# Patient Record
Sex: Female | Born: 1955 | Race: White | Hispanic: No | Marital: Single | Smoking: Never smoker
Health system: Southern US, Community
[De-identification: ages and names within clinical notes are randomized; demographics above are authoritative.]

## PROBLEM LIST (undated history)

## (undated) DIAGNOSIS — F79 Unspecified intellectual disabilities: Secondary | ICD-10-CM

## (undated) DIAGNOSIS — F209 Schizophrenia, unspecified: Secondary | ICD-10-CM

## (undated) DIAGNOSIS — I509 Heart failure, unspecified: Secondary | ICD-10-CM

## (undated) DIAGNOSIS — R1310 Dysphagia, unspecified: Secondary | ICD-10-CM

## (undated) DIAGNOSIS — E785 Hyperlipidemia, unspecified: Secondary | ICD-10-CM

## (undated) DIAGNOSIS — E669 Obesity, unspecified: Secondary | ICD-10-CM

## (undated) DIAGNOSIS — F419 Anxiety disorder, unspecified: Secondary | ICD-10-CM

## (undated) DIAGNOSIS — G219 Secondary parkinsonism, unspecified: Secondary | ICD-10-CM

## (undated) DIAGNOSIS — I10 Essential (primary) hypertension: Secondary | ICD-10-CM

## (undated) DIAGNOSIS — E559 Vitamin D deficiency, unspecified: Secondary | ICD-10-CM

## (undated) DIAGNOSIS — R569 Unspecified convulsions: Secondary | ICD-10-CM

## (undated) DIAGNOSIS — F0390 Unspecified dementia without behavioral disturbance: Secondary | ICD-10-CM

## (undated) HISTORY — DX: Unspecified convulsions: R56.9

## (undated) HISTORY — DX: Essential (primary) hypertension: I10

## (undated) HISTORY — DX: Obesity, unspecified: E66.9

## (undated) HISTORY — DX: Unspecified intellectual disabilities: F79

## (undated) HISTORY — DX: Unspecified dementia, unspecified severity, without behavioral disturbance, psychotic disturbance, mood disturbance, and anxiety: F03.90

## (undated) HISTORY — DX: Vitamin D deficiency, unspecified: E55.9

## (undated) HISTORY — DX: Anxiety disorder, unspecified: F41.9

## (undated) HISTORY — DX: Hyperlipidemia, unspecified: E78.5

## (undated) HISTORY — DX: Secondary parkinsonism, unspecified: G21.9

---

## 2003-04-29 ENCOUNTER — Ambulatory Visit (HOSPITAL_COMMUNITY): Admission: RE | Admit: 2003-04-29 | Discharge: 2003-04-29 | Payer: Self-pay | Admitting: Internal Medicine

## 2003-04-29 ENCOUNTER — Encounter: Payer: Self-pay | Admitting: Internal Medicine

## 2003-05-27 ENCOUNTER — Encounter: Payer: Self-pay | Admitting: Orthopedic Surgery

## 2003-05-27 ENCOUNTER — Ambulatory Visit: Admission: RE | Admit: 2003-05-27 | Discharge: 2003-05-27 | Payer: Self-pay | Admitting: Orthopedic Surgery

## 2007-12-08 ENCOUNTER — Emergency Department (HOSPITAL_COMMUNITY): Admission: EM | Admit: 2007-12-08 | Discharge: 2007-12-08 | Payer: Self-pay | Admitting: Emergency Medicine

## 2009-11-08 ENCOUNTER — Ambulatory Visit (HOSPITAL_COMMUNITY)
Admission: RE | Admit: 2009-11-08 | Discharge: 2009-11-08 | Payer: Self-pay | Source: Home / Self Care | Admitting: Internal Medicine

## 2011-06-17 LAB — BASIC METABOLIC PANEL
BUN: 13
CO2: 28
Chloride: 110
Creatinine, Ser: 0.52
Glucose, Bld: 104 — ABNORMAL HIGH

## 2011-06-17 LAB — URINE MICROSCOPIC-ADD ON

## 2011-06-17 LAB — URINALYSIS, ROUTINE W REFLEX MICROSCOPIC
Glucose, UA: NEGATIVE
Leukocytes, UA: NEGATIVE
Specific Gravity, Urine: >1.03 — ABNORMAL HIGH
Urobilinogen, UA: 0.2

## 2011-06-17 LAB — RAPID URINE DRUG SCREEN, HOSP PERFORMED
Barbiturates: POSITIVE — AB
Benzodiazepines: NOT DETECTED
Cocaine: NOT DETECTED
Opiates: NOT DETECTED

## 2011-06-17 LAB — DIFFERENTIAL
Basophils Relative: 1
Eosinophils Absolute: 0
Eosinophils Relative: 0
Neutrophils Relative %: 57

## 2011-06-17 LAB — ACETAMINOPHEN LEVEL: Acetaminophen (Tylenol), Serum: <10 — ABNORMAL LOW

## 2011-06-17 LAB — PHENOBARBITAL LEVEL: Phenobarbital: 34

## 2011-06-17 LAB — CBC
HCT: 37.6
MCHC: 35.4
MCV: 86.9
Platelets: 289

## 2014-01-06 ENCOUNTER — Encounter: Payer: Self-pay | Admitting: Adult Health

## 2014-01-06 ENCOUNTER — Ambulatory Visit (INDEPENDENT_AMBULATORY_CARE_PROVIDER_SITE_OTHER): Payer: PRIVATE HEALTH INSURANCE | Admitting: Adult Health

## 2014-01-06 VITALS — BP 120/82 | Ht 63.0 in | Wt 248.0 lb

## 2014-01-06 DIAGNOSIS — F79 Unspecified intellectual disabilities: Secondary | ICD-10-CM | POA: Insufficient documentation

## 2014-01-06 DIAGNOSIS — Z01419 Encounter for gynecological examination (general) (routine) without abnormal findings: Secondary | ICD-10-CM

## 2014-01-06 DIAGNOSIS — Z0000 Encounter for general adult medical examination without abnormal findings: Secondary | ICD-10-CM

## 2014-01-06 NOTE — Progress Notes (Signed)
Subjective:     Patient ID: Christine EagleDebra L Huisman, female   DOB: 11-16-1955, 58 y.o.   MRN: [id number]  HPI Stanton KidneyDebra is a 58 year old white female, single, who lives in [facility], she is here for a pap and physical.But she refused to undress and got loud and agitated with her caregiver.She had a mammogram 01/03/14, it took 2 visits to get it.She voices no complaints, except will not undress.  Review of Systems See HPI Reviewed past medical,surgical, social and family history. Reviewed medications and allergies, as best we could.     Objective:   Physical Exam BP 120/82  Ht 5\' 3"  (1.6 m)  Wt 248 lb (112.492 kg)  BMI 43.94 kg/m2   Skin warm and dry. Neck: mid line trachea, normal thyroid. Lungs: clear to ausculation bilaterally. Cardiovascular: regular rate and rhythm.Abdomen soft non tender and obese no HSM noted. I told caregiver I would not make her undress, will send 3 hemoccult cards home to do, and if she needs colonoscopy and sedated for that I could do pap then if PCP desires.  Assessment:     Limited physical exam Mental retardation     Plan:     Send 3 hemoccult cards home with instruction to return when complete Will be glad to see prn but am doubtful she will allow pelvic.

## 2014-01-06 NOTE — Patient Instructions (Signed)
Will be glad to see again but I doubt she will let a pelvic be preformed.

## 2014-02-16 ENCOUNTER — Ambulatory Visit: Payer: Medicare Other | Attending: Internal Medicine | Admitting: Occupational Therapy

## 2014-02-16 DIAGNOSIS — IMO0001 Reserved for inherently not codable concepts without codable children: Secondary | ICD-10-CM | POA: Diagnosis not present

## 2014-02-16 DIAGNOSIS — F79 Unspecified intellectual disabilities: Secondary | ICD-10-CM | POA: Insufficient documentation

## 2014-05-03 ENCOUNTER — Emergency Department (HOSPITAL_COMMUNITY): Payer: PRIVATE HEALTH INSURANCE

## 2014-05-03 ENCOUNTER — Inpatient Hospital Stay (HOSPITAL_COMMUNITY)
Admission: EM | Admit: 2014-05-03 | Discharge: 2014-05-05 | DRG: 101 | Disposition: A | Discharge status: Home / Self Care | Payer: PRIVATE HEALTH INSURANCE | Attending: Internal Medicine | Admitting: Internal Medicine

## 2014-05-03 ENCOUNTER — Encounter (HOSPITAL_COMMUNITY): Payer: Self-pay | Admitting: Emergency Medicine

## 2014-05-03 ENCOUNTER — Other Ambulatory Visit (HOSPITAL_COMMUNITY): Payer: Medicaid Other

## 2014-05-03 DIAGNOSIS — Z6841 Body Mass Index (BMI) 40.0 and over, adult: Secondary | ICD-10-CM | POA: Diagnosis not present

## 2014-05-03 DIAGNOSIS — IMO0002 Reserved for concepts with insufficient information to code with codable children: Secondary | ICD-10-CM | POA: Diagnosis present

## 2014-05-03 DIAGNOSIS — G40909 Epilepsy, unspecified, not intractable, without status epilepticus: Secondary | ICD-10-CM | POA: Diagnosis present

## 2014-05-03 DIAGNOSIS — F79 Unspecified intellectual disabilities: Secondary | ICD-10-CM | POA: Diagnosis present

## 2014-05-03 DIAGNOSIS — Z8249 Family history of ischemic heart disease and other diseases of the circulatory system: Secondary | ICD-10-CM | POA: Diagnosis not present

## 2014-05-03 DIAGNOSIS — I10 Essential (primary) hypertension: Secondary | ICD-10-CM | POA: Diagnosis present

## 2014-05-03 DIAGNOSIS — R569 Unspecified convulsions: Secondary | ICD-10-CM | POA: Diagnosis present

## 2014-05-03 LAB — URINALYSIS, ROUTINE W REFLEX MICROSCOPIC
BILIRUBIN URINE: NEGATIVE
GLUCOSE, UA: NEGATIVE mg/dL
KETONES UR: NEGATIVE mg/dL
Leukocytes, UA: NEGATIVE
Nitrite: NEGATIVE
PH: 6 (ref 5.0–8.0)
Protein, ur: 100 mg/dL — AB
Specific Gravity, Urine: 1.025 (ref 1.005–1.030)
Urobilinogen, UA: 0.2 mg/dL (ref 0.0–1.0)

## 2014-05-03 LAB — COMPREHENSIVE METABOLIC PANEL
ALBUMIN: 3.5 g/dL (ref 3.5–5.2)
ALK PHOS: 54 U/L (ref 39–117)
ALT: 9 U/L (ref 0–35)
AST: 13 U/L (ref 0–37)
Anion gap: 13 (ref 5–15)
BILIRUBIN TOTAL: 0.2 mg/dL — AB (ref 0.3–1.2)
BUN: 10 mg/dL (ref 6–23)
CHLORIDE: 105 meq/L (ref 96–112)
CO2: 26 mEq/L (ref 19–32)
Calcium: 9.3 mg/dL (ref 8.4–10.5)
Creatinine, Ser: 0.55 mg/dL (ref 0.50–1.10)
GFR calc Af Amer: >90 mL/min (ref >90)
GFR calc non Af Amer: >90 mL/min (ref >90)
Glucose, Bld: 89 mg/dL (ref 70–99)
POTASSIUM: 3.9 meq/L (ref 3.7–5.3)
Sodium: 144 mEq/L (ref 137–147)
Total Protein: 6.8 g/dL (ref 6.0–8.3)

## 2014-05-03 LAB — URINE MICROSCOPIC-ADD ON

## 2014-05-03 LAB — CBC WITH DIFFERENTIAL/PLATELET
BASOS ABS: 0 10*3/uL (ref 0.0–0.1)
BASOS PCT: 0 % (ref 0–1)
Eosinophils Absolute: 0 10*3/uL (ref 0.0–0.7)
Eosinophils Relative: 0 % (ref 0–5)
HCT: 36.2 % (ref 36.0–46.0)
HEMOGLOBIN: 12.4 g/dL (ref 12.0–15.0)
Lymphocytes Relative: 30 % (ref 12–46)
Lymphs Abs: 1.8 10*3/uL (ref 0.7–4.0)
MCH: 29.9 pg (ref 26.0–34.0)
MCHC: 34.3 g/dL (ref 30.0–36.0)
MCV: 87.2 fL (ref 78.0–100.0)
MONOS PCT: 6 % (ref 3–12)
Monocytes Absolute: 0.3 10*3/uL (ref 0.1–1.0)
NEUTROS ABS: 3.7 10*3/uL (ref 1.7–7.7)
NEUTROS PCT: 64 % (ref 43–77)
Platelets: 286 10*3/uL (ref 150–400)
RBC: 4.15 MIL/uL (ref 3.87–5.11)
RDW: 12.3 % (ref 11.5–15.5)
WBC: 5.8 10*3/uL (ref 4.0–10.5)

## 2014-05-03 LAB — AMMONIA: Ammonia: 27 umol/L (ref 11–60)

## 2014-05-03 LAB — MAGNESIUM: Magnesium: 1.9 mg/dL (ref 1.5–2.5)

## 2014-05-03 LAB — PHENOBARBITAL LEVEL: Phenobarbital: 27.2 ug/mL (ref 15.0–40.0)

## 2014-05-03 LAB — VALPROIC ACID LEVEL: Valproic Acid Lvl: <10 ug/mL — ABNORMAL LOW (ref 50.0–100.0)

## 2014-05-03 MED ORDER — BISACODYL 10 MG RE SUPP: 10.0000 mg | Freq: Every day | RECTAL | Status: DC | PRN

## 2014-05-03 MED ORDER — ENOXAPARIN SODIUM 40 MG/0.4ML ~~LOC~~ SOLN
40.0000 mg | SUBCUTANEOUS | Status: DC
  Administered 2014-05-03: 40 mg via SUBCUTANEOUS
  Filled 2014-05-03: qty 0.4

## 2014-05-03 MED ORDER — ONDANSETRON HCL 4 MG/2ML IJ SOLN: 4.0000 mg | Freq: Four times a day (QID) | INTRAMUSCULAR | Status: DC | PRN

## 2014-05-03 MED ORDER — ACETAMINOPHEN 325 MG PO TABS
650.0000 mg | ORAL_TABLET | Freq: Four times a day (QID) | ORAL | Status: DC | PRN
  Administered 2014-05-03 – 2014-05-04 (×2): 650 mg via ORAL
  Filled 2014-05-03 (×2): qty 2

## 2014-05-03 MED ORDER — SENNA 8.6 MG PO TABS
1.0000 | ORAL_TABLET | Freq: Two times a day (BID) | ORAL | Status: DC
  Administered 2014-05-03 – 2014-05-05 (×4): 8.6 mg via ORAL
  Filled 2014-05-03 (×4): qty 1

## 2014-05-03 MED ORDER — SIMVASTATIN 20 MG PO TABS
20.0000 mg | ORAL_TABLET | Freq: Every day | ORAL | Status: DC
  Administered 2014-05-03 – 2014-05-05 (×3): 20 mg via ORAL
  Filled 2014-05-03: qty 1
  Filled 2014-05-03: qty 2
  Filled 2014-05-03: qty 1

## 2014-05-03 MED ORDER — TRANDOLAPRIL 2 MG PO TABS
1.0000 mg | ORAL_TABLET | Freq: Every day | ORAL | Status: DC
  Administered 2014-05-03 – 2014-05-05 (×3): 1 mg via ORAL
  Filled 2014-05-03 (×3): qty 1

## 2014-05-03 MED ORDER — LEVETIRACETAM 500 MG PO TABS
500.0000 mg | ORAL_TABLET | Freq: Two times a day (BID) | ORAL | Status: DC
  Administered 2014-05-03 – 2014-05-05 (×4): 500 mg via ORAL
  Filled 2014-05-03 (×4): qty 1

## 2014-05-03 MED ORDER — ALUM & MAG HYDROXIDE-SIMETH 200-200-20 MG/5ML PO SUSP: 30.0000 mL | Freq: Four times a day (QID) | ORAL | Status: DC | PRN

## 2014-05-03 MED ORDER — PHENOBARBITAL 32.4 MG PO TABS
194.4000 mg | ORAL_TABLET | ORAL | Status: DC
  Administered 2014-05-03: 194.4 mg via ORAL
  Filled 2014-05-03 (×2): qty 6

## 2014-05-03 MED ORDER — OXYBUTYNIN CHLORIDE 5 MG PO TABS
5.0000 mg | ORAL_TABLET | Freq: Every day | ORAL | Status: DC
  Administered 2014-05-03 – 2014-05-05 (×3): 5 mg via ORAL
  Filled 2014-05-03 (×3): qty 1

## 2014-05-03 MED ORDER — SODIUM CHLORIDE 0.9 % IV SOLN
INTRAVENOUS | Status: AC
  Administered 2014-05-03: 17:00:00 via INTRAVENOUS

## 2014-05-03 MED ORDER — SODIUM CHLORIDE 0.9 % IJ SOLN
3.0000 mL | Freq: Two times a day (BID) | INTRAMUSCULAR | Status: DC
  Administered 2014-05-03 – 2014-05-05 (×2): 3 mL via INTRAVENOUS

## 2014-05-03 MED ORDER — LORAZEPAM 2 MG/ML IJ SOLN
1.0000 mg | INTRAMUSCULAR | Status: DC | PRN
  Administered 2014-05-04: 1 mg via INTRAVENOUS
  Filled 2014-05-03 (×2): qty 1

## 2014-05-03 MED ORDER — ACETAMINOPHEN 650 MG RE SUPP: 650.0000 mg | Freq: Four times a day (QID) | RECTAL | Status: DC | PRN

## 2014-05-03 MED ORDER — PHENOBARBITAL 32.4 MG PO TABS
129.6000 mg | ORAL_TABLET | ORAL | Status: DC
  Administered 2014-05-04: 129.6 mg via ORAL
  Filled 2014-05-03: qty 4

## 2014-05-03 MED ORDER — ONDANSETRON HCL 4 MG PO TABS: 4.0000 mg | ORAL_TABLET | Freq: Four times a day (QID) | ORAL | Status: DC | PRN

## 2014-05-03 MED ORDER — LEVETIRACETAM IN NACL 1000 MG/100ML IV SOLN
1000.0000 mg | Freq: Once | INTRAVENOUS | Status: AC
  Administered 2014-05-03: 1000 mg via INTRAVENOUS
  Filled 2014-05-03: qty 100

## 2014-05-03 MED ORDER — QUETIAPINE FUMARATE 25 MG PO TABS
50.0000 mg | ORAL_TABLET | Freq: Two times a day (BID) | ORAL | Status: DC
  Administered 2014-05-03 – 2014-05-05 (×4): 50 mg via ORAL
  Filled 2014-05-03 (×4): qty 2

## 2014-05-03 NOTE — ED Notes (Signed)
Report given to floor, pt. To go up in 15 minutes after finishing Keppra.

## 2014-05-03 NOTE — ED Notes (Signed)
Several attempts to call [facility] at [phone], also contact number listed in pt's chart at [phone] with no answer, was able to leave voice mail with nurse department at [facility].

## 2014-05-03 NOTE — ED Notes (Signed)
Lab at bedside for blood work.

## 2014-05-03 NOTE — ED Notes (Signed)
Pt is a resident of [facility] who was on the way to dr [facility] when pt had 3 seizures, per ems report pt had a seizure yesterday and was seen [facility], on arrival to er pt slow to answer questions, unsure of pt;s baseline, pt has hx of MR,

## 2014-05-03 NOTE — ED Notes (Signed)
Dr Zammit at bedside,  

## 2014-05-03 NOTE — ED Notes (Signed)
Pt alert, answering questions, update given,

## 2014-05-03 NOTE — H&P (Signed)
[facility] History and Physical  Christine Pena JXB:[id number] DOB: [birth year] DOA: 05/03/2014  Referring physician:  PCP: Colette Ribas, MD   Chief Complaint: seizure  HPI: Christine Pena is a 58 y.o. female with a past medical history that includes mental retardation, hypertension, seizure living in a group home, presents to the emergency department with the chief complaint of seizure. Information is obtained from the caregiver who is at the group home. Reportedly patient was on her way to [facility] for an appointment via a transportation service when she had 3 seizures that were witnessed by the driver. Driver describes patient as becoming lethargic with purple lips and drooling. Each episode lasted only 30 seconds to a minute. Caregiver reports patient had a seizure yesterday and was sent to [facility] emergency department and then discharged home. According to the caregiver there was a medication adjustment recently do to a elevated ammonia level. Family reports patient has not had a seizure since 2013. There is no report of recent illness no fever chills nausea vomiting diarrhea. No complaints of headache visual disturbances gait disturbances recent falls. Currently she's been eating and drinking her normal amount has been no unintentional weight loss.  Initial workup in the emergency department includes complete metabolic panel is unremarkable, complete blood count is unremarkable, CT of the head without acute abnormality chest x-ray with no active disease. Her phenobarbital level is 27.2. In the emergency department her vital signs are stable she is afebrile and not hypoxic appearing.  Review of Systems:  10 point review of systems completed and all systems are negative except as indicated in the history of present illness.   Past Medical History  Diagnosis Date  . Hypertension   . Seizures   . Mental retardation    History reviewed. No pertinent past surgical  history. Social History:  reports that she has never smoked. She has never used smokeless tobacco. She reports that she does not drink alcohol or use illicit drugs. History mental retardation. She attempts to make her wants and needs known. She can follow simple commands. She's been living in a group home the current group home since January 2015 Allergies  Allergen Reactions  . Haldol [Haloperidol Lactate]     Unknown reaction.    Family History  Problem Relation Age of Onset  . Heart disease Mother      Prior to Admission medications   Medication Sig Start Date End Date Taking? Authorizing Provider  moexipril (UNIVASC) 7.5 MG tablet Take 7.5 mg by mouth daily.  12/30/13  Yes Historical Provider, MD  oxybutynin (DITROPAN) 5 MG tablet Take 5 mg by mouth daily.  12/30/13  Yes Historical Provider, MD  PHENobarbital (LUMINAL) 64.8 MG tablet Take 129.6-194.4 mg by mouth See admin instructions. Takes 2 tablets at bedtime, alternating with 3 tablets the next night; repeat. 12/30/13  Yes Historical Provider, MD  QUEtiapine (SEROQUEL) 50 MG tablet Take 50 mg by mouth 2 (two) times daily.  12/30/13  Yes Historical Provider, MD  simvastatin (ZOCOR) 20 MG tablet Take 20 mg by mouth daily.  12/30/13  Yes Historical Provider, MD  Vitamin D, Ergocalciferol, (DRISDOL) 50000 UNITS CAPS capsule Take 50,000 Units by mouth every 7 (seven) days. Takes on Saturdays.   Yes Historical Provider, MD   Physical Exam: Filed Vitals:   05/03/14 0915 05/03/14 1015 05/03/14 1138 05/03/14 1238  BP: 139/84 101/73 129/78 140/79  Pulse: 100 79 81 79  Temp: 98.5 F (36.9 C)  TempSrc: Oral     Resp: 23 26 22 16   SpO2: 93%  95% 98%    Wt Readings from Last 3 Encounters:  01/06/14 112.492 kg (248 lb)    General:  Appears calm and comfortable Eyes: PERRL, normal lids, irises & conjunctiva ENT: Ears clear nose without drainage oropharynx without erythema or exudate. Mucous membranes of her mouth are pink but dry Neck: no  LAD, masses or thyromegaly Cardiovascular: RRR, no m/r/g. Trace to 1+ lower extremity edema nonpitting Telemetry: SR, no arrhythmias  Respiratory: CTA bilaterally, no w/r/r. Normal respiratory effort. Abdomen: soft, ntnd positive bowel sounds throughout no mass organomegaly noted Skin: no rash or induration seen on limited exam Musculoskeletal: grossly normal tone BUE/BLE  Neurologic: Follow simple commands speech somewhat unclear moves all extremities           Labs on Admission:  Basic Metabolic Panel:  Recent Labs Lab 05/03/14 0932  NA 144  K 3.9  CL 105  CO2 26  GLUCOSE 89  BUN 10  CREATININE 0.55  CALCIUM 9.3   Liver Function Tests:  Recent Labs Lab 05/03/14 0932  AST 13  ALT 9  ALKPHOS 54  BILITOT 0.2*  PROT 6.8  ALBUMIN 3.5   No results found for this basename: LIPASE, AMYLASE,  in the last 168 hours No results found for this basename: AMMONIA,  in the last 168 hours CBC:  Recent Labs Lab 05/03/14 0932  WBC 5.8  NEUTROABS 3.7  HGB 12.4  HCT 36.2  MCV 87.2  PLT 286   Cardiac Enzymes: No results found for this basename: CKTOTAL, CKMB, CKMBINDEX, TROPONINI,  in the last 168 hours  BNP (last 3 results) No results found for this basename: PROBNP,  in the last 8760 hours CBG: No results found for this basename: GLUCAP,  in the last 168 hours  Radiological Exams on Admission: Ct Head Wo Contrast  05/03/2014   CLINICAL DATA:  Seizure.  Altered mental status.  EXAM: CT HEAD WITHOUT CONTRAST  TECHNIQUE: Contiguous axial images were obtained from the base of the skull through the vertex without intravenous contrast.  COMPARISON:  Head CT scan 05/02/2014.  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 No evidence of acute intracranial abnormality including infarct, hemorrhage, mass lesion, mass effect, midline shift or abnormal extra-axial fluid collection is identified. There is no hydrocephalus or pneumocephalus. Imaged paranasal sinuses  and mastoid air cells are clear.  IMPRESSION: No acute abnormality.  Cerebellar atrophy.   Electronically Signed   By: Drusilla Kannerhomas  Dalessio M.D.   On: 05/03/2014 10:17   Dg Chest Portable 1 View  05/03/2014   CLINICAL DATA:  Seizures.  EXAM: PORTABLE CHEST - 1 VIEW  COMPARISON:  None.  FINDINGS: The lungs are clear. Heart size is upper normal. No pneumothorax or pleural effusion. No focal bony abnormality.  IMPRESSION: No acute disease.   Electronically Signed   By: Drusilla Kannerhomas  Dalessio M.D.   On: 05/03/2014 09:53    EKG:   Assessment/Plan Principal Problem:   Seizure: History of same. May be related to recent medication adjustment. Gout Prozac acid level less than 10.0. Phenobarbital level within the limits of normal. Will admit to telemetry. Will place on seizure precautions. She was given a loading dose of Keppra in the emergency department we will continue this twice a day. Will continue home phenobarbital dose Provide when necessary Ativan for any seizure activity Have requested an EEG and a neurology consult. Active Problems:  Mental retardation: Appears to be stable at baseline. Continue home medications    Hypertension: Fair control. Will continue her home ACE inhibitor. Monitor   Dr Jerre Simon neurology  Code Status: full DVT Prophylaxis: Family Communication: caregiver at bedside Disposition Plan: back to group home when ready  Time spent: 65 minutes  [facility] Pager [phone]  **Disclaimer: This note may have been dictated with voice recognition software. Similar sounding words can inadvertently be transcribed and this note may contain transcription errors which may not have been corrected upon publication of note.**

## 2014-05-03 NOTE — ED Provider Notes (Signed)
CSN: [id number]     Arrival date & time 05/03/14  0909 History  This chart was scribed for Christine LennertJoseph L Domingo Fuson, MD by Christine Pena, ED Scribe. This patient was seen in room APA02/APA02 and the patient's care was started 9:18 AM.    Chief Complaint  Patient presents with  . Seizures    Patient is a 58 y.o. female presenting with seizures. The history is provided by the EMS personnel. The history is limited by the condition of the patient. No language interpreter was used.  Seizures Seizure activity on arrival: no   Seizure type:  Unable to specify Initial focality:  Unable to specify Severity:  Moderate Timing:  Clustered Number of seizures this episode:  3 Progression:  Resolved History of seizures: yes     LEVEL 5 CAVEAT- SEIZURES, MENTAL RETARDATION HPI Comments: Christine Pena is a 58 y.o. female with past medical history of MR, seizures who presents from [facility] to the Emergency Department complaining of 3 seizures that occurred PTA. [place] [facility] [employer] when she had the seizures, which were witnessed by the driver. EMS was called to the scene and brought here. Per nurse, [facility] was seen [facility] for the same symptoms.    Past Medical History  Diagnosis Date  . Hypertension   . Seizures   . Mental retardation    History reviewed. No pertinent past surgical history. Family History  Problem Relation Age of Onset  . Heart disease Mother    History  Substance Use Topics  . Smoking status: Never Smoker   . Smokeless tobacco: Never Used  . Alcohol Use: No   OB History   Grav Para Term Preterm Abortions TAB SAB Ect Mult Living                 Review of Systems  Unable to perform ROS: Other      Allergies  Review of patient's allergies indicates no known allergies.  Home Medications   Prior to Admission medications   Medication Sig Start Date End Date Taking? Authorizing Provider   divalproex (DEPAKOTE) 250 MG DR tablet  12/30/13   Historical Provider, MD  furosemide (LASIX) 20 MG tablet  12/30/13   Historical Provider, MD  LORazepam (ATIVAN) 1 MG tablet  12/30/13   Historical Provider, MD  moexipril (UNIVASC) 7.5 MG tablet  12/30/13   Historical Provider, MD  oxybutynin (DITROPAN) 5 MG tablet  12/30/13   Historical Provider, MD  PHENobarbital (LUMINAL) 64.8 MG tablet  12/30/13   Historical Provider, MD  QUEtiapine (SEROQUEL) 50 MG tablet  12/30/13   Historical Provider, MD  simvastatin (ZOCOR) 20 MG tablet  12/30/13   Historical Provider, MD   BP 139/84  Pulse 100  Temp(Src) 98.5 F (36.9 C) (Oral)  Resp 23  SpO2 93% Physical Exam  Nursing note and vitals reviewed. Constitutional:  Lethargic.   HENT:  Head: Normocephalic.  Eyes: Conjunctivae and EOM are normal. No scleral icterus.  Neck: Neck supple. No thyromegaly present.  Cardiovascular: Normal rate and regular rhythm.  Exam reveals no gallop and no friction rub.   No murmur heard. Pulmonary/Chest: No stridor. She has no wheezes. She has no rales. She exhibits no tenderness.  Abdominal: She exhibits no distension. There is no tenderness. There is no rebound.  Musculoskeletal: Normal range of motion. She exhibits edema.  1+ edema to ankles bilaterally. Moves all extremities minimally.   Lymphadenopathy:  She has no cervical adenopathy.  Neurological: She exhibits normal muscle tone. Coordination normal.  Will follows commands and reacts to verbal stimuli   Skin: No rash noted. No erythema.  Psychiatric: She has a normal mood and affect. Her behavior is normal.    ED Course  Procedures (including critical care time)  DIAGNOSTIC STUDIES: Oxygen Saturation is 93% on RA, adequate by my interpretation.    COORDINATION OF CARE: 9:23 AM Will order head CT, CXR, and lab work.   12:14 PM Spoke with patient's caregiver who states patient was slowly taken off of Depakote due to elevated ammonia levels. Patient was  not started on a replacement medication and had a seizure yesterday in addition to the 3 today. Per caregiver, patient's last seizure was 2 years ago.   Labs Review Labs Reviewed - No data to display  Imaging Review No results found.   EKG Interpretation None      MDM   Final diagnoses:  None   Admit sz   The chart was scribed for me under my direct supervision.  I personally performed the history, physical, and medical decision making and all procedures in the evaluation of this patient.Christine Lennert, MD 05/03/14 1235

## 2014-05-03 NOTE — H&P (Signed)
Pt seen and examined with Ms. Vedia CofferBlack. Agree with assessment and plan as outlined above. Briefly, pt with a hx of mental retardation and known hx of seizures who [facility] with seizure activity after being weaned off Depakote secondary to elevated ammonia level. Pt remains therapeutic on phenytoin. Keppra started in ED. Agree with continuing scheduled keppra for now and ask Neurology to assist with med adjustment.

## 2014-05-03 NOTE — Consult Note (Signed)
[place] A. Merlene Laughter, MD     [email or web address]          Christine Pena is an 58 y.o. female.   ASSESSMENT/PLAN: 1. Resolved status epilepticus after the discontinuation of Depakote. The patient has been started on Keppra. Given her baseline cognitive impairment/mental retardation and Behavioral problem, she has a higher risk of developing behavioral problems with the Keppra. We will have to observe she also does with this. In meantime, he will continue with the [place] for now. If she cannot tolerate the Keppra she may do better with a more broad spectrum medication that also can stabilize her mood. Lamictal would be a good option. We will continue with the phenobarbital. An EEG will be obtained.  2. Marked mental retardation at baseline. Continue with her baseline psychotropic medications.  3. Depakote-induced elevation of ammonia level. The Depakote has been discontinued.  This is a 58 year old white female who has a baseline history of epilepsy. She apparently has been well controlled with no seizures in 2013. The chart. She stayed at the group home facility. It appears that the patient Depakote was discontinued after she presented with elevated ammonia level at [facility]. Unfortunately, she had a few seizures on admission today. She was loaded with Keppra and so far has tolerated the medication. She has marked cognitive impairment at baseline and therefore history cannot be obtained. It appears that she has she had a flurry of seizures while she was being transported for a regular appointment at [facility]. The patient has been somewhat irritable and pulled out her IV while hospitalized. She currently has a Actuary with her.  GENERAL: Obese female who seen up in the chair.   HEENT: Supple. Atraumatic normocephalic.   ABDOMEN: soft  EXTREMITIES: Significant nonpitting edema of the legs.   BACK: Normal.  SKIN: Normal by inspection.    MENTAL  STATUS: She is awake and alert. She has severe dysarthria. She speaks only 2 word sentences and stated that she is doing well. She does follow commands. Otherwise she cannot answer any questions.  CRANIAL NERVES: Pupils are equal, round and reactive to light and accommodation; extra ocular movements are full, there is no significant nystagmus; visual fields are full; upper and lower facial muscles are normal in strength and symmetric, there is no flattening of the nasolabial folds; tongue is midline; uvula is midline; shoulder elevation is normal. Visual fields somewhat limited on examination but appears to be full.  MOTOR: Exact strength is not possible because of cognitive issues but she has at least 4+ in the upper extremities and antigravity in the legs.  COORDINATION: No tremors are noted. There is no dysmetria.  REFLEXES: Deep tendon reflexes are symmetrical and normal. Babinski reflexes are flexor bilaterally.   SENSATION: Normal to Pain.      Past Medical History  Diagnosis Date  . Hypertension   . Seizures   . Mental retardation     History reviewed. No pertinent past surgical history.  Family History  Problem Relation Age of Onset  . Heart disease Mother     Social History:  reports that she has never smoked. She has never used smokeless tobacco. She reports that she does not drink alcohol or use illicit drugs.  Allergies:  Allergies  Allergen Reactions  . Haldol [Haloperidol Lactate]     Unknown reaction.    Medications: Prior to Admission medications   Medication Sig Start Date End Date Taking? Authorizing Provider  moexipril (UNIVASC) 7.5  MG tablet Take 7.5 mg by mouth daily.  12/30/13  Yes Historical Provider, MD  oxybutynin (DITROPAN) 5 MG tablet Take 5 mg by mouth daily.  12/30/13  Yes Historical Provider, MD  PHENobarbital (LUMINAL) 64.8 MG tablet Take 129.6-194.4 mg by mouth See admin instructions. Takes 2 tablets at bedtime, alternating with 3 tablets the  next night; repeat. 12/30/13  Yes Historical Provider, MD  QUEtiapine (SEROQUEL) 50 MG tablet Take 50 mg by mouth 2 (two) times daily.  12/30/13  Yes Historical Provider, MD  simvastatin (ZOCOR) 20 MG tablet Take 20 mg by mouth daily.  12/30/13  Yes Historical Provider, MD  Vitamin D, Ergocalciferol, (DRISDOL) 50000 UNITS CAPS capsule Take 50,000 Units by mouth every 7 (seven) days. Takes on Saturdays.   Yes Historical Provider, MD    Scheduled Meds: . enoxaparin (LOVENOX) injection  40 mg Subcutaneous Q24H  . levETIRAcetam  500 mg Oral BID  . oxybutynin  5 mg Oral Daily  . [START ON 05/04/2014] phenobarbital  129.6 mg Oral Q48H  . PHENobarbital  194.4 mg Oral Q48H  . QUEtiapine  50 mg Oral BID  . senna  1 tablet Oral BID  . simvastatin  20 mg Oral Daily  . sodium chloride  3 mL Intravenous Q12H  . trandolapril  1 mg Oral Daily   Continuous Infusions: . sodium chloride 50 mL/hr at 05/03/14 1705   PRN Meds:.acetaminophen, acetaminophen, alum & mag hydroxide-simeth, bisacodyl, LORazepam, ondansetron (ZOFRAN) IV, ondansetron   Blood pressure 153/64, pulse 74, temperature 97.9 F (36.6 C), temperature source Oral, resp. rate 18, SpO2 100.00%.   Results for orders placed during the hospital encounter of 05/03/14 (from the past 48 hour(s))  MAGNESIUM     Status: None   Collection Time    05/03/14  9:23 AM      Result Value Ref Range   Magnesium 1.9  1.5 - 2.5 mg/dL  CBC WITH DIFFERENTIAL     Status: None   Collection Time    05/03/14  9:32 AM      Result Value Ref Range   WBC 5.8  4.0 - 10.5 K/uL   RBC 4.15  3.87 - 5.11 MIL/uL   Hemoglobin 12.4  12.0 - 15.0 g/dL   HCT 36.2  36.0 - 46.0 %   MCV 87.2  78.0 - 100.0 fL   MCH 29.9  26.0 - 34.0 pg   MCHC 34.3  30.0 - 36.0 g/dL   RDW 12.3  11.5 - 15.5 %   Platelets 286  150 - 400 K/uL   Neutrophils Relative % 64  43 - 77 %   Neutro Abs 3.7  1.7 - 7.7 K/uL   Lymphocytes Relative 30  12 - 46 %   Lymphs Abs 1.8  0.7 - 4.0 K/uL   Monocytes  Relative 6  3 - 12 %   Monocytes Absolute 0.3  0.1 - 1.0 K/uL   Eosinophils Relative 0  0 - 5 %   Eosinophils Absolute 0.0  0.0 - 0.7 K/uL   Basophils Relative 0  0 - 1 %   Basophils Absolute 0.0  0.0 - 0.1 K/uL  COMPREHENSIVE METABOLIC PANEL     Status: Abnormal   Collection Time    05/03/14  9:32 AM      Result Value Ref Range   Sodium 144  137 - 147 mEq/L   Potassium 3.9  3.7 - 5.3 mEq/L   Chloride 105  96 - 112 mEq/L   CO2  26  19 - 32 mEq/L   Glucose, Bld 89  70 - 99 mg/dL   BUN 10  6 - 23 mg/dL   Creatinine, Ser 0.55  0.50 - 1.10 mg/dL   Calcium 9.3  8.4 - 10.5 mg/dL   Total Protein 6.8  6.0 - 8.3 g/dL   Albumin 3.5  3.5 - 5.2 g/dL   AST 13  0 - 37 U/L   ALT 9  0 - 35 U/L   Alkaline Phosphatase 54  39 - 117 U/L   Total Bilirubin 0.2 (*) 0.3 - 1.2 mg/dL   GFR calc non Af Amer >90  >90 mL/min   GFR calc Af Amer >90  >90 mL/min   Comment: (NOTE)     The eGFR has been calculated using the CKD EPI equation.     This calculation has not been validated in all clinical situations.     eGFR's persistently <90 mL/min signify possible Chronic Kidney     Disease.   Anion gap 13  5 - 15  PHENOBARBITAL LEVEL     Status: None   Collection Time    05/03/14  9:32 AM      Result Value Ref Range   Phenobarbital 27.2  15.0 - 40.0 ug/mL  VALPROIC ACID LEVEL     Status: Abnormal   Collection Time    05/03/14  9:32 AM      Result Value Ref Range   Valproic Acid Lvl <10.0 (*) 50.0 - 100.0 ug/mL  URINALYSIS, ROUTINE W REFLEX MICROSCOPIC     Status: Abnormal   Collection Time    05/03/14  9:46 AM      Result Value Ref Range   Color, Urine YELLOW  YELLOW   APPearance CLEAR  CLEAR   Specific Gravity, Urine 1.025  1.005 - 1.030   pH 6.0  5.0 - 8.0   Glucose, UA NEGATIVE  NEGATIVE mg/dL   Hgb urine dipstick MODERATE (*) NEGATIVE   Bilirubin Urine NEGATIVE  NEGATIVE   Ketones, ur NEGATIVE  NEGATIVE mg/dL   Protein, ur 100 (*) NEGATIVE mg/dL   Urobilinogen, UA 0.2  0.0 - 1.0 mg/dL    Nitrite NEGATIVE  NEGATIVE   Leukocytes, UA NEGATIVE  NEGATIVE  URINE MICROSCOPIC-ADD ON     Status: Abnormal   Collection Time    05/03/14  9:46 AM      Result Value Ref Range   Squamous Epithelial / LPF FEW (*) RARE   RBC / HPF 7-10  <3 RBC/hpf   Bacteria, UA FEW (*) RARE  AMMONIA     Status: None   Collection Time    05/03/14  4:13 PM      Result Value Ref Range   Ammonia 27  11 - 60 umol/L    Ct Head Wo Contrast  05/03/2014   CLINICAL DATA:  Seizure.  Altered mental status.  EXAM: CT HEAD WITHOUT CONTRAST  TECHNIQUE: Contiguous axial images were obtained from the base of the skull through the vertex without intravenous contrast.  COMPARISON:  Head CT scan 05/02/2014.  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 No evidence of acute intracranial abnormality including infarct, hemorrhage, mass lesion, mass effect, midline shift or abnormal extra-axial fluid collection is identified. There is no hydrocephalus or pneumocephalus. Imaged paranasal sinuses and mastoid air cells are clear.  IMPRESSION: No acute abnormality.  Cerebellar atrophy.   Electronically Signed   By: Inge Rise M.D.   On: 05/03/2014  10:17   Dg Chest Portable 1 View  05/03/2014   CLINICAL DATA:  Seizures.  EXAM: PORTABLE CHEST - 1 VIEW  COMPARISON:  None.  FINDINGS: The lungs are clear. Heart size is upper normal. No pneumothorax or pleural effusion. No focal bony abnormality.  IMPRESSION: No acute disease.   Electronically Signed   By: Inge Rise M.D.   On: 05/03/2014 09:53        Johnney Scarlata A. Merlene Laughter, M.D.  Diplomate, Tax adviser of Psychiatry and Neurology ( Neurology). 05/03/2014, 7:36 PM

## 2014-05-03 NOTE — ED Notes (Signed)
Hx of seziures and MR - on the way to MD appt when had 3 witnessed seizures by driver.  [facility].  Pt alert at this time.

## 2014-05-04 ENCOUNTER — Other Ambulatory Visit (HOSPITAL_COMMUNITY): Payer: Medicaid Other

## 2014-05-04 LAB — TSH: TSH: 2.8 u[IU]/mL (ref 0.350–4.500)

## 2014-05-04 MED ORDER — LAMOTRIGINE 25 MG PO TABS
25.0000 mg | ORAL_TABLET | Freq: Two times a day (BID) | ORAL | Status: DC
  Administered 2014-05-04 – 2014-05-05 (×2): 25 mg via ORAL
  Filled 2014-05-04 (×4): qty 1

## 2014-05-04 MED ORDER — LAMOTRIGINE 25 MG PO TABS
ORAL_TABLET | ORAL | Status: AC
  Filled 2014-05-04: qty 1

## 2014-05-04 NOTE — Care Management Note (Signed)
    Page 1 of 1   05/05/2014     11:40:36 AM CARE MANAGEMENT NOTE 05/05/2014  Patient:  Christine Pena,Christine Pena   Account Number:  [id number]  Date Initiated:  05/04/2014  Documentation initiated by:  Anibal HendersonBOLDEN,Kadir Azucena  Subjective/Objective Assessment:   Admitted with seizures. Pt is from [facility], and will return there at d/c. CSW aware, and will facilitate the D/C     Action/Plan:   No CM needs identified at this time, but will follow   Anticipated DC Date:  05/05/2014   Anticipated DC Plan:  GROUP HOME      DC Planning Services  CM consult      Choice offered to / List presented to:             Status of service:  Completed, signed off Medicare Important Message given?   (If response is "NO", the following Medicare IM given date fields will be blank) Date Medicare IM given:   Medicare IM given by:   Date Additional Medicare IM given:   Additional Medicare IM given by:    Discharge Disposition:  REST HOME  Per UR Regulation:  Reviewed for med. necessity/level of care/duration of stay  If discussed at Long Length of Stay Meetings, dates discussed:    Comments:  05/05/14 1130 Anibal HendersonGeneva Davidlee Jeanbaptiste RN/CM  Pt D/C back to group home 05/04/14 1400 Anibal HendersonGeneva Rondall Radigan RN/CM

## 2014-05-04 NOTE — Clinical Social Work Note (Signed)
Patient is resident of [facility], [place].  [phone] Christine Birkenheadx32, Christine Pena.  [name], LCSW Clinical Social Worker [phone])

## 2014-05-04 NOTE — Progress Notes (Signed)
[facility] PROGRESS NOTE  Christine Pena ZOX:[id number] DOB: Feb 07, 1956 DOA: 05/03/2014 PCP: Colette Ribas, MD  Assessment/Plan: Principal Problem:  Seizure: History of same. May be related to recent medication adjustment. valproic acid level less than 10.0 on admission. Phenobarbital level within the limits of normal. Appreciate Dr Gerilyn Pilgrim assistance. Continue keppra and home phenobarbital dose. No further seizure activity. Per Dr Gerilyn Pilgrim may need to consider lamictal as Keppra may increase risk of behavioral problems.  Await EEG results. Ativan as needed for seizure  Active Problems:  Mental retardation: Appears to be stable at baseline. She does holler out and pulls at IV etc. Easily redirected. Sitter in room. Instructed sitter to engage patient in active way to help keep her calm.  Continue home medications   Hypertension: Fair control. Will continue her home ACE inhibitor. Monitor   Code Status: full Family Communication: none present Disposition Plan: back to group home when ready   Consultants:  Dr Gerilyn Pilgrim neurolgy  Procedures:  EEG  Antibiotics:  none  HPI/Subjective: Sitting up in bed. Hollering but easily calmed. Reports feeling scared but denies pain.  Objective: Filed Vitals:   05/04/14 0643  BP: 119/92  Pulse: 63  Temp: 98 F (36.7 C)  Resp: 20    Intake/Output Summary (Last 24 hours) at 05/04/14 1048 Last data filed at 05/04/14 0941  Gross per 24 hour  Intake   1080 ml  Output   2175 ml  Net  -1095 ml   There were no vitals filed for this visit.  Exam:   General:  Obese somewhat anxious NAD  Cardiovascular: RRR No MGR trace LE edema  Respiratory: normal effort BS clear bilaterally no wheeze  Abdomen: obese soft +BS non-tender to palpation  Musculoskeletal: no clubbing or cyanosis  Neuro: oriented to self. Follows commands. Able to make needs known   Data Reviewed: Basic Metabolic Panel:  Recent Labs Lab  05/03/14 0923 05/03/14 0932  NA  --  144  K  --  3.9  CL  --  105  CO2  --  26  GLUCOSE  --  89  BUN  --  10  CREATININE  --  0.55  CALCIUM  --  9.3  MG 1.9  --    Liver Function Tests:  Recent Labs Lab 05/03/14 0932  AST 13  ALT 9  ALKPHOS 54  BILITOT 0.2*  PROT 6.8  ALBUMIN 3.5   No results found for this basename: LIPASE, AMYLASE,  in the last 168 hours  Recent Labs Lab 05/03/14 1613  AMMONIA 27   CBC:  Recent Labs Lab 05/03/14 0932  WBC 5.8  NEUTROABS 3.7  HGB 12.4  HCT 36.2  MCV 87.2  PLT 286   Cardiac Enzymes: No results found for this basename: CKTOTAL, CKMB, CKMBINDEX, TROPONINI,  in the last 168 hours BNP (last 3 results) No results found for this basename: PROBNP,  in the last 8760 hours CBG: No results found for this basename: GLUCAP,  in the last 168 hours  No results found for this or any previous visit (from the past 240 hour(s)).   Studies: Ct Head Wo Contrast  05/03/2014   CLINICAL DATA:  Seizure.  Altered mental status.  EXAM: CT HEAD WITHOUT CONTRAST  TECHNIQUE: Contiguous axial images were obtained from the base of the skull through the vertex without intravenous contrast.  COMPARISON:  Head CT scan 05/02/2014.  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 No evidence of acute  intracranial abnormality including infarct, hemorrhage, mass lesion, mass effect, midline shift or abnormal extra-axial fluid collection is identified. There is no hydrocephalus or pneumocephalus. Imaged paranasal sinuses and mastoid air cells are clear.  IMPRESSION: No acute abnormality.  Cerebellar atrophy.   Electronically Signed   By: Drusilla Kannerhomas  Dalessio M.D.   On: 05/03/2014 10:17   Dg Chest Portable 1 View  05/03/2014   CLINICAL DATA:  Seizures.  EXAM: PORTABLE CHEST - 1 VIEW  COMPARISON:  None.  FINDINGS: The lungs are clear. Heart size is upper normal. No pneumothorax or pleural effusion. No focal bony abnormality.  IMPRESSION: No acute  disease.   Electronically Signed   By: Drusilla Kannerhomas  Dalessio M.D.   On: 05/03/2014 09:53    Scheduled Meds: . enoxaparin (LOVENOX) injection  40 mg Subcutaneous Q24H  . levETIRAcetam  500 mg Oral BID  . oxybutynin  5 mg Oral Daily  . phenobarbital  129.6 mg Oral Q48H  . PHENobarbital  194.4 mg Oral Q48H  . QUEtiapine  50 mg Oral BID  . senna  1 tablet Oral BID  . simvastatin  20 mg Oral Daily  . sodium chloride  3 mL Intravenous Q12H  . trandolapril  1 mg Oral Daily   Continuous Infusions:   Principal Problem:   Seizure Active Problems:   Mental retardation   Hypertension    Time spent: 35 minutes    [facility] TennesseeBLACK,Chianne Byrns M  [facility] Pager [phone]. If 7PM-7AM, please contact night-coverage at [email or web address], password [email or web address] 05/04/2014, 10:48 AM  LOS: 1 day

## 2014-05-04 NOTE — Clinical Social Work Note (Signed)
Clinical Social Work Department BRIEF PSYCHOSOCIAL ASSESSMENT 05/04/2014  Patient:  Christine Pena,Christine Pena     Account Number:  [id number]     Admit date:  05/03/2014  Clinical Social Worker:  Santa GeneraUNNINGHAM,Ellice Boultinghouse, CLINICAL SOCIAL WORKER  Date/Time:  05/04/2014 02:00 PM  Referred by:  Physician  Date Referred:  05/04/2014 Referred for  ALF Placement   Other Referral:   Interview type:  Other - See comment Other interview type:   Family unavailable, information obtained from Christine Pena, Group Home manager    PSYCHOSOCIAL DATA Living Status:  FACILITY Admitted from facility:  Other Level of care:  Group Home Primary support name:  Christine Pena Primary support relationship to patient:  PARENT Degree of support available:   Christine Pena, Christine Pena, is patient's legal guardian.  Christine Pena is currently at [facility], patient was placed at [facility] 10/18/13.  Has brothers Christine Pena and Christine Pena who visit regularly.    CURRENT CONCERNS Current Concerns  Post-Acute Placement   Other Concerns:    SOCIAL WORK ASSESSMENT / PLAN CSW unable to assess patient directly, patient oriented to self only.  CSW contacted Christine Pena, Production designer, theatre/television/filmadministrator of [facility] where patient currently lives. Patient lived w Christine Pena until 10-18-13 when she was admitted to [facility].  Christine Pena is elderly and had a fall, was [facility] and [facility]. Brothers Christine Pena and Christine Pena are both involved and supportive, bring Christine Pena from SNF to [facility] for weekly visit.  Christine Pena is patient's legal guardian at present, but brothers have been decision makers for patient's care at [facility] since Christine Pena/guardian has been unable to be physically present for patient.    At group home, patient has been able to ambulate and transfer without assistance.  She does have an "unusual gait" and staff walk alongside her when she walks from group home to sheltered work place on property daily. Patient eats family style, is able  to feed herself.  Per [facility] staff, patient has made great progress while in placement and she has been taught to dress herself, toilet herself and feed herself.  Staff do assist w bathing. Patient participates in "senior exercise program" of mild chair exercises and goes on outings w group home.  Staff have noted that her right leg "shakes" but "that doesn't mean that she is going to have a seizure."    [place] [phone]).  His PA is Berton BonMorrow Dowdle, either provider is able to provide information about patient if needed.    [facility] is willing to take patient back at discharge and expressed no concerns.  [facility] was encouraged to come assess patient to determine if her level of assistance had changed from baseline.  However, facility believes that they are able to manage patient at present.  Will provide transportation for return to facility.  Contact Christine Pena [phone]) with regular updates.   Assessment/plan status:  Psychosocial Support/Ongoing Assessment of Needs Other assessment/ plan:   Information/referral to community resources:   [facility]    PATIENT'S/FAMILY'S RESPONSE TO PLAN OF CARE: Unable to assess patient or family, will continue totry to contact brother.       Santa GeneraAnne Newel Oien, LCSW Clinical Social Worker [phone])

## 2014-05-04 NOTE — Clinical Social Work Note (Signed)
CSW spoke w brother, Christine Pena. Confirmed that family would like patient to return to [facility] at discharge, expressed no concerns about living situation.  Says that mother is aware that patient is in the hospital.  Says to his knowledge, patient's last seizure was approx 5 years ago around the time her father died.  Would like to be updated when patient is ready for discharge.  Santa GeneraAnne Kimetha Trulson, LCSW Clinical Social Worker [phone])

## 2014-05-04 NOTE — Care Management Utilization Note (Signed)
UR completed 

## 2014-05-04 NOTE — Progress Notes (Signed)
EEG could not be performed due to patient. Patient would take electrodes off after tech placed them, nurse tech in room during entire procedure and was told that EEG will be tried again tomorrow afternoon 05/05/14

## 2014-05-04 NOTE — Progress Notes (Signed)
Patient seen and examined. Note reviewed.   patient was admitted with breakthrough seizures after Depakote was discontinued. She is continued on phenobarbital at her home dose. Riser added to her antibiotic regimen. Appreciate nephrology assistance. EEG was attempted today, but patient did not allow this. She was repeatedly removing leads from her forehead. This may be challenging due to her underlying mental retardation and anxiety. She's not any further seizures while in the hospital. Anticipate that she'll be ready for discharge in the next 24-48 hours.  Christine Pena

## 2014-05-04 NOTE — Progress Notes (Signed)
Patient ID: TASIA LIZ, female   DOB: [birth year], 58 y.o.   MRN: [id number]  Lorain A. Merlene Laughter, MD     [email or web address]          CALYSSA ZOBRIST is an 58 y.o. female.   Assessment/Plan: 1. Resolved status epilepticus after the discontinuation of Depakote. The patient has been started on Keppra. Given her baseline cognitive impairment/mental retardation and Behavioral problem, she has a higher risk of developing behavioral problems with the Keppra. Given the behavioral problems, we will start the patient on Lamictal. We will continue with the phenobarbital and Keppra for now although long-term I think we will gradually wean her off the [place] when she is on a therapeutic dose of the Lamictal.  2. Marked mental retardation at baseline. Continue with her baseline psychotropic medications.  3. Depakote-induced elevation of ammonia level. The Depakote has been discontinued.   The patient has had some behavioral issues today. She pulled out her IV and also was cursing and the nurses. EEG was attempted but she was not cooperative with this.  GENERAL: Obese female who seen up in the chair.  HEENT: Supple. Atraumatic normocephalic.  ABDOMEN: soft  EXTREMITIES: Significant nonpitting edema of the legs.  BACK: Normal.  SKIN: Normal by inspection.  MENTAL STATUS: She is awake and alert. She has severe dysarthria. She speaks only 2 word sentences and stated that she is doing well. She does follow commands. Otherwise she cannot answer any questions.  CRANIAL NERVES: Pupils are equal, round and reactive to light and accommodation; extra ocular movements are full, there is no significant nystagmus; visual fields are full; upper and lower facial muscles are normal in strength and symmetric, there is no flattening of the nasolabial folds; tongue is midline; uvula is midline; shoulder elevation is normal. Visual fields somewhat limited on examination but appears to be full.    MOTOR: Exact strength is not possible because of cognitive issues but she has at least 4+ in the upper extremities and antigravity in the legs.  COORDINATION: No tremors are noted. There is no dysmetria.         Objective: Vital signs in last 24 hours: Temp:  [98 F (36.7 C)-98.2 F (36.8 C)] 98.2 F (36.8 C) (08/12 1528) Pulse Rate:  [63-73] 73 (08/12 1528) Resp:  [20] 20 (08/12 1528) BP: (119-144)/(84-92) 144/84 mmHg (08/12 1528) SpO2:  [97 %-100 %] 100 % (08/12 1528)  Intake/Output from previous day: 08/11 0701 - 08/12 0700 In: 480 [P.O.:480] Out: 1000 [Urine:1000] Intake/Output this shift:   Nutritional status: Cardiac   Lab Results: Results for orders placed during the hospital encounter of 05/03/14 (from the past 48 hour(s))  MAGNESIUM     Status: None   Collection Time    05/03/14  9:23 AM      Result Value Ref Range   Magnesium 1.9  1.5 - 2.5 mg/dL  TSH     Status: None   Collection Time    05/03/14  9:23 AM      Result Value Ref Range   TSH 2.800  0.350 - 4.500 uIU/mL   Comment: Performed at [facility]     Status: None   Collection Time    05/03/14  9:32 AM      Result Value Ref Range   WBC 5.8  4.0 - 10.5 K/uL   RBC 4.15  3.87 - 5.11 MIL/uL   Hemoglobin 12.4  12.0 - 15.0 g/dL  HCT 36.2  36.0 - 46.0 %   MCV 87.2  78.0 - 100.0 fL   MCH 29.9  26.0 - 34.0 pg   MCHC 34.3  30.0 - 36.0 g/dL   RDW 12.3  11.5 - 15.5 %   Platelets 286  150 - 400 K/uL   Neutrophils Relative % 64  43 - 77 %   Neutro Abs 3.7  1.7 - 7.7 K/uL   Lymphocytes Relative 30  12 - 46 %   Lymphs Abs 1.8  0.7 - 4.0 K/uL   Monocytes Relative 6  3 - 12 %   Monocytes Absolute 0.3  0.1 - 1.0 K/uL   Eosinophils Relative 0  0 - 5 %   Eosinophils Absolute 0.0  0.0 - 0.7 K/uL   Basophils Relative 0  0 - 1 %   Basophils Absolute 0.0  0.0 - 0.1 K/uL  COMPREHENSIVE METABOLIC PANEL     Status: Abnormal   Collection Time    05/03/14  9:32 AM      Result  Value Ref Range   Sodium 144  137 - 147 mEq/L   Potassium 3.9  3.7 - 5.3 mEq/L   Chloride 105  96 - 112 mEq/L   CO2 26  19 - 32 mEq/L   Glucose, Bld 89  70 - 99 mg/dL   BUN 10  6 - 23 mg/dL   Creatinine, Ser 0.55  0.50 - 1.10 mg/dL   Calcium 9.3  8.4 - 10.5 mg/dL   Total Protein 6.8  6.0 - 8.3 g/dL   Albumin 3.5  3.5 - 5.2 g/dL   AST 13  0 - 37 U/L   ALT 9  0 - 35 U/L   Alkaline Phosphatase 54  39 - 117 U/L   Total Bilirubin 0.2 (*) 0.3 - 1.2 mg/dL   GFR calc non Af Amer >90  >90 mL/min   GFR calc Af Amer >90  >90 mL/min   Comment: (NOTE)     The eGFR has been calculated using the CKD EPI equation.     This calculation has not been validated in all clinical situations.     eGFR's persistently <90 mL/min signify possible Chronic Kidney     Disease.   Anion gap 13  5 - 15  PHENOBARBITAL LEVEL     Status: None   Collection Time    05/03/14  9:32 AM      Result Value Ref Range   Phenobarbital 27.2  15.0 - 40.0 ug/mL  VALPROIC ACID LEVEL     Status: Abnormal   Collection Time    05/03/14  9:32 AM      Result Value Ref Range   Valproic Acid Lvl <10.0 (*) 50.0 - 100.0 ug/mL  URINALYSIS, ROUTINE W REFLEX MICROSCOPIC     Status: Abnormal   Collection Time    05/03/14  9:46 AM      Result Value Ref Range   Color, Urine YELLOW  YELLOW   APPearance CLEAR  CLEAR   Specific Gravity, Urine 1.025  1.005 - 1.030   pH 6.0  5.0 - 8.0   Glucose, UA NEGATIVE  NEGATIVE mg/dL   Hgb urine dipstick MODERATE (*) NEGATIVE   Bilirubin Urine NEGATIVE  NEGATIVE   Ketones, ur NEGATIVE  NEGATIVE mg/dL   Protein, ur 100 (*) NEGATIVE mg/dL   Urobilinogen, UA 0.2  0.0 - 1.0 mg/dL   Nitrite NEGATIVE  NEGATIVE   Leukocytes, UA NEGATIVE  NEGATIVE  URINE  MICROSCOPIC-ADD ON     Status: Abnormal   Collection Time    05/03/14  9:46 AM      Result Value Ref Range   Squamous Epithelial / LPF FEW (*) RARE   RBC / HPF 7-10  <3 RBC/hpf   Bacteria, UA FEW (*) RARE  AMMONIA     Status: None   Collection Time     05/03/14  4:13 PM      Result Value Ref Range   Ammonia 27  11 - 60 umol/L    Lipid Panel No results found for this basename: CHOL, TRIG, HDL, CHOLHDL, VLDL, LDLCALC,  in the last 72 hours  Studies/Results: Ct Head Wo Contrast  05/03/2014   CLINICAL DATA:  Seizure.  Altered mental status.  EXAM: CT HEAD WITHOUT CONTRAST  TECHNIQUE: Contiguous axial images were obtained from the base of the skull through the vertex without intravenous contrast.  COMPARISON:  Head CT scan 05/02/2014.  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 No evidence of acute intracranial abnormality including infarct, hemorrhage, mass lesion, mass effect, midline shift or abnormal extra-axial fluid collection is identified. There is no hydrocephalus or pneumocephalus. Imaged paranasal sinuses and mastoid air cells are clear.  IMPRESSION: No acute abnormality.  Cerebellar atrophy.   Electronically Signed   By: Inge Rise M.D.   On: 05/03/2014 10:17   Dg Chest Portable 1 View  05/03/2014   CLINICAL DATA:  Seizures.  EXAM: PORTABLE CHEST - 1 VIEW  COMPARISON:  None.  FINDINGS: The lungs are clear. Heart size is upper normal. No pneumothorax or pleural effusion. No focal bony abnormality.  IMPRESSION: No acute disease.   Electronically Signed   By: Inge Rise M.D.   On: 05/03/2014 09:53    Medications:  Scheduled Meds: . enoxaparin (LOVENOX) injection  40 mg Subcutaneous Q24H  . levETIRAcetam  500 mg Oral BID  . oxybutynin  5 mg Oral Daily  . phenobarbital  129.6 mg Oral Q48H  . PHENobarbital  194.4 mg Oral Q48H  . QUEtiapine  50 mg Oral BID  . senna  1 tablet Oral BID  . simvastatin  20 mg Oral Daily  . sodium chloride  3 mL Intravenous Q12H  . trandolapril  1 mg Oral Daily   Continuous Infusions:  PRN Meds:.acetaminophen, acetaminophen, alum & mag hydroxide-simeth, bisacodyl, LORazepam, ondansetron (ZOFRAN) IV, ondansetron     LOS: 1 day   Kiko Ripp A. Merlene Laughter, M.D.  Diplomate,  Tax adviser of Psychiatry and Neurology ( Neurology).

## 2014-05-04 NOTE — Progress Notes (Signed)
Dr. Gerilyn Pilgrim on floor to see patient.  York Spaniel it was okay to leave IV out.

## 2014-05-05 ENCOUNTER — Other Ambulatory Visit (HOSPITAL_COMMUNITY): Payer: Medicaid Other

## 2014-05-05 DIAGNOSIS — F79 Unspecified intellectual disabilities: Secondary | ICD-10-CM

## 2014-05-05 DIAGNOSIS — I10 Essential (primary) hypertension: Secondary | ICD-10-CM

## 2014-05-05 DIAGNOSIS — R569 Unspecified convulsions: Secondary | ICD-10-CM

## 2014-05-05 MED ORDER — LAMOTRIGINE 25 MG PO TABS: 25.0000 mg | ORAL_TABLET | Freq: Two times a day (BID) | ORAL | Status: DC

## 2014-05-05 MED ORDER — LEVETIRACETAM 500 MG PO TABS: 500.0000 mg | ORAL_TABLET | Freq: Two times a day (BID) | ORAL | Status: DC

## 2014-05-05 NOTE — Clinical Documentation Improvement (Signed)
Possible Clinical conditions  Morbid Obesity W/ BMI=44   Other condition___________________  Cannot clinically determine _____________     Remi Haggardhank You, Daleysa Kristiansen C Tonimarie Gritz ,RN Clinical Documentation Specialist:  [phone]  [facility]- Health Information Management

## 2014-05-05 NOTE — Clinical Social Work Note (Signed)
Pt d/c today back to [facility] group home. Spoke with [name], pt's brother and he requests [facility] to come pick pt up. Notified Emma at facility and she will send someone to get pt.   Derenda FennelKara Marsia Cino, [place] [phone]

## 2014-05-05 NOTE — Discharge Summary (Signed)
Physician Discharge Summary  Christine EagleDebra L Raulerson NWG:[id number] DOB: [birth year] DOA: 05/03/2014  PCP: [name], [name], MD  Admit date: 05/03/2014 Discharge date: 05/05/2014  Time spent: 40 minutes  Recommendations for Outpatient Follow-up:  1. Follow up with Dr. Gerilyn Pilgrimoonquah in 1 month for evaluation of seizure medication 2. Back to group home.   Discharge Diagnoses:  Principal Problem:   Seizure disorder Active Problems:   Mental retardation   Hypertension   Morbid obesity   Discharge Condition: stable  Diet recommendation: heart healthy  There were no vitals filed for this visit.  History of present illness:  Christine Pena is a 58 y.o. female with a past medical history that includes mental retardation, hypertension, seizure living in a group home, presented to the emergency department on 05/03/14 with the chief complaint of seizure. Information  obtained from the caregiver who is at the group home. Reportedly patient [place] [facility] [place] via a transportation service when she had 3 seizures that were witnessed by the driver. Driver describef patient as becoming lethargic with purple lips and drooling. Each episode lasted only 30 seconds to a minute. Caregiver reported patient had a seizure yesterday and [facility] and then discharged home. According to the caregiver there was a medication adjustment recently due to a elevated ammonia level. Family reported patient had not had a seizure since 2013. There was no report of recent illness no fever chills nausea vomiting diarrhea. No complaints of headache visual disturbances gait disturbances recent falls. Currently she'd been eating and drinking her normal amount has been no unintentional weight loss.  Initial workup in the emergency department included complete metabolic panel was unremarkable, complete blood count was unremarkable, CT of the head without acute abnormality chest x-ray  with no active disease. Her phenobarbital level is 27.2. In the emergency department her vital signs were stable she was afebrile and not hypoxic appearing.  Hospital Course:  Principal Problem:  Seizure: related to discontinuation of depakote. Started on keppra and home Phenobarbital  continued. Evaluated by Dr Gerilyn Pilgrimoonquah who opined Lamictal may be better medication as less risk for behavioral problems than keppra. No further seizure activity. Unable to obtain EEG. Per Dr Gerilyn Pilgrimoonquah discharge on Keppra, lamictal and phenobarbital. Follow up with him 1 month at which time Keppra taper will be considered.   Active Problems:  Mental retardation: Some intermittent agitation. Easily redirected.   Hypertension: Fair control.    Procedures:  none  Consultations:  Dr Gerilyn Pilgrimoonquah neurology  Discharge Exam: Filed Vitals:   05/05/14 0518  BP: 132/65  Pulse: 65  Temp: 98.1 F (36.7 C)  Resp: 20    General: well nourished NAD Cardiovascular: RRR No MGR No LE edema Respiratory: normal effort BS clear Neuro: alert oriented to self follows commands  Discharge Instructions You were cared for by a hospitalist during your hospital stay. If you have any questions about your discharge medications or the care you received while you were in the hospital after you are discharged, you can call the unit and asked to speak with the hospitalist on call if the hospitalist that took care of you is not available. Once you are discharged, your primary care physician will handle any further medical issues. Please note that NO REFILLS for any discharge medications will be authorized once you are discharged, as it is imperative that you return to your primary care physician (or establish a relationship with a primary care physician if  you do not have one) for your aftercare needs so that they can reassess your need for medications and monitor your lab values.     Medication List         lamoTRIgine 25 MG tablet   Commonly known as:  LAMICTAL  Take 1 tablet (25 mg total) by mouth 2 (two) times daily.     levETIRAcetam 500 MG tablet  Commonly known as:  KEPPRA  Take 1 tablet (500 mg total) by mouth 2 (two) times daily.     moexipril 7.5 MG tablet  Commonly known as:  UNIVASC  Take 7.5 mg by mouth daily.     oxybutynin 5 MG tablet  Commonly known as:  DITROPAN  Take 5 mg by mouth daily.     PHENobarbital 64.8 MG tablet  Commonly known as:  LUMINAL  Take 129.6-194.4 mg by mouth See admin instructions. Takes 2 tablets at bedtime, alternating with 3 tablets the next night; repeat.     QUEtiapine 50 MG tablet  Commonly known as:  SEROQUEL  Take 50 mg by mouth 2 (two) times daily.     simvastatin 20 MG tablet  Commonly known as:  ZOCOR  Take 20 mg by mouth daily.     Vitamin D (Ergocalciferol) 50000 UNITS Caps capsule  Commonly known as:  DRISDOL  Take 50,000 Units by mouth every 7 (seven) days. Takes on Saturdays.       Allergies  Allergen Reactions  . Haldol [Haloperidol Lactate]     Unknown reaction.       Follow-up Information   Follow up with [facility], KOFI, MD In 1 month. (call to make appointment. follow up on seizure medications)    Specialty:  Neurology   Contact information:   [address] [place] [address] [phone]        The results of significant diagnostics from this hospitalization (including imaging, microbiology, ancillary and laboratory) are listed below for reference.    Significant Diagnostic Studies: Ct Head Wo Contrast  05/03/2014   CLINICAL DATA:  Seizure.  Altered mental status.  EXAM: CT HEAD WITHOUT CONTRAST  TECHNIQUE: Contiguous axial images were obtained from the base of the skull through the vertex without intravenous contrast.  COMPARISON:  Head CT scan 05/02/2014.  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 No evidence of acute intracranial abnormality including infarct, hemorrhage, mass lesion, mass effect,  midline shift or abnormal extra-axial fluid collection is identified. There is no hydrocephalus or pneumocephalus. Imaged paranasal sinuses and mastoid air cells are clear.  IMPRESSION: No acute abnormality.  Cerebellar atrophy.   Electronically Signed   By: Drusilla Kanner M.D.   On: 05/03/2014 10:17   Dg Chest Portable 1 View  05/03/2014   CLINICAL DATA:  Seizures.  EXAM: PORTABLE CHEST - 1 VIEW  COMPARISON:  None.  FINDINGS: The lungs are clear. Heart size is upper normal. No pneumothorax or pleural effusion. No focal bony abnormality.  IMPRESSION: No acute disease.   Electronically Signed   By: Drusilla Kanner M.D.   On: 05/03/2014 09:53    Microbiology: No results found for this or any previous visit (from the past 240 hour(s)).   Labs: Basic Metabolic Panel:  Recent Labs Lab 05/03/14 0923 05/03/14 0932  NA  --  144  K  --  3.9  CL  --  105  CO2  --  26  GLUCOSE  --  89  BUN  --  10  CREATININE  --  0.55  CALCIUM  --  9.3  MG 1.9  --    Liver Function Tests:  Recent Labs Lab 05/03/14 0932  AST 13  ALT 9  ALKPHOS 54  BILITOT 0.2*  PROT 6.8  ALBUMIN 3.5   No results found for this basename: LIPASE, AMYLASE,  in the last 168 hours  Recent Labs Lab 05/03/14 1613  AMMONIA 27   CBC:  Recent Labs Lab 05/03/14 0932  WBC 5.8  NEUTROABS 3.7  HGB 12.4  HCT 36.2  MCV 87.2  PLT 286   Cardiac Enzymes: No results found for this basename: CKTOTAL, CKMB, CKMBINDEX, TROPONINI,  in the last 168 hours BNP: BNP (last 3 results) No results found for this basename: PROBNP,  in the last 8760 hours CBG: No results found for this basename: GLUCAP,  in the last 168 hours     Signed:  Gwenyth Bender  [facility] 05/05/2014, 9:19 AM  Attending note:  Patient seen and examined. Above note reviewed and agree.  No further seizures since being in the hospital.  Anti epileptics have been adjusted by neurology and she will plan on following up with them for  further adjustments. She is otherwise stable for discharge.  Yamileth Hayse

## 2014-05-05 NOTE — Progress Notes (Signed)
Patient's brother was concerned about patient's xanax.  Brother reports that patient is xanax at group home, has not been receiving while in the hospital.

## 2014-05-05 NOTE — Progress Notes (Signed)
Ur review complete.  

## 2014-07-20 ENCOUNTER — Telehealth (HOSPITAL_COMMUNITY): Payer: Self-pay

## 2014-07-20 NOTE — Telephone Encounter (Signed)
Got referral for evaluation and tried 10/8, 10/19 and 10/23 to contact patient.  No answer and was unable to leave a message

## 2014-09-30 DIAGNOSIS — H93293 Other abnormal auditory perceptions, bilateral: Secondary | ICD-10-CM | POA: Diagnosis not present

## 2014-09-30 DIAGNOSIS — F79 Unspecified intellectual disabilities: Secondary | ICD-10-CM | POA: Diagnosis not present

## 2014-09-30 DIAGNOSIS — H6122 Impacted cerumen, left ear: Secondary | ICD-10-CM | POA: Diagnosis not present

## 2014-10-05 ENCOUNTER — Emergency Department (HOSPITAL_COMMUNITY)
Admission: EM | Admit: 2014-10-05 | Discharge: 2014-10-05 | Disposition: A | Discharge status: Home / Self Care | Payer: Medicare Other | Attending: Emergency Medicine | Admitting: Emergency Medicine

## 2014-10-05 ENCOUNTER — Encounter (HOSPITAL_COMMUNITY): Payer: Self-pay | Admitting: Emergency Medicine

## 2014-10-05 ENCOUNTER — Emergency Department (HOSPITAL_COMMUNITY): Payer: Medicare Other

## 2014-10-05 DIAGNOSIS — R531 Weakness: Secondary | ICD-10-CM | POA: Diagnosis not present

## 2014-10-05 DIAGNOSIS — R40241 Glasgow coma scale score 13-15: Secondary | ICD-10-CM | POA: Diagnosis not present

## 2014-10-05 DIAGNOSIS — I10 Essential (primary) hypertension: Secondary | ICD-10-CM | POA: Diagnosis not present

## 2014-10-05 DIAGNOSIS — Z7952 Long term (current) use of systemic steroids: Secondary | ICD-10-CM | POA: Insufficient documentation

## 2014-10-05 DIAGNOSIS — Z79899 Other long term (current) drug therapy: Secondary | ICD-10-CM | POA: Insufficient documentation

## 2014-10-05 DIAGNOSIS — B9689 Other specified bacterial agents as the cause of diseases classified elsewhere: Secondary | ICD-10-CM | POA: Diagnosis not present

## 2014-10-05 DIAGNOSIS — G40909 Epilepsy, unspecified, not intractable, without status epilepticus: Secondary | ICD-10-CM | POA: Insufficient documentation

## 2014-10-05 DIAGNOSIS — N390 Urinary tract infection, site not specified: Secondary | ICD-10-CM | POA: Insufficient documentation

## 2014-10-05 DIAGNOSIS — F79 Unspecified intellectual disabilities: Secondary | ICD-10-CM | POA: Diagnosis not present

## 2014-10-05 DIAGNOSIS — M79601 Pain in right arm: Secondary | ICD-10-CM | POA: Diagnosis not present

## 2014-10-05 DIAGNOSIS — M6281 Muscle weakness (generalized): Secondary | ICD-10-CM | POA: Diagnosis not present

## 2014-10-05 LAB — CBC WITH DIFFERENTIAL/PLATELET
BASOS PCT: 0 % (ref 0–1)
Basophils Absolute: 0 10*3/uL (ref 0.0–0.1)
EOS PCT: 0 % (ref 0–5)
Eosinophils Absolute: 0 10*3/uL (ref 0.0–0.7)
HEMATOCRIT: 37.1 % (ref 36.0–46.0)
HEMOGLOBIN: 12.5 g/dL (ref 12.0–15.0)
Lymphocytes Relative: 31 % (ref 12–46)
Lymphs Abs: 2.2 10*3/uL (ref 0.7–4.0)
MCH: 29.3 pg (ref 26.0–34.0)
MCHC: 33.7 g/dL (ref 30.0–36.0)
MCV: 86.9 fL (ref 78.0–100.0)
MONOS PCT: 6 % (ref 3–12)
Monocytes Absolute: 0.4 10*3/uL (ref 0.1–1.0)
Neutro Abs: 4.4 10*3/uL (ref 1.7–7.7)
Neutrophils Relative %: 63 % (ref 43–77)
Platelets: 326 10*3/uL (ref 150–400)
RBC: 4.27 MIL/uL (ref 3.87–5.11)
RDW: 12.5 % (ref 11.5–15.5)
WBC: 7.1 10*3/uL (ref 4.0–10.5)

## 2014-10-05 LAB — URINALYSIS, ROUTINE W REFLEX MICROSCOPIC
Bilirubin Urine: NEGATIVE
GLUCOSE, UA: NEGATIVE mg/dL
Ketones, ur: NEGATIVE mg/dL
Nitrite: NEGATIVE
Specific Gravity, Urine: 1.01 (ref 1.005–1.030)
UROBILINOGEN UA: 0.2 mg/dL (ref 0.0–1.0)
pH: 6 (ref 5.0–8.0)

## 2014-10-05 LAB — BASIC METABOLIC PANEL
Anion gap: 6 (ref 5–15)
BUN: 10 mg/dL (ref 6–23)
CALCIUM: 9.2 mg/dL (ref 8.4–10.5)
CHLORIDE: 103 meq/L (ref 96–112)
CO2: 26 mmol/L (ref 19–32)
Creatinine, Ser: 0.96 mg/dL (ref 0.50–1.10)
GFR calc Af Amer: 74 mL/min — ABNORMAL LOW (ref >90)
GFR calc non Af Amer: 63 mL/min — ABNORMAL LOW (ref >90)
GLUCOSE: 101 mg/dL — AB (ref 70–99)
Potassium: 3.6 mmol/L (ref 3.5–5.1)
SODIUM: 135 mmol/L (ref 135–145)

## 2014-10-05 LAB — PHENOBARBITAL LEVEL: Phenobarbital: 22.6 ug/mL (ref 15.0–40.0)

## 2014-10-05 LAB — URINE MICROSCOPIC-ADD ON: Urine-Other: NONE SEEN

## 2014-10-05 MED ORDER — CEPHALEXIN 500 MG PO CAPS
500.0000 mg | ORAL_CAPSULE | Freq: Once | ORAL | Status: AC
Start: 2014-10-05 — End: 2014-10-05
  Administered 2014-10-05: 500 mg via ORAL
  Filled 2014-10-05: qty 1

## 2014-10-05 MED ORDER — CEPHALEXIN 500 MG PO CAPS: 500.0000 mg | ORAL_CAPSULE | Freq: Two times a day (BID) | ORAL | Status: DC

## 2014-10-05 NOTE — ED Notes (Signed)
EMS brings ing with complaints of right lower arm and right leg.  Stroke screen Neg, Pt has  Speech  Changes and facial droop  from pulled of tooth recently, pt is from group home.

## 2014-10-05 NOTE — ED Provider Notes (Signed)
This chart was scribed for Christine Maw Ward, DO by Tonye Royalty, ED Scribe. This patient was seen in room APA11/APA11   TIME SEEN: 1:15 PM  Level 5 Caveat-mental retardation  CHIEF COMPLAINT: right-sided abnormalities  HPI: Christine Pena is a 59 y.o. female with history of mental retardation and seizures on Keppra, Lamictal, Phenobarbital and Invega who lives at a group home who presents to the Emergency Department complaining of right-sided abnormalities. Per family member, she had 4 small seizures on New Year's Eve (her last prior to this was August), and since has "not been herself," worsening greatly 2 days ago. She states the patient has had speech differences, abnormal gait staff feels that she is not moving her right leg normally, holding her right elbow bent against her body, wiping as if she is drooling but without actually drooling, and right bottom eyelid that the staff feels is slightly drooping compared to normal. She states they can normally understand her speech but are having difficulty now and notes some slurring. Relative states she normally has an odd gait and somebody stands near her when she walks, but states she normally can ambulate on her own; she notes that the patient recently recently needs more help. They state that her primary care physician instructed them to come to the ER for further evaluation. Group home staff member states patient has not had recent falls, head injuries, or medication changes. She states she does not use any blood thinners. They have not witnessed any seizure activity in the past several days. She denies history of HLD, HTN, atrial fibrillation, DM, or CVA. She denies fever, cough, vomiting, or diarrhea. States she's been eating and drinking well.   ROS: Level 5 Caveat-mental retardation  PAST MEDICAL HISTORY/PAST SURGICAL HISTORY:  Past Medical History  Diagnosis Date  . Hypertension   . Seizures   . Mental retardation     MEDICATIONS:  Prior  to Admission medications   Medication Sig Start Date End Date Taking? Authorizing Provider  chlorhexidine (PERIDEX) 0.12 % solution Use as directed 10 mLs in the mouth or throat 4 (four) times daily.   Yes Historical Provider, MD  hydrocortisone (ANUSOL-HC) 2.5 % rectal cream Place 1 application rectally 2 (two) times daily.   Yes Historical Provider, MD  lamoTRIgine (LAMICTAL) 25 MG tablet Take 1 tablet (25 mg total) by mouth 2 (two) times daily. 05/05/14  Yes Lesle Chris Black, NP  levETIRAcetam (KEPPRA) 500 MG tablet Take 1 tablet (500 mg total) by mouth 2 (two) times daily. 05/05/14  Yes Lesle Chris Black, NP  moexipril (UNIVASC) 7.5 MG tablet Take 7.5 mg by mouth daily.  12/30/13  Yes Historical Provider, MD  oxybutynin (DITROPAN) 5 MG tablet Take 5 mg by mouth daily.  12/30/13  Yes Historical Provider, MD  paliperidone (INVEGA) 3 MG 24 hr tablet Take 3-6 mg by mouth daily. Take  daily x5 days; then take  thereafter.   Yes Historical Provider, MD  PHENobarbital (LUMINAL) 64.8 MG tablet Take 129.6-194.4 mg by mouth See admin instructions. Takes 2 tablets at bedtime, alternating with 3 tablets the next night; repeat. 12/30/13  Yes Historical Provider, MD  simvastatin (ZOCOR) 20 MG tablet Take 20 mg by mouth daily.  12/30/13  Yes Historical Provider, MD  Vitamin D, Ergocalciferol, (DRISDOL) 50000 UNITS CAPS capsule Take 50,000 Units by mouth every 7 (seven) days. Takes on Saturdays.   Yes Historical Provider, MD  vitamin E 200 UNIT capsule Take 200 Units by mouth daily.  Yes Historical Provider, MD    ALLERGIES:  Allergies  Allergen Reactions  . Haldol [Haloperidol Lactate]     Unknown reaction.    SOCIAL HISTORY:  History  Substance Use Topics  . Smoking status: Never Smoker   . Smokeless tobacco: Never Used  . Alcohol Use: No    FAMILY HISTORY: Family History  Problem Relation Age of Onset  . Heart disease Mother     EXAM: BP 137/78 mmHg  Pulse 72  Temp(Src) 98 F (36.7 C) (Oral)   Resp 18  SpO2 97% CONSTITUTIONAL: alert but unable to answer questions or follow commands approparitely secondary to mental retardation; Well appearing, in no distress HEAD: Normocephalic EYES: Conjunctivae clear, PERRL ENT: normal nose; no rhinorrhea; moist mucous membranes; pharynx without lesions noted NECK: Supple, no meningismus, no LAD  CARD: RRR; S1 and S2 appreciated; no murmurs, no clicks, no rubs, no gallops RESP: Normal chest excursion without splinting or tachypnea; breath sounds clear and equal bilaterally; no wheezes, no rhonchi, no rales,  ABD/GI: Normal bowel sounds; non-distended; soft, non-tender, no rebound, no guarding BACK:  The back appears normal and is non-tender to palpation, there is no CVA tenderness EXT: Normal ROM in all joints; non-tender to palpation; no edema; normal capillary refill; no cyanosis    SKIN: Normal color for age and race; warm NEURO: Moves all extremities equally but cannot follow commands or answer questions, unable to test sensation, no obvious facial droop, very mild dysarthria, ambulates with a staggering gait, requires assistance; complaining that her leg is hurting; no pronator drift or obvious extremity weakness PSYCH: The patient's mood and manner are appropriate. Grooming and personal hygiene are appropriate.    MEDICAL DECISION MAKING: Patient here with group home staff member stating that she is acting different for the past several days. They feel that she is moving her right side abnormally but do not state that they feel that her side is weaker than normal. They feel that they may notice some slight drooping of the lower right eyelid and a little bit of slurring of her speech. She was seen by her primary care physician this week who evaluated her. When they called the office again today they instructed them to come to the emergency department for further evaluation. She has no risk factors for stroke. I feel her NIH stroke scale is 0-1. If  she is having a stroke her symptoms are very mild and she is outside of any TPA window. I also feel there could be other organic causes for her symptoms such as UTI, electrolyte abnormality, anemia. I have called patient's mother [name] who is her guardian and power of attorney. Discussed with her my findings and that I feel this is less likely a stroke but the only way we can truly rule this out would be to perform an MRI of her brain. Given patient is severely mentally retarded think this would be very difficult to do the patient would need sedation and probably intubation. The mother agrees that at this time she would not want that test performed that she agrees that sedation and intubation would be a risk and that the risk would outweigh any benefits. She agrees with medical workup, head CT and if workup normal discharge back to the group home to follow-up with her primary care physician.  ED PROGRESS: Patient's labs are unremarkable. Phenobarbital level is normal at 22.6. Head CT shows no acute intracranial abnormality. Urine does show leukocytes and hemoglobin. Culture pending. We'll start  her on Keflex for possible UTI. Discussed this with group home staff member who agrees with plan. Discussed strict return precautions. They verbalize understanding and is comfortable with plan.    I personally performed the services described in this documentation, which was scribed in my presence. The recorded information has been reviewed and is accurate.    Christine Maw Ward, DO 10/05/14 1702

## 2014-10-05 NOTE — Discharge Instructions (Signed)

## 2014-10-06 LAB — URINE CULTURE

## 2014-10-08 ENCOUNTER — Emergency Department (HOSPITAL_COMMUNITY): Payer: Medicare Other

## 2014-10-08 ENCOUNTER — Inpatient Hospital Stay (HOSPITAL_COMMUNITY)
Admission: EM | Admit: 2014-10-08 | Discharge: 2014-10-11 | DRG: 948 | Disposition: A | Discharge status: Skilled Nursing Facility | Payer: Medicare Other | Attending: Internal Medicine | Admitting: Internal Medicine

## 2014-10-08 ENCOUNTER — Encounter (HOSPITAL_COMMUNITY): Payer: Self-pay | Admitting: Emergency Medicine

## 2014-10-08 DIAGNOSIS — Z7401 Bed confinement status: Secondary | ICD-10-CM | POA: Diagnosis not present

## 2014-10-08 DIAGNOSIS — N390 Urinary tract infection, site not specified: Secondary | ICD-10-CM | POA: Diagnosis not present

## 2014-10-08 DIAGNOSIS — E669 Obesity, unspecified: Secondary | ICD-10-CM | POA: Diagnosis not present

## 2014-10-08 DIAGNOSIS — F79 Unspecified intellectual disabilities: Secondary | ICD-10-CM

## 2014-10-08 DIAGNOSIS — F29 Unspecified psychosis not due to a substance or known physiological condition: Secondary | ICD-10-CM | POA: Diagnosis not present

## 2014-10-08 DIAGNOSIS — E876 Hypokalemia: Secondary | ICD-10-CM | POA: Diagnosis not present

## 2014-10-08 DIAGNOSIS — E784 Other hyperlipidemia: Secondary | ICD-10-CM | POA: Diagnosis not present

## 2014-10-08 DIAGNOSIS — G40901 Epilepsy, unspecified, not intractable, with status epilepticus: Secondary | ICD-10-CM | POA: Diagnosis present

## 2014-10-08 DIAGNOSIS — T50995A Adverse effect of other drugs, medicaments and biological substances, initial encounter: Secondary | ICD-10-CM | POA: Diagnosis present

## 2014-10-08 DIAGNOSIS — R4182 Altered mental status, unspecified: Principal | ICD-10-CM | POA: Diagnosis present

## 2014-10-08 DIAGNOSIS — Z792 Long term (current) use of antibiotics: Secondary | ICD-10-CM

## 2014-10-08 DIAGNOSIS — R40241 Glasgow coma scale score 13-15: Secondary | ICD-10-CM | POA: Diagnosis not present

## 2014-10-08 DIAGNOSIS — Z79899 Other long term (current) drug therapy: Secondary | ICD-10-CM | POA: Diagnosis not present

## 2014-10-08 DIAGNOSIS — R000 Tachycardia, unspecified: Secondary | ICD-10-CM | POA: Diagnosis not present

## 2014-10-08 DIAGNOSIS — E785 Hyperlipidemia, unspecified: Secondary | ICD-10-CM | POA: Diagnosis not present

## 2014-10-08 DIAGNOSIS — I10 Essential (primary) hypertension: Secondary | ICD-10-CM | POA: Diagnosis present

## 2014-10-08 DIAGNOSIS — N3289 Other specified disorders of bladder: Secondary | ICD-10-CM | POA: Diagnosis not present

## 2014-10-08 DIAGNOSIS — R569 Unspecified convulsions: Secondary | ICD-10-CM

## 2014-10-08 DIAGNOSIS — Z8249 Family history of ischemic heart disease and other diseases of the circulatory system: Secondary | ICD-10-CM | POA: Diagnosis not present

## 2014-10-08 DIAGNOSIS — Z6837 Body mass index (BMI) 37.0-37.9, adult: Secondary | ICD-10-CM | POA: Diagnosis not present

## 2014-10-08 DIAGNOSIS — F10120 Alcohol abuse with intoxication, uncomplicated: Secondary | ICD-10-CM | POA: Diagnosis not present

## 2014-10-08 DIAGNOSIS — R251 Tremor, unspecified: Secondary | ICD-10-CM | POA: Diagnosis not present

## 2014-10-08 DIAGNOSIS — R269 Unspecified abnormalities of gait and mobility: Secondary | ICD-10-CM | POA: Diagnosis not present

## 2014-10-08 LAB — AMMONIA: AMMONIA: 20 umol/L (ref 11–32)

## 2014-10-08 LAB — BASIC METABOLIC PANEL
ANION GAP: 6 (ref 5–15)
BUN: 12 mg/dL (ref 6–23)
CO2: 28 mmol/L (ref 19–32)
Calcium: 9.5 mg/dL (ref 8.4–10.5)
Chloride: 107 mEq/L (ref 96–112)
Creatinine, Ser: 0.57 mg/dL (ref 0.50–1.10)
GFR calc Af Amer: >90 mL/min (ref >90)
GLUCOSE: 121 mg/dL — AB (ref 70–99)
POTASSIUM: 3.5 mmol/L (ref 3.5–5.1)
Sodium: 141 mmol/L (ref 135–145)

## 2014-10-08 LAB — URINALYSIS, ROUTINE W REFLEX MICROSCOPIC
Bilirubin Urine: NEGATIVE
Glucose, UA: NEGATIVE mg/dL
Ketones, ur: NEGATIVE mg/dL
LEUKOCYTES UA: NEGATIVE
Nitrite: NEGATIVE
PH: 7 (ref 5.0–8.0)
Protein, ur: 100 mg/dL — AB
SPECIFIC GRAVITY, URINE: 1.025 (ref 1.005–1.030)
Urobilinogen, UA: 1 mg/dL (ref 0.0–1.0)

## 2014-10-08 LAB — CBC WITH DIFFERENTIAL/PLATELET
Basophils Absolute: 0 10*3/uL (ref 0.0–0.1)
Basophils Relative: 0 % (ref 0–1)
Eosinophils Absolute: 0 10*3/uL (ref 0.0–0.7)
Eosinophils Relative: 0 % (ref 0–5)
HCT: 35.7 % — ABNORMAL LOW (ref 36.0–46.0)
HEMOGLOBIN: 12 g/dL (ref 12.0–15.0)
LYMPHS ABS: 1.4 10*3/uL (ref 0.7–4.0)
Lymphocytes Relative: 18 % (ref 12–46)
MCH: 29.1 pg (ref 26.0–34.0)
MCHC: 33.6 g/dL (ref 30.0–36.0)
MCV: 86.7 fL (ref 78.0–100.0)
Monocytes Absolute: 0.4 10*3/uL (ref 0.1–1.0)
Monocytes Relative: 5 % (ref 3–12)
Neutro Abs: 5.8 10*3/uL (ref 1.7–7.7)
Neutrophils Relative %: 77 % (ref 43–77)
Platelets: 330 10*3/uL (ref 150–400)
RBC: 4.12 MIL/uL (ref 3.87–5.11)
RDW: 12.4 % (ref 11.5–15.5)
WBC: 7.5 10*3/uL (ref 4.0–10.5)

## 2014-10-08 LAB — PHENOBARBITAL LEVEL: Phenobarbital: 27.3 ug/mL (ref 15.0–40.0)

## 2014-10-08 LAB — URINE MICROSCOPIC-ADD ON

## 2014-10-08 LAB — LACTIC ACID, PLASMA: Lactic Acid, Venous: 1.5 mmol/L (ref 0.5–2.2)

## 2014-10-08 MED ORDER — OXYBUTYNIN CHLORIDE 5 MG PO TABS
5.0000 mg | ORAL_TABLET | Freq: Every day | ORAL | Status: DC
  Administered 2014-10-09 – 2014-10-11 (×3): 5 mg via ORAL
  Filled 2014-10-08 (×3): qty 1

## 2014-10-08 MED ORDER — HEPARIN SODIUM (PORCINE) 5000 UNIT/ML IJ SOLN
5000.0000 [IU] | Freq: Three times a day (TID) | INTRAMUSCULAR | Status: DC
  Administered 2014-10-08 – 2014-10-10 (×7): 5000 [IU] via SUBCUTANEOUS
  Filled 2014-10-08 (×7): qty 1

## 2014-10-08 MED ORDER — ONDANSETRON HCL 4 MG/2ML IJ SOLN: 4.0000 mg | Freq: Four times a day (QID) | INTRAMUSCULAR | Status: DC | PRN

## 2014-10-08 MED ORDER — QUETIAPINE FUMARATE 25 MG PO TABS
50.0000 mg | ORAL_TABLET | Freq: Every day | ORAL | Status: DC
  Administered 2014-10-08 – 2014-10-10 (×3): 50 mg via ORAL
  Filled 2014-10-08 (×3): qty 2

## 2014-10-08 MED ORDER — PHENOBARBITAL 97.2 MG PO TABS
194.4000 mg | ORAL_TABLET | ORAL | Status: DC
  Administered 2014-10-09: 194.4 mg via ORAL
  Filled 2014-10-08: qty 2

## 2014-10-08 MED ORDER — ONDANSETRON HCL 4 MG PO TABS: 4.0000 mg | ORAL_TABLET | Freq: Four times a day (QID) | ORAL | Status: DC | PRN

## 2014-10-08 MED ORDER — LEVETIRACETAM 500 MG PO TABS
500.0000 mg | ORAL_TABLET | Freq: Two times a day (BID) | ORAL | Status: DC
  Administered 2014-10-08 – 2014-10-11 (×6): 500 mg via ORAL
  Filled 2014-10-08 (×2): qty 1
  Filled 2014-10-08: qty 2
  Filled 2014-10-08 (×3): qty 1

## 2014-10-08 MED ORDER — SIMVASTATIN 20 MG PO TABS
20.0000 mg | ORAL_TABLET | Freq: Every day | ORAL | Status: DC
  Administered 2014-10-09 – 2014-10-11 (×3): 20 mg via ORAL
  Filled 2014-10-08 (×3): qty 1

## 2014-10-08 MED ORDER — DEXTROSE 5 % IV SOLN
INTRAVENOUS | Status: AC
  Filled 2014-10-08: qty 10

## 2014-10-08 MED ORDER — DEXTROSE 5 % IV SOLN
1.0000 g | Freq: Once | INTRAVENOUS | Status: AC
  Administered 2014-10-08: 1 g via INTRAVENOUS
  Filled 2014-10-08: qty 10

## 2014-10-08 MED ORDER — LAMOTRIGINE 25 MG PO TABS
ORAL_TABLET | ORAL | Status: AC
  Filled 2014-10-08: qty 1

## 2014-10-08 MED ORDER — PHENOBARBITAL 32.4 MG PO TABS: 129.6000 mg | ORAL_TABLET | ORAL | Status: DC

## 2014-10-08 MED ORDER — LAMOTRIGINE 25 MG PO TABS
25.0000 mg | ORAL_TABLET | Freq: Two times a day (BID) | ORAL | Status: DC
  Administered 2014-10-08 – 2014-10-10 (×4): 25 mg via ORAL
  Filled 2014-10-08 (×8): qty 1

## 2014-10-08 MED ORDER — CHLORHEXIDINE GLUCONATE 0.12 % MT SOLN
10.0000 mL | Freq: Four times a day (QID) | OROMUCOSAL | Status: DC
  Administered 2014-10-08 – 2014-10-11 (×10): 10 mL via OROMUCOSAL
  Filled 2014-10-08 (×9): qty 15

## 2014-10-08 NOTE — ED Notes (Signed)
Per EMS- staff at [facility] report pt ins not acting right and is shaking. Pt seen in ED on Wednesday and dx with UTI.

## 2014-10-08 NOTE — ED Provider Notes (Signed)
CSN: [id number]     Arrival date & time 10/08/14  1335 History  This chart was scribed for Glynn Octave, MD by Freida Busman, ED Scribe. This patient was seen in room APA06/APA06 and the patient's care was started 2:52 PM.    Chief Complaint  Patient presents with  . Altered Mental Status   Level 5 caveat due to pt's mental status (MR)    The history is provided by a relative and a caregiver. No language interpreter was used.    HPI Comments:  Christine Pena is a 59 y.o. female with a h/o mental retardation who presents to the Emergency Department with group home caregiver who reports garbled speech intermittently for 3 days. Caregiver also reports shaking to all extremities for about 2 weeks and right sided weakness for about 2-3 days. Caregiver notes pt was so weak she needed to be placed in a wheel chair today; she is usually able to ambulate with assistance. Pt was recently seen in the ED for similar symptoms; she was diagnosed with UTI and placed on keflex. Caregiver states pt seems worse than previous visit. Pt has a h/o seizure with her last seizure on 09/22/2014. Caregiver notes symptoms today are dissimilar to seizure symptoms.  She also denies fever.     Past Medical History  Diagnosis Date  . Hypertension   . Seizures   . Mental retardation    History reviewed. No pertinent past surgical history. Family History  Problem Relation Age of Onset  . Heart disease Mother    History  Substance Use Topics  . Smoking status: Never Smoker   . Smokeless tobacco: Never Used  . Alcohol Use: No   OB History    No data available     Review of Systems  Unable to perform ROS: Other  Mental Retardation     Allergies  Haldol  Home Medications   Prior to Admission medications   Medication Sig Start Date End Date Taking? Authorizing Provider  cephALEXin (KEFLEX) 500 MG capsule Take 1 capsule (500 mg total) by mouth 2 (two) times daily. 10/05/14  Yes Kristen N Ward, DO   chlorhexidine (PERIDEX) 0.12 % solution Use as directed 10 mLs in the mouth or throat 4 (four) times daily.   Yes Historical Provider, MD  hydrocortisone (ANUSOL-HC) 2.5 % rectal cream Place 1 application rectally 2 (two) times daily.   Yes Historical Provider, MD  lamoTRIgine (LAMICTAL) 25 MG tablet Take 1 tablet (25 mg total) by mouth 2 (two) times daily. 05/05/14  Yes Lesle Chris Black, NP  levETIRAcetam (KEPPRA) 500 MG tablet Take 1 tablet (500 mg total) by mouth 2 (two) times daily. 05/05/14  Yes Lesle Chris Black, NP  moexipril (UNIVASC) 7.5 MG tablet Take 7.5 mg by mouth daily.  12/30/13  Yes Historical Provider, MD  oxybutynin (DITROPAN) 5 MG tablet Take 5 mg by mouth daily.  12/30/13  Yes Historical Provider, MD  paliperidone (INVEGA) 6 MG 24 hr tablet Take 6 mg by mouth daily.   Yes Historical Provider, MD  PHENobarbital (LUMINAL) 64.8 MG tablet Take 129.6-194.4 mg by mouth See admin instructions. Takes 2 tablets at bedtime, alternating with 3 tablets the next night; repeat. 12/30/13  Yes Historical Provider, MD  QUEtiapine (SEROQUEL) 50 MG tablet Take 50 mg by mouth at bedtime.   Yes Historical Provider, MD  simvastatin (ZOCOR) 20 MG tablet Take 20 mg by mouth daily.  12/30/13  Yes Historical Provider, MD  vitamin E 200 UNIT capsule  Take 200 Units by mouth daily.   Yes Historical Provider, MD  Vitamin D, Ergocalciferol, (DRISDOL) 50000 UNITS CAPS capsule Take 50,000 Units by mouth every 7 (seven) days. Takes on Saturdays.    Historical Provider, MD   BP 142/75 mmHg  Pulse 94  Temp(Src) 98.3 F (36.8 C) (Oral)  Resp 20  Ht 5\' 2"  (1.575 m)  Wt 202 lb 13.2 oz (92 kg)  BMI 37.09 kg/m2  SpO2 97% Physical Exam  Constitutional: No distress.  Well-appearing  HENT:  Head: Normocephalic and atraumatic.  Mouth/Throat: Oropharynx is clear and moist. No oropharyngeal exudate.  Eyes: Conjunctivae and EOM are normal. Pupils are equal, round, and reactive to light.  Neck: Normal range of motion. Neck supple.   No meningismus.  Cardiovascular: Normal rate, regular rhythm, normal heart sounds and intact distal pulses.   No murmur heard. Pulmonary/Chest: Effort normal and breath sounds normal. No respiratory distress.  Abdominal: Soft. There is no tenderness. There is no rebound and no guarding.  Musculoskeletal: Normal range of motion. She exhibits no edema or tenderness.  Neurological: She is alert. No cranial nerve deficit. She exhibits normal muscle tone.  Oriented x 2  Appears arousbale but somnolent  Does not follow commands  Moves all extremities spontaneously. Mild dysarthria No appreciable facial droop   Skin: Skin is warm.  Bilateral erythema to knees   Psychiatric: She has a normal mood and affect. Her behavior is normal.  Nursing note and vitals reviewed.   ED Course  Procedures   DIAGNOSTIC STUDIES:  Oxygen Saturation is 94% on RA, adequate by my interpretation.    COORDINATION OF CARE:  3:06 PM Discussed treatment plan with pt's family and caregiver at bedside   Labs Review Labs Reviewed  CBC WITH DIFFERENTIAL - Abnormal; Notable for the following:    HCT 35.7 (*)    All other components within normal limits  BASIC METABOLIC PANEL - Abnormal; Notable for the following:    Glucose, Bld 121 (*)    All other components within normal limits  URINALYSIS, ROUTINE W REFLEX MICROSCOPIC - Abnormal; Notable for the following:    Hgb urine dipstick TRACE (*)    Protein, ur 100 (*)    All other components within normal limits  CULTURE, BLOOD (ROUTINE X 2)  CULTURE, BLOOD (ROUTINE X 2)  PHENOBARBITAL LEVEL  URINE MICROSCOPIC-ADD ON  AMMONIA  LACTIC ACID, PLASMA  COMPREHENSIVE METABOLIC PANEL  CBC  URINE RAPID DRUG SCREEN (HOSP PERFORMED)    Imaging Review Dg Chest 1 View  10/08/2014   CLINICAL DATA:  Cough, weakness, altered mental status, hypertension, history seizures  EXAM: CHEST - 1 VIEW  COMPARISON:  05/03/2014  FINDINGS: Borderline enlargement of cardiac  silhouette.  Mediastinal contours and pulmonary vascularity normal.  Lungs clear.  No pleural effusion or pneumothorax.  Bones demineralized.  IMPRESSION: No acute abnormalities.   Electronically Signed   By: Ulyses SouthwardMark  Boles M.D.   On: 10/08/2014 15:31   Ct Head Wo Contrast  10/08/2014   CLINICAL DATA:  Altered mental status. Recent dx of UTI. Pt lives in group home care. Caregiver states pt has had garbled speech intermittently x 3 days. Hx of HTN, seizures,mental retardation. Last seizure 09/22/14  EXAM: CT HEAD WITHOUT CONTRAST  TECHNIQUE: Contiguous axial images were obtained from the base of the skull through the vertex without intravenous contrast.  COMPARISON:  10/05/2014.  FINDINGS: Marked diffuse cerebellar atrophy is unchanged. The ventricles remain normal in size and position. No intracranial hemorrhage,  mass lesion or CT evidence of acute infarction. Unremarkable bones and included paranasal sinuses.  IMPRESSION: Stable marked cerebellar atrophy.  No acute abnormality.   Electronically Signed   By: Gordan Payment M.D.   On: 10/08/2014 16:06     EKG Interpretation None      MDM   Final diagnoses:  Altered mental status   Patient from group home with behavior change over the past 3 days. Diagnosed with UTI 3 days ago and has been receiving keflex doses intermittently. Family reports she is less responsive, more confused, not as alert and having shaking of her arms and legs. Patient does not follow commands in the ED.  CT head stable. Chest x-ray negative. Phenobarbital level therapeutic. No fever or leukocytosis.  Patient not at baseline per family members. She is not following commands. She is confused and not as alert. Possibly related to UTI for which she is partially treated. Urine culture from 1/13 with multiple morphotypes.  Suspect sedating meds contributing to patient AMS.  No meningismus.  Doubt CNS infection. No fever.  Lactate and ammonia normal. May need MRI to assess for  infarct.  Admission dw Dr. Konrad Dolores.    I personally performed the services described in this documentation, which was scribed in my presence. The recorded information has been reviewed and is accurate.     Glynn Octave, MD 10/08/14 [phone]

## 2014-10-08 NOTE — H&P (Signed)
[facility] History and Physical  Christine EagleDebra L Lefebre ZOX:[id number] DOB: May 18, 1956 DOA: 10/08/2014  Referring physician: Dr. Manus Gunningancour - [facility] PCP: [name], [name], MD   Chief Complaint: AMS  HPI: Christine EagleDebra L Pena is a 59 y.o. female  Presenting to ED w/ 3 weeks of worsening functional status. Previously able to walk. Pt lives in Group home ([facility]). Family notes her mental status and behavior has continued to deteriorate. Pt w/ poor attention to detail since that time. Poor strength during this time. Developed stuttering. PT dx on 10/05/13 at [facility] for UTI and treated with Keflex. Per Assisted living records show keflex  only given Qday.   Per staff at group home, pt has been garbling her speech and becoming physically more weak for the past week. Today pt unable to bear weight and very weak. No VS instability and tolerating PO as nml. Pt has worsened since ED visit on 10/05/14.   Added invega in December   Small Seizures x4 on 09/22/14  Review of Systems:  Per HPI otherwise unable to obtain due to pts mental status  Past Medical History  Diagnosis Date  . Hypertension   . Seizures   . Mental retardation    History reviewed. No pertinent past surgical history. Social History:  reports that she has never smoked. She has never used smokeless tobacco. She reports that she does not drink alcohol or use illicit drugs.  Allergies  Allergen Reactions  . Haldol [Haloperidol Lactate]     Unknown reaction.    Family History  Problem Relation Age of Onset  . Heart disease Mother      Prior to Admission medications   Medication Sig Start Date End Date Taking? Authorizing Provider  cephALEXin (KEFLEX) 500 MG capsule Take 1 capsule (500 mg total) by mouth 2 (two) times daily. 10/05/14  Yes Kristen N Ward, DO  chlorhexidine (PERIDEX) 0.12 % solution Use as directed 10 mLs in the mouth or throat 4 (four) times daily.   Yes Historical Provider, MD  hydrocortisone (ANUSOL-HC) 2.5 %  rectal cream Place 1 application rectally 2 (two) times daily.   Yes Historical Provider, MD  lamoTRIgine (LAMICTAL) 25 MG tablet Take 1 tablet (25 mg total) by mouth 2 (two) times daily. 05/05/14  Yes Lesle ChrisKaren M Black, NP  levETIRAcetam (KEPPRA) 500 MG tablet Take 1 tablet (500 mg total) by mouth 2 (two) times daily. 05/05/14  Yes Lesle ChrisKaren M Black, NP  moexipril (UNIVASC) 7.5 MG tablet Take 7.5 mg by mouth daily.  12/30/13  Yes Historical Provider, MD  oxybutynin (DITROPAN) 5 MG tablet Take 5 mg by mouth daily.  12/30/13  Yes Historical Provider, MD  paliperidone (INVEGA) 6 MG 24 hr tablet Take 6 mg by mouth daily.   Yes Historical Provider, MD  PHENobarbital (LUMINAL) 64.8 MG tablet Take 129.6-194.4 mg by mouth See admin instructions. Takes 2 tablets at bedtime, alternating with 3 tablets the next night; repeat. 12/30/13  Yes Historical Provider, MD  QUEtiapine (SEROQUEL) 50 MG tablet Take 50 mg by mouth at bedtime.   Yes Historical Provider, MD  simvastatin (ZOCOR) 20 MG tablet Take 20 mg by mouth daily.  12/30/13  Yes Historical Provider, MD  vitamin E 200 UNIT capsule Take 200 Units by mouth daily.   Yes Historical Provider, MD  Vitamin D, Ergocalciferol, (DRISDOL) 50000 UNITS CAPS capsule Take 50,000 Units by mouth every 7 (seven) days. Takes on Saturdays.    Historical Provider, MD   Physical Exam: Filed Vitals:  10/08/14 1500 10/08/14 1703 10/08/14 1751 10/08/14 1850  BP: 149/74 150/86 156/77   Pulse:  90 91   Temp:   98.1 F (36.7 C) 99.9 F (37.7 C)  TempSrc:   Oral Rectal  Resp:  16 16   Height:      Weight:      SpO2:  95% 95%     Wt Readings from Last 3 Encounters:  10/08/14 112.492 kg (248 lb)  05/05/14 112.56 kg (248 lb 2.4 oz)  01/06/14 112.492 kg (248 lb)    General:  Appears mildly agitated Eyes:  PERRL, normal lids, irises & conjunctiva ENT:  grossly normal hearing, lips & tongue Neck:  no LAD, masses or thyromegaly Cardiovascular:  RRR, no m/r/g. Fatty LE w/ trace  edema. Telemetry:  SR, no arrhythmias  Respiratory:  CTA bilaterally, no w/r/r. Normal respiratory effort. Abdomen:  soft, ntnd Skin:  no rash or induration seen on limited exam Musculoskeletal:  grossly normal tone BUE/BLE Psychiatric: Pt follows commands, nonverbal.  Neurologic:  grossly non-focal.          Labs on Admission:  Basic Metabolic Panel:  Recent Labs Lab 10/05/14 1344 10/08/14 1343  NA 135 141  K 3.6 3.5  CL 103 107  CO2 26 28  GLUCOSE 101* 121*  BUN 10 12  CREATININE 0.96 0.57  CALCIUM 9.2 9.5   Liver Function Tests: No results for input(s): AST, ALT, ALKPHOS, BILITOT, PROT, ALBUMIN in the last 168 hours. No results for input(s): LIPASE, AMYLASE in the last 168 hours. No results for input(s): AMMONIA in the last 168 hours. CBC:  Recent Labs Lab 10/05/14 1344 10/08/14 1343  WBC 7.1 7.5  NEUTROABS 4.4 5.8  HGB 12.5 12.0  HCT 37.1 35.7*  MCV 86.9 86.7  PLT 326 330   Cardiac Enzymes: No results for input(s): CKTOTAL, CKMB, CKMBINDEX, TROPONINI in the last 168 hours.  BNP (last 3 results) No results for input(s): PROBNP in the last 8760 hours. CBG: No results for input(s): GLUCAP in the last 168 hours.  Radiological Exams on Admission: Dg Chest 1 View  10/08/2014   CLINICAL DATA:  Cough, weakness, altered mental status, hypertension, history seizures  EXAM: CHEST - 1 VIEW  COMPARISON:  05/03/2014  FINDINGS: Borderline enlargement of cardiac silhouette.  Mediastinal contours and pulmonary vascularity normal.  Lungs clear.  No pleural effusion or pneumothorax.  Bones demineralized.  IMPRESSION: No acute abnormalities.   Electronically Signed   By: Ulyses Southward M.D.   On: 10/08/2014 15:31   Ct Head Wo Contrast  10/08/2014   CLINICAL DATA:  Altered mental status. Recent dx of UTI. Pt lives in group home care. Caregiver states pt has had garbled speech intermittently x 3 days. Hx of HTN, seizures,mental retardation. Last seizure 09/22/14  EXAM: CT HEAD  WITHOUT CONTRAST  TECHNIQUE: Contiguous axial images were obtained from the base of the skull through the vertex without intravenous contrast.  COMPARISON:  10/05/2014.  FINDINGS: Marked diffuse cerebellar atrophy is unchanged. The ventricles remain normal in size and position. No intracranial hemorrhage, mass lesion or CT evidence of acute infarction. Unremarkable bones and included paranasal sinuses.  IMPRESSION: Stable marked cerebellar atrophy.  No acute abnormality.   Electronically Signed   By: Gordan Payment M.D.   On: 10/08/2014 16:06    EKG: Ordered  Assessment/Plan Principal Problem:   Altered mental status Active Problems:   Mental retardation   Seizure   Essential hypertension   HLD (hyperlipidemia)  58yo w/  PMH of MR (fetal injury), seizures, HTN, HLD living in a group home presenting w/ worsening cognitive acuity and progressive weakness  AMS: Associated w/ generalized weakness. 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 Etiology unclear but suspect polypharmacy vs infectious. WBC 7.5, electrolytes nml. UA unimpressive today and UCX unremarkable from previous ED visit (30K colonies of multimorphotypes). Invega added ~1-2 weeks before onset of symptoms. Last seizure 16 days ago. No focal deficits though stroke still a possibility. Unlikely acute infectious intracranial process.  - Admit - tele - Requested medical records from Group home (faxing to 3rd floor) - Lactic acid - Ammonia - +/- MRI if not improving by morning - UDS - PT/OT - Hold invega - BCX (CTX already given) - continue home Seroquel,  - hold ABX for UTI.   Seizures: last seizure activity 17 days prior to admission. Phenobarb level 27.3 - continue lamictal - continue Keppra - continue phenobarb - consider neuro consult when available to taper medications (some being used for mood disturbance)  HTN: normotensive - continue Univasc,   HLD: - continue  zocor  Bladder spasm: - continue oxybutynin    Code Status: FULL DVT Prophylaxis: Hep Family Communication: Mother and Brother, and coordinator at group home Disposition Plan: pending improvement   MERRELL, DAVID Shela Commons, MD Family Medicine [facility] [email or web address] Password [email or web address]

## 2014-10-08 NOTE — ED Notes (Signed)
Patient was seen here on 10/05/13 for UTI and put on Keflex BID.  [facility] show she did not receive a dose on 1/13 in the evening.  Recv'd only PM dose on 1/14 and only AM dose on 1/15.  She did rec'v a dose today.

## 2014-10-09 LAB — COMPREHENSIVE METABOLIC PANEL
ALK PHOS: 52 U/L (ref 39–117)
ALT: 12 U/L (ref 0–35)
AST: 14 U/L (ref 0–37)
Albumin: 3.4 g/dL — ABNORMAL LOW (ref 3.5–5.2)
Anion gap: 7 (ref 5–15)
BUN: 8 mg/dL (ref 6–23)
CO2: 28 mmol/L (ref 19–32)
Calcium: 9.1 mg/dL (ref 8.4–10.5)
Chloride: 108 mEq/L (ref 96–112)
Creatinine, Ser: 0.5 mg/dL (ref 0.50–1.10)
Glucose, Bld: 98 mg/dL (ref 70–99)
POTASSIUM: 3.1 mmol/L — AB (ref 3.5–5.1)
Sodium: 143 mmol/L (ref 135–145)
Total Bilirubin: 0.3 mg/dL (ref 0.3–1.2)
Total Protein: 6 g/dL (ref 6.0–8.3)

## 2014-10-09 LAB — CBC
HCT: 31.7 % — ABNORMAL LOW (ref 36.0–46.0)
Hemoglobin: 10.9 g/dL — ABNORMAL LOW (ref 12.0–15.0)
MCH: 29.9 pg (ref 26.0–34.0)
MCHC: 34.4 g/dL (ref 30.0–36.0)
MCV: 86.8 fL (ref 78.0–100.0)
Platelets: 270 10*3/uL (ref 150–400)
RBC: 3.65 MIL/uL — AB (ref 3.87–5.11)
RDW: 12.4 % (ref 11.5–15.5)
WBC: 4.8 10*3/uL (ref 4.0–10.5)

## 2014-10-09 LAB — TSH: TSH: 1.481 u[IU]/mL (ref 0.350–4.500)

## 2014-10-09 MED ORDER — LORAZEPAM 2 MG/ML IJ SOLN
2.0000 mg | Freq: Once | INTRAMUSCULAR | Status: AC
  Administered 2014-10-09: 2 mg via INTRAVENOUS

## 2014-10-09 MED ORDER — LEVETIRACETAM IN NACL 1000 MG/100ML IV SOLN
1000.0000 mg | Freq: Once | INTRAVENOUS | Status: DC
  Filled 2014-10-09: qty 100

## 2014-10-09 MED ORDER — LORAZEPAM 2 MG/ML IJ SOLN
INTRAMUSCULAR | Status: AC
  Filled 2014-10-09: qty 1

## 2014-10-09 MED ORDER — LEVETIRACETAM IN NACL 500 MG/100ML IV SOLN
INTRAVENOUS | Status: AC
  Filled 2014-10-09: qty 200

## 2014-10-09 MED ORDER — LEVETIRACETAM IN NACL 500 MG/100ML IV SOLN
1000.0000 mg | Freq: Once | INTRAVENOUS | Status: AC
  Administered 2014-10-10: 1000 mg via INTRAVENOUS
  Filled 2014-10-09: qty 200

## 2014-10-09 MED ORDER — LEVETIRACETAM IN NACL 1000 MG/100ML IV SOLN: 1000.0000 mg | Freq: Once | INTRAVENOUS | Status: DC

## 2014-10-09 NOTE — Progress Notes (Signed)
MD informed that lab was unable to get blood draw and okay was given to draw blood for patient's foot.

## 2014-10-09 NOTE — Progress Notes (Signed)
[facility] PROGRESS NOTE  Gareth EagleDebra L Smiddy WGN:[id number] DOB: 1956-05-28 DOA: 10/08/2014 PCP: [name], [name], MD  Assessment/Plan: Weakness/Lethargy -I suspect main culprit to be polypharmacy altho recurrent seizures is also a possibility. -Check EEG. -Will request neurology consult for assistance with medications. -Will request PT eval. -Check TSH/B12. -See no need for MRI at present as she has no focal abnormalities to suspect CVA. Hinda Glatter-Invega (most recently added med) has been on hold since admission.  Seizure Disorder -As above, will request neuro consult for assistance managing seizure meds (some are being used for mood disorder as well).  Hyperlipidemia -Continue zocor  HTN -Well controlled   Code Status: Full Code Family Communication: Will call facility in am.  Disposition Plan: To be determined   Consultants:  Neurology   Antibiotics:  None   Subjective: No complaints. Says repetitively "I want to see my mom".  Objective: Filed Vitals:   10/08/14 1850 10/08/14 1952 10/09/14 0647 10/09/14 1300  BP:  142/75 110/56 167/84  Pulse:  94 90 111  Temp: 99.9 F (37.7 C) 98.3 F (36.8 C) 98.8 F (37.1 C) 98.2 F (36.8 C)  TempSrc: Rectal Oral Oral Oral  Resp:  20 20 20   Height:  5\' 2"  (1.575 m)    Weight:  92 kg (202 lb 13.2 oz)    SpO2:  97% 98% 96%    Intake/Output Summary (Last 24 hours) at 10/09/14 1734 Last data filed at 10/09/14 1540  Gross per 24 hour  Intake    600 ml  Output      4 ml  Net    596 ml   Filed Weights   10/08/14 1335 10/08/14 1952  Weight: 112.492 kg (248 lb) 92 kg (202 lb 13.2 oz)    Exam:   General:  Awake, unable to fully assess orientation  Cardiovascular: RRR  Respiratory: CTA B  Abdomen: obese/S/ND/+BS  Extremities: trace bilateral edema    Data Reviewed: Basic Metabolic Panel:  Recent Labs Lab 10/05/14 1344 10/08/14 1343 10/09/14 0547  NA 135 141 143  K 3.6 3.5 3.1*  CL 103 107  108  CO2 26 28 28   GLUCOSE 101* 121* 98  BUN 10 12 8   CREATININE 0.96 0.57 0.50  CALCIUM 9.2 9.5 9.1   Liver Function Tests:  Recent Labs Lab 10/09/14 0547  AST 14  ALT 12  ALKPHOS 52  BILITOT 0.3  PROT 6.0  ALBUMIN 3.4*   No results for input(s): LIPASE, AMYLASE in the last 168 hours.  Recent Labs Lab 10/08/14 1856  AMMONIA 20   CBC:  Recent Labs Lab 10/05/14 1344 10/08/14 1343 10/09/14 0547  WBC 7.1 7.5 4.8  NEUTROABS 4.4 5.8  --   HGB 12.5 12.0 10.9*  HCT 37.1 35.7* 31.7*  MCV 86.9 86.7 86.8  PLT 326 330 270   Cardiac Enzymes: No results for input(s): CKTOTAL, CKMB, CKMBINDEX, TROPONINI in the last 168 hours. BNP (last 3 results) No results for input(s): PROBNP in the last 8760 hours. CBG: No results for input(s): GLUCAP in the last 168 hours.  Recent Results (from the past 240 hour(s))  Urine culture     Status: None   Collection Time: 10/05/14  1:45 PM  Result Value Ref Range Status   Specimen Description URINE, CLEAN CATCH  Final   Special Requests NONE  Final   Colony Count   Final    30,000 COLONIES/ML Performed at [facility]    Culture   Final  Multiple bacterial morphotypes present, none predominant. Suggest appropriate recollection if clinically indicated. Performed at [facility]    Report Status 10/06/2014 FINAL  Final  Blood culture (routine x 2)     Status: None (Preliminary result)   Collection Time: 10/08/14  7:02 PM  Result Value Ref Range Status   Specimen Description RIGHT ANTECUBITAL  Final   Special Requests BOTTLES DRAWN AEROBIC AND ANAEROBIC 5CC  Final   Culture NO GROWTH 1 DAY  Final   Report Status PENDING  Incomplete  Blood culture (routine x 2)     Status: None (Preliminary result)   Collection Time: 10/08/14  7:07 PM  Result Value Ref Range Status   Specimen Description BLOOD RIGHT HAND  Final   Special Requests BOTTLES DRAWN AEROBIC AND ANAEROBIC 6CC  Final   Culture NO GROWTH 1 DAY  Final    Report Status PENDING  Incomplete     Studies: Dg Chest 1 View  10/08/2014   CLINICAL DATA:  Cough, weakness, altered mental status, hypertension, history seizures  EXAM: CHEST - 1 VIEW  COMPARISON:  05/03/2014  FINDINGS: Borderline enlargement of cardiac silhouette.  Mediastinal contours and pulmonary vascularity normal.  Lungs clear.  No pleural effusion or pneumothorax.  Bones demineralized.  IMPRESSION: No acute abnormalities.   Electronically Signed   By: Ulyses Southward M.D.   On: 10/08/2014 15:31   Ct Head Wo Contrast  10/08/2014   CLINICAL DATA:  Altered mental status. Recent dx of UTI. Pt lives in group home care. Caregiver states pt has had garbled speech intermittently x 3 days. Hx of HTN, seizures,mental retardation. Last seizure 09/22/14  EXAM: CT HEAD WITHOUT CONTRAST  TECHNIQUE: Contiguous axial images were obtained from the base of the skull through the vertex without intravenous contrast.  COMPARISON:  10/05/2014.  FINDINGS: Marked diffuse cerebellar atrophy is unchanged. The ventricles remain normal in size and position. No intracranial hemorrhage, mass lesion or CT evidence of acute infarction. Unremarkable bones and included paranasal sinuses.  IMPRESSION: Stable marked cerebellar atrophy.  No acute abnormality.   Electronically Signed   By: Gordan Payment M.D.   On: 10/08/2014 16:06    Scheduled Meds: . chlorhexidine  10 mL Mouth/Throat QID  . heparin  5,000 Units Subcutaneous 3 times per day  . lamoTRIgine  25 mg Oral BID  . levETIRAcetam  500 mg Oral BID  . oxybutynin  5 mg Oral Daily  . PHENobarbital  129.6 mg Oral Q48H  . PHENobarbital  194.4 mg Oral Q48H  . QUEtiapine  50 mg Oral QHS  . simvastatin  20 mg Oral Daily   Continuous Infusions:   Principal Problem:   Altered mental status Active Problems:   Mental retardation   Seizure   Essential hypertension   HLD (hyperlipidemia)    Time spent: 35 minutes. Greater than 50% of this time was spent in direct contact  with the patient coordinating care.    Chaya Jan  [facility] Pager [phone]  If 7PM-7AM, please contact night-coverage at [email or web address], password [facility] 10/09/2014, 5:34 PM  LOS: 1 day

## 2014-10-10 ENCOUNTER — Inpatient Hospital Stay (HOSPITAL_COMMUNITY)
Admission: EM | Admit: 2014-10-10 | Discharge: 2014-10-10 | Disposition: A | Discharge status: Home / Self Care | Payer: Medicare Other | Source: Home / Self Care | Attending: Internal Medicine | Admitting: Internal Medicine

## 2014-10-10 DIAGNOSIS — E876 Hypokalemia: Secondary | ICD-10-CM | POA: Diagnosis not present

## 2014-10-10 LAB — MAGNESIUM: Magnesium: 1.9 mg/dL (ref 1.5–2.5)

## 2014-10-10 LAB — VITAMIN B12: Vitamin B-12: 273 pg/mL (ref 211–911)

## 2014-10-10 LAB — BASIC METABOLIC PANEL
Anion gap: 8 (ref 5–15)
BUN: 10 mg/dL (ref 6–23)
CALCIUM: 8.8 mg/dL (ref 8.4–10.5)
CO2: 27 mmol/L (ref 19–32)
CREATININE: 0.63 mg/dL (ref 0.50–1.10)
Chloride: 106 mEq/L (ref 96–112)
GFR calc Af Amer: >90 mL/min (ref >90)
Glucose, Bld: 106 mg/dL — ABNORMAL HIGH (ref 70–99)
Potassium: 3.4 mmol/L — ABNORMAL LOW (ref 3.5–5.1)
Sodium: 141 mmol/L (ref 135–145)

## 2014-10-10 MED ORDER — LAMOTRIGINE 25 MG PO TABS
50.0000 mg | ORAL_TABLET | Freq: Two times a day (BID) | ORAL | Status: DC
  Administered 2014-10-10 – 2014-10-11 (×2): 50 mg via ORAL
  Filled 2014-10-10 (×4): qty 2

## 2014-10-10 MED ORDER — POTASSIUM CHLORIDE 10 MEQ/100ML IV SOLN: 10.0000 meq | INTRAVENOUS | Status: DC

## 2014-10-10 MED ORDER — SODIUM CHLORIDE 0.9 % IV SOLN
INTRAVENOUS | Status: DC
  Administered 2014-10-10 – 2014-10-11 (×2): via INTRAVENOUS

## 2014-10-10 MED ORDER — PHENOBARBITAL 32.4 MG PO TABS
129.6000 mg | ORAL_TABLET | Freq: Every day | ORAL | Status: DC
  Administered 2014-10-10: 129.6 mg via ORAL
  Filled 2014-10-10: qty 4

## 2014-10-10 MED ORDER — ACETAMINOPHEN 325 MG PO TABS
650.0000 mg | ORAL_TABLET | ORAL | Status: DC | PRN
  Administered 2014-10-10 (×2): 650 mg via ORAL
  Filled 2014-10-10 (×2): qty 2

## 2014-10-10 MED ORDER — POTASSIUM CHLORIDE 10 MEQ/100ML IV SOLN
10.0000 meq | INTRAVENOUS | Status: AC
  Administered 2014-10-10 (×3): 10 meq via INTRAVENOUS
  Filled 2014-10-10 (×3): qty 100

## 2014-10-10 NOTE — Progress Notes (Signed)
Attempted to contact Pt's brother [name] to inform him that pt had a seizure during the night.Unable to reach [place] at this time at the number given.

## 2014-10-10 NOTE — Progress Notes (Addendum)
Called by nurse to evaluate patient for possible seizure, patient has history of mental retardation and seizure disorder. She takes Keppra, Lamictal, phenobarb. All these medications were given, but patient still having, right leg jerky movements, though she is alert, but does not respond and is nonverbal. As per nursing staff patient was more awake and alert during the daytime. Called and discussed with neurologist on call Dr. Amada JupiterKirkpatrick, who recommends to give Ativan 2 mg IV 1 and IV Keppra 1 g 1 If patient continues to have jerking movements/seizure, [facility] EEG monitoring as patient may be in partial status epilepticus. Also noted that patient potassium was 3.1, will repeat BMP and replace potassium.

## 2014-10-10 NOTE — Progress Notes (Signed)
[facility] PROGRESS NOTE  Christine Pena RUE:[id number] DOB: 03-20-1956 DOA: 10/08/2014 PCP: Colette Ribas, MD  Assessment/Plan: Weakness/Lethargy -concern for continuous seizure. Last night developed LE tremors/jerking. Given ativan and keppra IV with good results. This am re-occurrence of LE tremors. Await EEG. Await neurology input. Consider Ativan bolus. PT eval recommending snf.  TSH/B12 within the limits of normal. No need for MRI at present as she has no focal abnormalities to suspect CVA. Hinda Glatter (most recently added med) has been on hold since admission. Temp 101.5 axillary. Mild tachycardia. BP stable. Will provide gentle fluids   Seizure Disorder -As above, await neuro input for assistance managing seizure meds (some are being used for mood disorder as well). May need to consider transfer [facility] for continuous EEG. Await EEG.   Hyperlipidemia -Continue zocor  HTN -Well controlled    Code Status: full Family Communication: none present Disposition Plan: snf likely   Consultants:  neurology  Procedures:  none  Antibiotics:  none  HPI/Subjective: Eyes open, alert but not responding verbally.   Objective: Filed Vitals:   10/10/14 0932  BP: 92/71  Pulse: 97  Temp: 98.3 F (36.8 C)  Resp: 20    Intake/Output Summary (Last 24 hours) at 10/10/14 1004 Last data filed at 10/10/14 1478  Gross per 24 hour  Intake    500 ml  Output      2 ml  Net    498 ml   Filed Weights   10/08/14 1335 10/08/14 1952  Weight: 112.492 kg (248 lb) 92 kg (202 lb 13.2 oz)    Exam:   General:  Obese appears comfortable  Cardiovascular: RRR no mgr no LE edema  Respiratory: normal effort BS clear bilaterally no wheeze  Abdomen: obese, soft +BS non-tender to palpation  Musculoskeletal: joints without swelling/erythema  Neuro: alert, does no follow commands, non-verbal , LE with continuous tremor/jerking motion, not stiff.   Data Reviewed: Basic  Metabolic Panel:  Recent Labs Lab 10/05/14 1344 10/08/14 1343 10/09/14 0547 10/10/14 0048 10/10/14 0050  NA 135 141 143 141  --   K 3.6 3.5 3.1* 3.4*  --   CL 103 107 108 106  --   CO2 --   GLUCOSE 101* 121* 98 106*  --   BUN --   CREATININE 0.96 0.57 0.50 0.63  --   CALCIUM 9.2 9.5 9.1 8.8  --   MG  --   --   --   --  1.9   Liver Function Tests:  Recent Labs Lab 10/09/14 0547  AST 14  ALT 12  ALKPHOS 52  BILITOT 0.3  PROT 6.0  ALBUMIN 3.4*   No results for input(s): LIPASE, AMYLASE in the last 168 hours.  Recent Labs Lab 10/08/14 1856  AMMONIA 20   CBC:  Recent Labs Lab 10/05/14 1344 10/08/14 1343 10/09/14 0547  WBC 7.1 7.5 4.8  NEUTROABS 4.4 5.8  --   HGB 12.5 12.0 10.9*  HCT 37.1 35.7* 31.7*  MCV 86.9 86.7 86.8  PLT 326 330 270   Cardiac Enzymes: No results for input(s): CKTOTAL, CKMB, CKMBINDEX, TROPONINI in the last 168 hours. BNP (last 3 results) No results for input(s): PROBNP in the last 8760 hours. CBG: No results for input(s): GLUCAP in the last 168 hours.  Recent Results (from the past 240 hour(s))  Urine culture     Status: None   Collection Time: 10/05/14  1:45 PM  Result Value Ref Range Status   Specimen Description URINE, CLEAN CATCH  Final   Special Requests NONE  Final   Colony Count   Final    30,000 COLONIES/ML Performed at [facility]    Culture   Final    Multiple bacterial morphotypes present, none predominant. Suggest appropriate recollection if clinically indicated. Performed at [facility]    Report Status 10/06/2014 FINAL  Final  Blood culture (routine x 2)     Status: None (Preliminary result)   Collection Time: 10/08/14  7:02 PM  Result Value Ref Range Status   Specimen Description RIGHT ANTECUBITAL  Final   Special Requests BOTTLES DRAWN AEROBIC AND ANAEROBIC 5CC  Final   Culture NO GROWTH 1 DAY  Final   Report Status PENDING  Incomplete  Blood culture (routine x 2)      Status: None (Preliminary result)   Collection Time: 10/08/14  7:07 PM  Result Value Ref Range Status   Specimen Description BLOOD RIGHT HAND  Final   Special Requests BOTTLES DRAWN AEROBIC AND ANAEROBIC 6CC  Final   Culture NO GROWTH 1 DAY  Final   Report Status PENDING  Incomplete     Studies: Dg Chest 1 View  10/08/2014   CLINICAL DATA:  Cough, weakness, altered mental status, hypertension, history seizures  EXAM: CHEST - 1 VIEW  COMPARISON:  05/03/2014  FINDINGS: Borderline enlargement of cardiac silhouette.  Mediastinal contours and pulmonary vascularity normal.  Lungs clear.  No pleural effusion or pneumothorax.  Bones demineralized.  IMPRESSION: No acute abnormalities.   Electronically Signed   By: Ulyses SouthwardMark  Boles M.D.   On: 10/08/2014 15:31   Ct Head Wo Contrast  10/08/2014   CLINICAL DATA:  Altered mental status. Recent dx of UTI. Pt lives in group home care. Caregiver states pt has had garbled speech intermittently x 3 days. Hx of HTN, seizures,mental retardation. Last seizure 09/22/14  EXAM: CT HEAD WITHOUT CONTRAST  TECHNIQUE: Contiguous axial images were obtained from the base of the skull through the vertex without intravenous contrast.  COMPARISON:  10/05/2014.  FINDINGS: Marked diffuse cerebellar atrophy is unchanged. The ventricles remain normal in size and position. No intracranial hemorrhage, mass lesion or CT evidence of acute infarction. Unremarkable bones and included paranasal sinuses.  IMPRESSION: Stable marked cerebellar atrophy.  No acute abnormality.   Electronically Signed   By: Gordan PaymentSteve  Reid M.D.   On: 10/08/2014 16:06    Scheduled Meds: . chlorhexidine  10 mL Mouth/Throat QID  . heparin  5,000 Units Subcutaneous 3 times per day  . lamoTRIgine  25 mg Oral BID  . levETIRAcetam  500 mg Oral BID  . oxybutynin  5 mg Oral Daily  . PHENobarbital  129.6 mg Oral Q48H  . PHENobarbital  194.4 mg Oral Q48H  . QUEtiapine  50 mg Oral QHS  . simvastatin  20 mg Oral Daily    Continuous Infusions:   Principal Problem:   Altered mental status Active Problems:   Mental retardation   Seizure   Essential hypertension   HLD (hyperlipidemia)    Time spent: 35 minutes    [facility] Medical CenterBLACK,KAREN M  [facility] Pager [phone]. If 7PM-7AM, please contact night-coverage at [email or web address], password [email or web address] 10/10/2014, 10:04 AM  LOS: 2 days

## 2014-10-10 NOTE — Evaluation (Signed)
Physical Therapy Evaluation Patient Details Name: Christine Pena MRN: [id number] DOB: 08-Oct-1955 Today's Date: 10/10/2014   History of Present Illness  Christine Pena is a 59 y.o. female  Presenting to ED w/ 3 weeks of worsening functional status. Previously able to walk. Pt lives in Group home ([facility]). Family notes her mental status and behavior has continued to deteriorate. Pt w/ poor attention to detail since that time. Poor strength during this time. Developed stuttering. PT dx on 10/05/13 at [facility] for UTI and treated with Keflex. Per Assisted living records show keflex  only given Qday.  Per staff at group home, pt has been garbling her speech and becoming physically more weak for the past week. Today pt unable to bear weight and very weak. No VS instability and tolerating PO as nml. Pt has worsened since ED visit on 10/05/14.   Clinical Impression  Patient presented sitting at EOB with OT present, finishing eating breakfast. Patient able to perform dynamic reaching at EOB with Min guard to [place] assist. Patient presents with flat T-spine and cervical flexion, rounded shoulders, all of which appear to be chronic and long standing in nature. Unable to verbalize or effectively communicate with therapist at this time, family not present to establish baseline status. Sit to stand with Max(A)x2, stand pivot transfer to chair with Max(A)x2, Max cues. Max(A)x2 for safe repositioning in chair. Patient left reclined in chair with chair alarm activated and all needs otherwise met. Recommend SNF at discharge due to high level of assist for mobility.     Follow Up Recommendations SNF    Equipment Recommendations  None recommended by PT    Recommendations for Other Services       Precautions / Restrictions Precautions Precautions: Fall Precaution Comments: MR, non-verbal Restrictions Weight Bearing Restrictions: No      Mobility  Bed Mobility                  Transfers Overall  transfer level: Needs assistance   Transfers: Sit to/from Stand;Stand Pivot Transfers Sit to Stand: Max assist;+2 safety/equipment Stand pivot transfers: Max assist;+2 safety/equipment          Ambulation/Gait                Stairs            Wheelchair Mobility    Modified Rankin (Stroke Patients Only)       Balance Overall balance assessment: Needs assistance Sitting-balance support: No upper extremity supported Sitting balance-Leahy Scale: Good     Standing balance support: No upper extremity supported Standing balance-Leahy Scale: Fair                               Pertinent Vitals/Pain Pain Assessment: No/denies pain (no physical signs of pain)    Home Living Family/patient expects to be discharged to:: Group home                      Prior Function Level of Independence: Independent         Comments: family not present to confirm information     Hand Dominance        Extremity/Trunk Assessment               Lower Extremity Assessment: Overall WFL for tasks assessed      Cervical / Trunk Assessment: Kyphotic (flexion of c-spine, rounded shoulders)  Communication   Communication: Expressive  difficulties (fluctuating levels of cue following)  Cognition Arousal/Alertness: Awake/alert (slow to respond to cues and stimulation) Behavior During Therapy: Flat affect Overall Cognitive Status: No family/caregiver present to determine baseline cognitive functioning       Memory:  (MR)              General Comments      Exercises        Assessment/Plan    PT Assessment Patient needs continued PT services  PT Diagnosis Difficulty walking;Abnormality of gait;Altered mental status;Generalized weakness   PT Problem List Decreased strength;Decreased coordination;Decreased cognition;Decreased activity tolerance;Decreased knowledge of use of DME;Decreased balance;Decreased safety awareness;Decreased  mobility  PT Treatment Interventions Therapeutic exercise;DME instruction;Gait training;Balance training;Stair training;Neuromuscular re-education;Functional mobility training;Therapeutic activities;Patient/family education   PT Goals (Current goals can be found in the Care Plan section) Acute Rehab PT Goals Patient Stated Goal: none PT Goal Formulation: Patient unable to participate in goal setting Time For Goal Achievement: 10/24/14 Potential to Achieve Goals: Fair    Frequency Min 3X/week   Barriers to discharge Decreased caregiver support (previously lived at group home) needs high level of assist for mobility at this time    Co-evaluation PT/OT/SLP Co-Evaluation/Treatment: Yes Reason for Co-Treatment: For patient/therapist safety;Necessary to address cognition/behavior during functional activity PT goals addressed during session: Mobility/safety with mobility;Balance         End of Session Equipment Utilized During Treatment: Gait belt Activity Tolerance: Patient tolerated treatment well Patient left: in chair;with call bell/phone within reach;with chair alarm set (lift under her)           Time: 6191-5502 PT Time Calculation (min) (ACUTE ONLY): 22 min   Charges:   PT Evaluation $Initial PT Evaluation Tier I: 1 Procedure     PT G CodesHunt Oris PT, DPT 10/10/2014, 9:22 AM

## 2014-10-10 NOTE — Evaluation (Addendum)
Occupational Therapy Evaluation Patient Details Name: Christine Pena MRN: [id number] DOB: Mar 02, 1956 Today's Date: 10/10/2014    History of Present Illness Christine Pena is a 59 y.o. female  Presenting to ED w/ 3 weeks of worsening functional status. Previously able to walk. Pt lives in Group home. Family notes her mental status and behavior has continued to deteriorate. Pt w/ poor attention to detail since that time. Poor strength during this time. Developed stuttering. Per staff at group home, pt has been garbling her speech and becoming physically more weak for the past week. Today pt unable to bear weight and very weak.    Clinical Impression   Patient sleeping upon therapy arrival. Very difficult to wake up this AM. Was able to assist patient to edge of bed to eat breakfast. Patient was very slumped over which is probably a premorbid posture. Some attempts to communicate with therapists and was unsuccessful with garbled speech. Unsure about prior level of function as no family members or group home staff present. Patient did require a lot of assistance and I do not think the group home staff will be able to provide the assistance she needs. Recommend D/C to SNF to increase overall strength and ADL performance.     Follow Up Recommendations  SNF    Equipment Recommendations  None recommended by OT       Precautions / Restrictions Precautions Precautions: Fall Precaution Comments: MR, non-verbal Restrictions Weight Bearing Restrictions: No      Mobility                   Transfers Overall transfer level: Needs assistance   Transfers: Sit to/from Stand;Stand Pivot Transfers Sit to Stand: Max assist;+2 safety/equipment Stand pivot transfers: Max assist;+2 safety/equipment            Balance Overall balance assessment: Needs assistance Sitting-balance support: No upper extremity supported Sitting balance-Leahy Scale: Good     Standing balance support: No  upper extremity supported Standing balance-Leahy Scale: Fair                              ADL Overall ADL's : Needs assistance/impaired Eating/Feeding: Minimal assistance;Sitting                   Lower Body Dressing: Total assistance;Bed level   Toilet Transfer: Maximal assistance;Stand-pivot                    Pertinent Vitals/Pain Pain Assessment: Faces Faces Pain Scale: Hurts a little bit Pain Location: bilateral legs     Hand Dominance Right   Extremity/Trunk Assessment Upper Extremity Assessment Upper Extremity Assessment: Generalized weakness   Lower Extremity Assessment Lower Extremity Assessment: Defer to PT evaluation   Cervical / Trunk Assessment Cervical / Trunk Assessment: Kyphotic (flexion of c-spine, rounded shoulders)   Communication Communication Communication: Expressive difficulties   Cognition Arousal/Alertness: Lethargic Behavior During Therapy: Flat affect Overall Cognitive Status: No family/caregiver present to determine baseline cognitive functioning       Memory:  (unable to assess)                        Home Living Family/patient expects to be discharged to:: Group home  Prior Functioning/Environment Level of Independence: Needs assistance        Comments: Family and group home staff unable to confirm prior level of function. It is thought that patient required total Assist for BADL and was able to ambulate.     OT Diagnosis: Generalized weakness  2x a week   OT Problem List: Decreased strength;Decreased activity tolerance   OT Treatment/Interventions: Self-care/ADL training;Therapeutic exercise;Neuromuscular education;Therapeutic activities;Modalities;DME and/or AE instruction;Manual therapy;Patient/family education    OT Goals(Current goals can be found in the care plan section) Acute Rehab OT Goals Patient Stated Goal: none OT Goal  Formulation: With patient Time For Goal Achievement: 10/24/14 Potential to Achieve Goals: Fair ADL Goals Pt Will Perform Eating: with supervision;sitting Pt Will Perform Grooming: with supervision;sitting Pt Will Perform Upper Body Bathing: with min assist;sitting Pt Will Transfer to Toilet: with mod assist;stand pivot transfer;bedside commode Additional ADL Goal #1: Patient will increase BUE strength to 3+/5 to increase ability to participate in ADL tasks.   OT Frequency: Min 2X/week           Co-evaluation   Reason for Co-Treatment: Necessary to address cognition/behavior during functional activity;For patient/therapist safety PT goals addressed during session: Mobility/safety with mobility;Balance        End of Session Equipment Utilized During Treatment: Gait belt  Activity Tolerance: Patient limited by lethargy Patient left: in chair;with chair alarm set;with call bell/phone within reach   Time: 0827-0916 OT Time Calculation (min): 49 min Charges:  OT General Charges $OT Visit: 1 Procedure OT Evaluation $Initial OT Evaluation Tier I: 1 Procedure G-Codes:    Limmie Patricia, OTR/L,CBIS  [phone]  10/10/2014, 11:54 AM

## 2014-10-10 NOTE — Progress Notes (Signed)
Pt having seizure like activity. MD paged and in to evaluate pt. New orders given.

## 2014-10-10 NOTE — Consult Note (Signed)
Emery A. Merlene Laughter, MD     [email or web address]          Christine Pena is an 59 y.o. female.   ASSESSMENT/PLAN: 1. Acute confusional state/altered mental status. The etiology is most likely multifactorial including urinary tract infection. Multi-medication use/polypharmacy is also significant issue.  2. Baseline severe mental retardation.  3. Epilepsy-intractable.  4. Polypharmacy.  5. Gait impairment due to #1. Physical therapy.  I think we should simplify the patient's seizure medications. I would suggest that she be weaned off phenobarbital and increasing lamictal. Unfortunate, this will have to be done in a painstaking is no way to prevent phenobarbital withdrawal seizures. I have reduced the dose down to the 130 mg nightly. We did schedule be written out to be reduced every 2 months. The dose of Lamictal has been increased.  The patient is a 59 year old white female who presents with deconditioning and reduced functional status including increasing confusion, worsening behavior and gait impairment over the last week or so. She was treated in the emergency room a few days ago for UTI. It appears that she may not have been getting the full dose of her antibiotics. Her situation, she was returned back to emerge from for persistent symptoms. The patient was seen here last year because of status epilepticus. The patient also presented with acute confusional state due to elevated ammonia level. With discontinuation of Depakote may have caused the seizures at that time. The patient is unable to provide history because of severe underlying cognitive impairment.   GENERAL: This is a pleasant obese female she is in no acute distress.  HEENT: Supple. Atraumatic normocephalic.   ABDOMEN: soft  EXTREMITIES: No edema   BACK: Normal.  SKIN: Normal by inspection.    MENTAL STATUS: She is awake and alert. She has mostly incompressible speech but at times she does have  some comprehendible speech stating that her birthday is coming up soon. She does not follow commands.  CRANIAL NERVES: Pupils are equal, round and reactive to light; extra ocular movements are full, there is no significant nystagmus; visual fields are full; upper and lower facial muscles are normal in strength and symmetric, there is no flattening of the nasolabial folds.  MOTOR: She has at least antigravity strength in the upper and lower extremities. Tone and bulk are unremarkable.  COORDINATION: Left finger to nose is normal, right finger to nose is normal, No rest tremor; no intention tremor; no postural tremor; no bradykinesia.  REFLEXES: Deep tendon reflexes are symmetrical and normal to brisk.   SENSATION: Normal to painful stimuli.    Blood pressure 143/79, pulse 89, temperature 97.8 F (36.6 C), temperature source Oral, resp. rate 20, height '5\' 2"'  (1.575 m), weight 92 kg (202 lb 13.2 oz), SpO2 95 %.  Past Medical History  Diagnosis Date  . Hypertension   . Seizures   . Mental retardation     History reviewed. No pertinent past surgical history.  Family History  Problem Relation Age of Onset  . Heart disease Mother     Social History:  reports that she has never smoked. She has never used smokeless tobacco. She reports that she does not drink alcohol or use illicit drugs.  Allergies:  Allergies  Allergen Reactions  . Depakote [Valproic Acid]     altered mental status/ high ammonia  . Haldol [Haloperidol Lactate]     Unknown reaction.    Medications: Prior to Admission medications   Medication Sig Start Date End  Date Taking? Authorizing Provider  cephALEXin (KEFLEX) 500 MG capsule Take 1 capsule (500 mg total) by mouth 2 (two) times daily. 10/05/14  Yes Kristen N Ward, DO  chlorhexidine (PERIDEX) 0.12 % solution Use as directed 10 mLs in the mouth or throat 4 (four) times daily.   Yes Historical Provider, MD  hydrocortisone (ANUSOL-HC) 2.5 % rectal cream Place 1  application rectally 2 (two) times daily.   Yes Historical Provider, MD  lamoTRIgine (LAMICTAL) 25 MG tablet Take 1 tablet (25 mg total) by mouth 2 (two) times daily. 05/05/14  Yes Lezlie Octave Black, NP  levETIRAcetam (KEPPRA) 500 MG tablet Take 1 tablet (500 mg total) by mouth 2 (two) times daily. 05/05/14  Yes Lezlie Octave Black, NP  moexipril (UNIVASC) 7.5 MG tablet Take 7.5 mg by mouth daily.  12/30/13  Yes Historical Provider, MD  oxybutynin (DITROPAN) 5 MG tablet Take 5 mg by mouth daily.  12/30/13  Yes Historical Provider, MD  paliperidone (INVEGA) 6 MG 24 hr tablet Take 6 mg by mouth daily.   Yes Historical Provider, MD  PHENobarbital (LUMINAL) 64.8 MG tablet Take 129.6-194.4 mg by mouth See admin instructions. Takes 2 tablets at bedtime, alternating with 3 tablets the next night; repeat. 12/30/13  Yes Historical Provider, MD  QUEtiapine (SEROQUEL) 50 MG tablet Take 50 mg by mouth at bedtime.   Yes Historical Provider, MD  simvastatin (ZOCOR) 20 MG tablet Take 20 mg by mouth daily.  12/30/13  Yes Historical Provider, MD  vitamin E 200 UNIT capsule Take 200 Units by mouth daily.   Yes Historical Provider, MD  Vitamin D, Ergocalciferol, (DRISDOL) 50000 UNITS CAPS capsule Take 50,000 Units by mouth every 7 (seven) days. Takes on Saturdays.    Historical Provider, MD    Scheduled Meds: . chlorhexidine  10 mL Mouth/Throat QID  . heparin  5,000 Units Subcutaneous 3 times per day  . lamoTRIgine  25 mg Oral BID  . levETIRAcetam  500 mg Oral BID  . oxybutynin  5 mg Oral Daily  . PHENobarbital  129.6 mg Oral Q48H  . PHENobarbital  194.4 mg Oral Q48H  . QUEtiapine  50 mg Oral QHS  . simvastatin  20 mg Oral Daily   Continuous Infusions: . sodium chloride 75 mL/hr at 10/10/14 1118   PRN Meds:.acetaminophen, ondansetron **OR** ondansetron (ZOFRAN) IV     Results for orders placed or performed during the hospital encounter of 10/08/14 (from the past 48 hour(s))  Ammonia     Status: None   Collection Time:  10/08/14  6:56 PM  Result Value Ref Range   Ammonia 20 11 - 32 umol/L    Comment: Please note change in reference range.  Lactic acid, plasma     Status: None   Collection Time: 10/08/14  6:56 PM  Result Value Ref Range   Lactic Acid, Venous 1.5 0.5 - 2.2 mmol/L  Blood culture (routine x 2)     Status: None (Preliminary result)   Collection Time: 10/08/14  7:02 PM  Result Value Ref Range   Specimen Description RIGHT ANTECUBITAL    Special Requests BOTTLES DRAWN AEROBIC AND ANAEROBIC 5CC    Culture NO GROWTH 2 DAYS    Report Status PENDING   Blood culture (routine x 2)     Status: None (Preliminary result)   Collection Time: 10/08/14  7:07 PM  Result Value Ref Range   Specimen Description BLOOD RIGHT HAND    Special Requests BOTTLES DRAWN AEROBIC AND ANAEROBIC 6CC  Culture NO GROWTH 2 DAYS    Report Status PENDING   Comprehensive metabolic panel     Status: Abnormal   Collection Time: 10/09/14  5:47 AM  Result Value Ref Range   Sodium 143 135 - 145 mmol/L    Comment: Please note change in reference range.   Potassium 3.1 (L) 3.5 - 5.1 mmol/L    Comment: Please note change in reference range.   Chloride 108 96 - 112 mEq/L   CO2 28 19 - 32 mmol/L   Glucose, Bld 98 70 - 99 mg/dL   BUN 8 6 - 23 mg/dL   Creatinine, Ser 0.50 0.50 - 1.10 mg/dL   Calcium 9.1 8.4 - 10.5 mg/dL   Total Protein 6.0 6.0 - 8.3 g/dL   Albumin 3.4 (L) 3.5 - 5.2 g/dL   AST 14 0 - 37 U/L   ALT 12 0 - 35 U/L   Alkaline Phosphatase 52 39 - 117 U/L   Total Bilirubin 0.3 0.3 - 1.2 mg/dL   GFR calc non Af Amer >90 >90 mL/min   GFR calc Af Amer >90 >90 mL/min    Comment: (NOTE) The eGFR has been calculated using the CKD EPI equation. This calculation has not been validated in all clinical situations. eGFR's persistently <90 mL/min signify possible Chronic Kidney Disease.    Anion gap 7 5 - 15  CBC     Status: Abnormal   Collection Time: 10/09/14  5:47 AM  Result Value Ref Range   WBC 4.8 4.0 - 10.5 K/uL    RBC 3.65 (L) 3.87 - 5.11 MIL/uL   Hemoglobin 10.9 (L) 12.0 - 15.0 g/dL   HCT 31.7 (L) 36.0 - 46.0 %   MCV 86.8 78.0 - 100.0 fL   MCH 29.9 26.0 - 34.0 pg   MCHC 34.4 30.0 - 36.0 g/dL   RDW 12.4 11.5 - 15.5 %   Platelets 270 150 - 400 K/uL  TSH     Status: None   Collection Time: 10/09/14  5:57 PM  Result Value Ref Range   TSH 1.481 0.350 - 4.500 uIU/mL  Vitamin B12     Status: None   Collection Time: 10/09/14  5:57 PM  Result Value Ref Range   Vitamin B-12 273 211 - 911 pg/mL    Comment: Performed at [place] metabolic panel     Status: Abnormal   Collection Time: 10/10/14 12:48 AM  Result Value Ref Range   Sodium 141 135 - 145 mmol/L    Comment: Please note change in reference range.   Potassium 3.4 (L) 3.5 - 5.1 mmol/L    Comment: Please note change in reference range.   Chloride 106 96 - 112 mEq/L   CO2 27 19 - 32 mmol/L   Glucose, Bld 106 (H) 70 - 99 mg/dL   BUN 10 6 - 23 mg/dL   Creatinine, Ser 0.63 0.50 - 1.10 mg/dL   Calcium 8.8 8.4 - 10.5 mg/dL   GFR calc non Af Amer >90 >90 mL/min   GFR calc Af Amer >90 >90 mL/min    Comment: (NOTE) The eGFR has been calculated using the CKD EPI equation. This calculation has not been validated in all clinical situations. eGFR's persistently <90 mL/min signify possible Chronic Kidney Disease.    Anion gap 8 5 - 15  Magnesium     Status: None   Collection Time: 10/10/14 12:50 AM  Result Value Ref Range   Magnesium 1.9 1.5 -  2.5 mg/dL    Phenobarbital 27  Studies/Results:  HEAD CT Stable marked cerebellar atrophy. No acute abnormality.    Cabela Pacifico A. Merlene Laughter, M.D.  Diplomate, Tax adviser of Psychiatry and Neurology ( Neurology). 10/10/2014, 6:37 PM

## 2014-10-10 NOTE — Progress Notes (Signed)
EEG Completed; Results Pending  

## 2014-10-10 NOTE — Care Management Note (Addendum)
    Page 1 of 1   10/11/2014     1:08:50 PM CARE MANAGEMENT NOTE 10/11/2014  Patient:  Christine Pena,[name]   Account Number:  [id number]  Date Initiated:  10/10/2014  Documentation initiated by:  Kathyrn SheriffHILDRESS,JESSICA  Subjective/Objective Assessment:   Pt admitted with AMS. Pt is from [facility]. Per Pt's recommendations pt will discharge to SNF. CSW aware and will arrange for discharge to SNF. No CM needs identified.     Action/Plan:   Anticipated DC Date:  10/11/2014   Anticipated DC Plan:  SKILLED NURSING FACILITY  In-house referral  Clinical Social Worker      DC Planning Services  CM consult      Choice offered to / List presented to:             Status of service:  Completed, signed off Medicare Important Message given?  YES (If response is "NO", the following Medicare IM given date fields will be blank) Date Medicare IM given:  10/11/2014 Medicare IM given by:  Kathyrn SheriffHILDRESS,JESSICA Date Additional Medicare IM given:   Additional Medicare IM given by:    Discharge Disposition:  SKILLED NURSING FACILITY  Per UR Regulation:    If discussed at Long Length of Stay Meetings, dates discussed:    Comments:  10/10/2014 1220 Kathyrn SheriffJessica Childress, RN, MSN, [facility]

## 2014-10-10 NOTE — Progress Notes (Signed)
Notified NP that patient was having seizure like activity in her lower legs. Patients VS were within normal limits. Patient was alert and not having any difficulty breathing. Patient received her scheduled anticonvulsant medications @0936 . Suction is set up at the bedside. Will continue to monitor patient at this time.

## 2014-10-10 NOTE — Procedures (Signed)
  [facility] Takelia Urieta A. Gerilyn Pilgrimoonquah, MD     [email or web address]           HISTORY: This is a 59 year old female who presents with confusion and altered mental status. There is a baseline history of seizures.  MEDICATIONS: Scheduled Meds: . chlorhexidine  10 mL Mouth/Throat QID  . heparin  5,000 Units Subcutaneous 3 times per day  . lamoTRIgine  25 mg Oral BID  . levETIRAcetam  500 mg Oral BID  . oxybutynin  5 mg Oral Daily  . PHENobarbital  129.6 mg Oral Q48H  . PHENobarbital  194.4 mg Oral Q48H  . QUEtiapine  50 mg Oral QHS  . simvastatin  20 mg Oral Daily   Continuous Infusions: . sodium chloride 75 mL/hr at 10/10/14 1118   PRN Meds:.acetaminophen, ondansetron **OR** ondansetron (ZOFRAN) IV  Prior to Admission medications   Medication Sig Start Date End Date Taking? Authorizing Provider  cephALEXin (KEFLEX) 500 MG capsule Take 1 capsule (500 mg total) by mouth 2 (two) times daily. 10/05/14  Yes Kristen N Ward, DO  chlorhexidine (PERIDEX) 0.12 % solution Use as directed 10 mLs in the mouth or throat 4 (four) times daily.   Yes Historical Provider, MD  hydrocortisone (ANUSOL-HC) 2.5 % rectal cream Place 1 application rectally 2 (two) times daily.   Yes Historical Provider, MD  lamoTRIgine (LAMICTAL) 25 MG tablet Take 1 tablet (25 mg total) by mouth 2 (two) times daily. 05/05/14  Yes Lesle ChrisKaren M Black, NP  levETIRAcetam (KEPPRA) 500 MG tablet Take 1 tablet (500 mg total) by mouth 2 (two) times daily. 05/05/14  Yes Lesle ChrisKaren M Black, NP  moexipril (UNIVASC) 7.5 MG tablet Take 7.5 mg by mouth daily.  12/30/13  Yes Historical Provider, MD  oxybutynin (DITROPAN) 5 MG tablet Take 5 mg by mouth daily.  12/30/13  Yes Historical Provider, MD  paliperidone (INVEGA) 6 MG 24 hr tablet Take 6 mg by mouth daily.   Yes Historical Provider, MD  PHENobarbital (LUMINAL) 64.8 MG tablet Take 129.6-194.4 mg by mouth See admin instructions. Takes 2 tablets at bedtime, alternating with 3 tablets the next  night; repeat. 12/30/13  Yes Historical Provider, MD  QUEtiapine (SEROQUEL) 50 MG tablet Take 50 mg by mouth at bedtime.   Yes Historical Provider, MD  simvastatin (ZOCOR) 20 MG tablet Take 20 mg by mouth daily.  12/30/13  Yes Historical Provider, MD  vitamin E 200 UNIT capsule Take 200 Units by mouth daily.   Yes Historical Provider, MD  Vitamin D, Ergocalciferol, (DRISDOL) 50000 UNITS CAPS capsule Take 50,000 Units by mouth every 7 (seven) days. Takes on Saturdays.    Historical Provider, MD      ANALYSIS: A 16 channel recording using standard 10 20 measurements is conducted for 39 minutes. There is a background activity that is as high as 9-1/2 Hz bilaterally. There is beta activity observed in frontal areas. Awake and drowsy activities are observed. Photic stimulation and hyperventilation are not carried out. The patient does have shaking but there are no electrographic correlates. There is no focal or lateralized slowing. There is no epileptiform activity observed.   IMPRESSION: 1. This a normal recording awake and drowsy states.      Desiray Orchard A. Gerilyn Pilgrimoonquah, M.D.  Diplomate, Biomedical engineerAmerican Board of Psychiatry and Neurology ( Neurology).

## 2014-10-10 NOTE — Care Management Utilization Note (Signed)
UR completed 

## 2014-10-10 NOTE — Clinical Social Work Placement (Signed)
Clinical Social Work Department CLINICAL SOCIAL WORK PLACEMENT NOTE 10/10/2014  Patient:  Christine Pena,[name]  Account Number:  [id number] Admit date:  10/08/2014  Clinical Social Worker:  Tretha SciaraHEATHER Calix Heinbaugh, LCSW  Date/time:  10/10/2014 01:47 PM  Clinical Social Work is seeking post-discharge placement for this patient at the following level of care:   SKILLED NURSING   (*CSW will update this form in Epic as items are completed)   10/10/2014  Patient/family provided with [facility] Department of Clinical Social Work's list of facilities offering this level of care within the geographic area requested by the patient (or if unable, by the patient's family).  10/10/2014  Patient/family informed of their freedom to choose among providers that offer the needed level of care, that participate in Medicare, Medicaid or managed care program needed by the patient, have an available bed and are willing to accept the patient.  10/10/2014  Patient/family informed of [facility] ownership interest in [facility], as well as of the fact that they are under no obligation to receive care at this facility.  PASARR submitted to EDS on 10/10/2014 PASARR number received on   FL2 transmitted to all facilities in geographic area requested by pt/family on   FL2 transmitted to all facilities within larger geographic area on   Patient informed that his/her managed care company has contracts with or will negotiate with  certain facilities, including the following:     Patient/family informed of bed offers received:   Patient chooses bed at  Physician recommends and patient chooses bed at    Patient to be transferred to  on   Patient to be transferred to facility by  Patient and family notified of transfer on  Name of family member notified:    The following physician request were entered in Epic:   Additional Comments:  Tretha SciaraHeather Shann Merrick, LCSW [phone]

## 2014-10-10 NOTE — Clinical Social Work Psychosocial (Signed)
Clinical Social Work Department BRIEF PSYCHOSOCIAL ASSESSMENT 10/10/2014  Patient:  Christine Pena,Pattie L     Account Number:  [id number]     Admit date:  10/08/2014  Clinical Social Worker:  Trilby LeaverSETTLE,Garrie Woodin, LCSW  Date/Time:  10/10/2014 01:34 PM  Referred by:  CSW  Date Referred:  10/10/2014 Referred for  SNF Placement   Other Referral:   Interview type:  Family Other interview type:   Janese BanksGladys Wahler, mother  Corinne Portsddie Shuck, brother  Kara Meadmma, [facility]    PSYCHOSOCIAL DATA Living Status:  FACILITY Admitted from facility:  Other Level of care:  Group Home Primary support name:  Janese BanksGladys Seawright Primary support relationship to patient:  PARENT Degree of support available:   Mother is supportive as well as her brothers.    CURRENT CONCERNS Current Concerns  Post-Acute Placement   Other Concerns:    SOCIAL WORK ASSESSMENT / PLAN Patient was not able to participte in assessment due to IDD.  CSW spoke with patient's mother, Janese BanksGladys Cornforth. Ms. Levander CampionDraughn indicated that patient has been a resident at [facility] for about a year. She indicated that patient requires assistance with all ADL's.  Ms. Levander CampionDraughn indicated prior to patient becoming a resident at [facility] she provided care to patient.  She indicated that she became ill 13 months ago and had to [facility] and this is when patient went to the group home. CSW discussed the SNF recommendation. Patient's mother indicated that she would like for [facility] to [facility].  She gave CSW permission to send clinicals to other SNF's within the county.  Ms. Levander CampionDraughn indicated that she desired for patient to return to [facility] upon discharge from SNF.  CSW spoke with patient's brother Sanjuan Dameddie Ray Betzler.  He indicated that his mother was patient's legal guardian.  He confirmed his mother's statements.  CSW spoke with [facility] MeadEmma at [facility].  Kara Meadmma confirmed Ms. Porrata's statements. She  indicated that patient could return to the facility upon completion of her rehab.  CSW completed PASRR and send clinicals to SNF's in the county.   Assessment/plan status:   Other assessment/ plan:   Information/referral to community resources:    PATIENT'S/FAMILY'S RESPONSE TO PLAN OF CARE: Family agreeable for patient to go to SNF then return to [facility].    Tretha SciaraHeather Krishawna Stiefel, KentuckyLCSW [phone]

## 2014-10-11 DIAGNOSIS — E784 Other hyperlipidemia: Secondary | ICD-10-CM | POA: Diagnosis not present

## 2014-10-11 DIAGNOSIS — F10120 Alcohol abuse with intoxication, uncomplicated: Secondary | ICD-10-CM | POA: Diagnosis not present

## 2014-10-11 DIAGNOSIS — R4182 Altered mental status, unspecified: Secondary | ICD-10-CM | POA: Diagnosis not present

## 2014-10-11 DIAGNOSIS — N390 Urinary tract infection, site not specified: Secondary | ICD-10-CM | POA: Diagnosis not present

## 2014-10-11 DIAGNOSIS — E876 Hypokalemia: Secondary | ICD-10-CM | POA: Diagnosis not present

## 2014-10-11 DIAGNOSIS — Z7401 Bed confinement status: Secondary | ICD-10-CM | POA: Diagnosis not present

## 2014-10-11 DIAGNOSIS — G4089 Other seizures: Secondary | ICD-10-CM | POA: Diagnosis not present

## 2014-10-11 DIAGNOSIS — F79 Unspecified intellectual disabilities: Secondary | ICD-10-CM | POA: Diagnosis not present

## 2014-10-11 DIAGNOSIS — F29 Unspecified psychosis not due to a substance or known physiological condition: Secondary | ICD-10-CM | POA: Diagnosis not present

## 2014-10-11 DIAGNOSIS — I10 Essential (primary) hypertension: Secondary | ICD-10-CM | POA: Diagnosis not present

## 2014-10-11 LAB — CBC
HCT: 35.1 % — ABNORMAL LOW (ref 36.0–46.0)
Hemoglobin: 12.1 g/dL (ref 12.0–15.0)
MCH: 29.8 pg (ref 26.0–34.0)
MCHC: 34.5 g/dL (ref 30.0–36.0)
MCV: 86.5 fL (ref 78.0–100.0)
Platelets: 287 10*3/uL (ref 150–400)
RBC: 4.06 MIL/uL (ref 3.87–5.11)
RDW: 12.3 % (ref 11.5–15.5)
WBC: 8.9 10*3/uL (ref 4.0–10.5)

## 2014-10-11 LAB — BASIC METABOLIC PANEL
Anion gap: 7 (ref 5–15)
BUN: 8 mg/dL (ref 6–23)
CO2: 25 mmol/L (ref 19–32)
CREATININE: 0.43 mg/dL — AB (ref 0.50–1.10)
Calcium: 8.8 mg/dL (ref 8.4–10.5)
Chloride: 107 mEq/L (ref 96–112)
GFR calc Af Amer: >90 mL/min (ref >90)
GFR calc non Af Amer: >90 mL/min (ref >90)
Glucose, Bld: 86 mg/dL (ref 70–99)
POTASSIUM: 3.5 mmol/L (ref 3.5–5.1)
Sodium: 139 mmol/L (ref 135–145)

## 2014-10-11 MED ORDER — PHENOBARBITAL 64.8 MG PO TABS: 129.6000 mg | ORAL_TABLET | Freq: Every day | ORAL | Status: DC

## 2014-10-11 MED ORDER — QUETIAPINE FUMARATE 50 MG PO TABS: 50.0000 mg | ORAL_TABLET | Freq: Every day | ORAL | Status: DC

## 2014-10-11 MED ORDER — LAMOTRIGINE 25 MG PO TABS
50.0000 mg | ORAL_TABLET | Freq: Two times a day (BID) | ORAL | Status: DC
Start: 2014-10-11 — End: 2014-11-26

## 2014-10-11 NOTE — Clinical Social Work Placement (Signed)
Clinical Social Work Department CLINICAL SOCIAL WORK PLACEMENT NOTE 10/11/2014  Patient:  Christine Pena,Christine Pena  Account Number:  [id number] Admit date:  10/08/2014  Clinical Social Worker:  Tretha SciaraHEATHER Korrine Sicard, LCSW  Date/time:  10/10/2014 01:47 PM  Clinical Social Work is seeking post-discharge placement for this patient at the following level of care:   SKILLED NURSING   (*CSW will update this form in Epic as items are completed)   10/10/2014  Patient/family provided with [facility] Department of Clinical Social Work's list of facilities offering this level of care within the geographic area requested by the patient (or if unable, by the patient's family).  10/10/2014  Patient/family informed of their freedom to choose among providers that offer the needed level of care, that participate in Medicare, Medicaid or managed care program needed by the patient, have an available bed and are willing to accept the patient.  10/10/2014  Patient/family informed of [facility] ownership interest in [facility], as well as of the fact that they are under no obligation to receive care at this facility.  PASARR submitted to EDS on 10/10/2014 PASARR number received on 10/11/2014  FL2 transmitted to all facilities in geographic area requested by pt/family on  10/10/2014 FL2 transmitted to all facilities within larger geographic area on   Patient informed that his/her managed care company has contracts with or will negotiate with  certain facilities, including the following:     Patient/family informed of bed offers received:  10/11/2014 [facility] Physician recommends and patient chooses bed at    Patient [facility] be [facility] on  10/11/2014 Patient to be transferred to facility by [facility] Patient and family notified of transfer on 10/11/2014 Name of family member notified:    The following physician request were entered in  Epic:   Additional Comments:   Tretha SciaraHeather Mohammed Mcandrew, LCSW [phone]

## 2014-10-11 NOTE — Clinical Social Work Note (Addendum)
CSW spoke with patient's mother/legal guardian, Christine Pena, and provided bed offers. Christine Pena accepted [facility]. CSW notified Christine Pena that patient would be discharged today.   CSW [facility] with [facility]. CSW advised that patient's guardian had accepted the bed offer. CSW advised that patient's guardian was a [facility].  Christine Pena indicated that she would go to [facility] to meet with Christine Pena and have her sign patient's paperwork.  CSW advised that patient woiuld be discharged today.CSW advised that patients PASARR number expires on 11/10/14.    CSW [facility] of patient's discharge and [facility].    CSW facilitated discharge.  CSW signing off.     Christine Pena, [place] [phone]

## 2014-10-11 NOTE — Progress Notes (Signed)
Patient transferred off floor via ems.

## 2014-10-11 NOTE — Progress Notes (Signed)
Report called to Amy at [facility] [place].

## 2014-10-11 NOTE — Discharge Summary (Signed)
Physician Discharge Summary  Christine Pena:[id number]:[id number] DOB: 10-09-1955 DOA: 10/08/2014  PCP: [name], [name], MD  Admit date: 10/08/2014 Discharge date: 10/11/2014  Time spent: 40  minutes  Recommendations for Outpatient Follow-up:  1. Follow up with Dr Gerilyn Pilgrimdoonquah 1 month for evaluation of seizure medication and recommendations for further weaning [facility]  Discharge Diagnoses:  Principal Problem:   Altered mental status Active Problems:   Mental retardation   Seizure   Essential hypertension   HLD (hyperlipidemia)   Hypokalemia   Discharge Condition: stable  Diet recommendation: regular with assist  [facility]Filed Weights   10/08/14 1335 10/08/14 1952  Weight: 112.492 kg (248 lb) 92 kg (202 lb 13.2 oz)    History of present illness:  Christine Pena is a 59 y.o. female  Presentined to ED on 10/08/14 w/ 3 weeks of worsening functional status. Previously able to walk. Pt lives in [facility] home ([facility]). Family noted her mental status and behavior had continued to deteriorate. Pt w/ poor attention to detail since that time. Poor strength during this time. Developed stuttering. PT dx on 10/05/13 at [facility] for UTI and treated with Keflex.    Per staff at group home, pt had been garbling her speech and becoming physically more weak. On day of presentation pt unable to bear weight and very weak. No VS instability and tolerating PO as nml. Pt had worsened since ED visit on 10/05/14.   Added invega in December   Small Seizures x4 on 09/22/14  Hospital Course:  Weakness/Lethargy Likely related to polypharmacy and some concern for continuous seizure. EEG done and was normal. Evaluated by neurology who opine medications in setting of recent UTI.  TSH/B12 within the limits of normal. Recommended simplifying seizure meds by slowly weaning off phenobarbital. Recommended next reduction be done 2 months. Lamictal increased. Follow up with [name] 1 month   Seizure  Disorder -As above, EEG normal.    Hyperlipidemia - zocor  HTN -Well controlled  Procedures: EEGThis a normal recording awake and drowsy states.   Consultations:  Dr Gerilyn Pilgrim[place]quah  Discharge Exam: Ceasar MonsFiled Vitals:   10/11/14 0516  BP: 147/68  Pulse: 67  Temp: 99.1 F (37.3 C)  Resp: 20    General: well nourished appears comfortable Cardiovascular: RRR No MGR No LE edema Respiratory: normal effort BS clear bilaterally   Discharge Instructions    Current Discharge Medication List    CONTINUE these medications which have CHANGED   Details  lamoTRIgine (LAMICTAL) 25 MG tablet Take 2 tablets (50 mg total) by mouth 2 (two) times daily. Qty: 30 tablet, Refills: 0    PHENobarbital (LUMINAL) 64.8 MG tablet Take 2 tablets (129.6 mg total) by mouth at bedtime. Qty: 30 tablet, Refills: 0      CONTINUE these medications which have NOT CHANGED   Details  chlorhexidine (PERIDEX) 0.12 % solution Use as directed 10 mLs in the mouth or throat 4 (four) times daily.    hydrocortisone (ANUSOL-HC) 2.5 % rectal cream Place 1 application rectally 2 (two) times daily.    levETIRAcetam (KEPPRA) 500 MG tablet Take 1 tablet (500 mg total) by mouth 2 (two) times daily. Qty: 60 tablet, Refills: 0    moexipril (UNIVASC) 7.5 MG tablet Take 7.5 mg by mouth daily.     oxybutynin (DITROPAN) 5 MG tablet Take 5 mg by mouth daily.     QUEtiapine (SEROQUEL) 50 MG tablet Take 50 mg by mouth at bedtime.    simvastatin (ZOCOR)  20 MG tablet Take 20 mg by mouth daily.     vitamin E 200 UNIT capsule Take 200 Units by mouth daily.    Vitamin D, Ergocalciferol, (DRISDOL) 50000 UNITS CAPS capsule Take 50,000 Units by mouth every 7 (seven) days. Takes on Saturdays.      STOP taking these medications     cephALEXin (KEFLEX) 500 MG capsule      paliperidone (INVEGA) 6 MG 24 hr tablet        Allergies  Allergen Reactions  . Depakote [Valproic Acid]     altered mental status/ high ammonia  .  Haldol [Haloperidol Lactate]     Unknown reaction.   Follow-up Information    Follow up with [facility], KOFI, MD In 1 month.   Specialty:  Neurology   Why:  evaluate seizure disorder and medication adjustment   Contact information:   [address] [place] [facility] [address] [phone]        The results of significant diagnostics from this hospitalization (including imaging, microbiology, ancillary and laboratory) are listed below for reference.    Significant Diagnostic Studies: Dg Chest 1 View  10/08/2014   CLINICAL DATA:  Cough, weakness, altered mental status, hypertension, history seizures  EXAM: CHEST - 1 VIEW  COMPARISON:  05/03/2014  FINDINGS: Borderline enlargement of cardiac silhouette.  Mediastinal contours and pulmonary vascularity normal.  Lungs clear.  No pleural effusion or pneumothorax.  Bones demineralized.  IMPRESSION: No acute abnormalities.   Electronically Signed   By: Ulyses Southward M.D.   On: 10/08/2014 15:31   Ct Head Wo Contrast  10/08/2014   CLINICAL DATA:  Altered mental status. Recent dx of UTI. Pt lives in group home care. Caregiver states pt has had garbled speech intermittently x 3 days. Hx of HTN, seizures,mental retardation. Last seizure 09/22/14  EXAM: CT HEAD WITHOUT CONTRAST  TECHNIQUE: Contiguous axial images were obtained from the base of the skull through the vertex without intravenous contrast.  COMPARISON:  10/05/2014.  FINDINGS: Marked diffuse cerebellar atrophy is unchanged. The ventricles remain normal in size and position. No intracranial hemorrhage, mass lesion or CT evidence of acute infarction. Unremarkable bones and included paranasal sinuses.  IMPRESSION: Stable marked cerebellar atrophy.  No acute abnormality.   Electronically Signed   By: Gordan Payment M.D.   On: 10/08/2014 16:06   Ct Head Wo Contrast  10/05/2014   CLINICAL DATA:  Right-sided weakness.  No known injury.  EXAM: CT HEAD WITHOUT CONTRAST  TECHNIQUE: Contiguous axial images were  obtained from the base of the skull through the vertex without intravenous contrast.  COMPARISON:  05/03/2014  FINDINGS: Diffuse cerebellar atrophy. This is stable. No acute intracranial abnormality. Specifically, no hemorrhage, hydrocephalus, mass lesion, acute infarction, or significant intracranial injury. No acute calvarial abnormality. Visualized paranasal sinuses and mastoids clear. Orbital soft tissues unremarkable.  IMPRESSION: No acute intracranial abnormality.  Cerebellar atrophy.   Electronically Signed   By: Charlett Nose M.D.   On: 10/05/2014 15:05    Microbiology: Recent Results (from the past 240 hour(s))  Urine culture     Status: None   Collection Time: 10/05/14  1:45 PM  Result Value Ref Range Status   Specimen Description URINE, CLEAN CATCH  Final   Special Requests NONE  Final   Colony Count   Final    30,000 COLONIES/ML Performed at [facility]    Culture   Final    Multiple bacterial morphotypes present, none predominant. Suggest appropriate recollection if clinically  indicated. Performed at [facility]    Report Status 10/06/2014 FINAL  Final  Blood culture (routine x 2)     Status: None (Preliminary result)   Collection Time: 10/08/14  7:02 PM  Result Value Ref Range Status   Specimen Description RIGHT ANTECUBITAL  Final   Special Requests BOTTLES DRAWN AEROBIC AND ANAEROBIC 5CC  Final   Culture NO GROWTH 3 DAYS  Final   Report Status PENDING  Incomplete  Blood culture (routine x 2)     Status: None (Preliminary result)   Collection Time: 10/08/14  7:07 PM  Result Value Ref Range Status   Specimen Description BLOOD RIGHT HAND  Final   Special Requests BOTTLES DRAWN AEROBIC AND ANAEROBIC 6CC  Final   Culture NO GROWTH 3 DAYS  Final   Report Status PENDING  Incomplete     Labs: Basic Metabolic Panel:  Recent Labs Lab 10/05/14 1344 10/08/14 1343 10/09/14 0547 10/10/14 0048 10/10/14 0050 10/11/14 0541  NA 135 141 143 141  --  139  K  3.6 3.5 3.1* 3.4*  --  3.5  CL 103 107 108 106  --  107  CO2 --  25  GLUCOSE 101* 121* 98 106*  --  86  BUN --  8  CREATININE 0.96 0.57 0.50 0.63  --  0.43*  CALCIUM 9.2 9.5 9.1 8.8  --  8.8  MG  --   --   --   --  1.9  --    Liver Function Tests:  Recent Labs Lab 10/09/14 0547  AST 14  ALT 12  ALKPHOS 52  BILITOT 0.3  PROT 6.0  ALBUMIN 3.4*   No results for input(s): LIPASE, AMYLASE in the last 168 hours.  Recent Labs Lab 10/08/14 1856  AMMONIA 20   CBC:  Recent Labs Lab 10/05/14 1344 10/08/14 1343 10/09/14 0547 10/11/14 0541  WBC 7.1 7.5 4.8 8.9  NEUTROABS 4.4 5.8  --   --   HGB 12.5 12.0 10.9* 12.1  HCT 37.1 35.7* 31.7* 35.1*  MCV 86.9 86.7 86.8 86.5  PLT 326 330 270 287   Cardiac Enzymes: No results for input(s): CKTOTAL, CKMB, CKMBINDEX, TROPONINI in the last 168 hours. BNP: BNP (last 3 results) No results for input(s): PROBNP in the last 8760 hours. CBG: No results for input(s): GLUCAP in the last 168 hours.     SignedGwenyth Bender  [facility] 10/11/2014, 11:57 AM

## 2014-10-12 DIAGNOSIS — G4089 Other seizures: Secondary | ICD-10-CM | POA: Diagnosis not present

## 2014-10-12 DIAGNOSIS — E784 Other hyperlipidemia: Secondary | ICD-10-CM | POA: Diagnosis not present

## 2014-10-12 DIAGNOSIS — N390 Urinary tract infection, site not specified: Secondary | ICD-10-CM | POA: Diagnosis not present

## 2014-10-12 DIAGNOSIS — R4182 Altered mental status, unspecified: Secondary | ICD-10-CM | POA: Diagnosis not present

## 2014-10-13 LAB — CULTURE, BLOOD (ROUTINE X 2)
CULTURE: NO GROWTH
Culture: NO GROWTH

## 2014-11-24 ENCOUNTER — Emergency Department (HOSPITAL_COMMUNITY): Payer: Medicare Other

## 2014-11-24 ENCOUNTER — Observation Stay (HOSPITAL_COMMUNITY): Payer: Medicare Other

## 2014-11-24 ENCOUNTER — Observation Stay (HOSPITAL_COMMUNITY)
Admission: EM | Admit: 2014-11-24 | Discharge: 2014-11-26 | Disposition: A | Discharge status: Home / Self Care | Payer: Medicare Other | Attending: Internal Medicine | Admitting: Internal Medicine

## 2014-11-24 ENCOUNTER — Encounter (HOSPITAL_COMMUNITY): Payer: Self-pay

## 2014-11-24 DIAGNOSIS — R569 Unspecified convulsions: Secondary | ICD-10-CM | POA: Diagnosis not present

## 2014-11-24 DIAGNOSIS — Z79899 Other long term (current) drug therapy: Secondary | ICD-10-CM | POA: Insufficient documentation

## 2014-11-24 DIAGNOSIS — R4182 Altered mental status, unspecified: Secondary | ICD-10-CM | POA: Diagnosis not present

## 2014-11-24 DIAGNOSIS — Z7952 Long term (current) use of systemic steroids: Secondary | ICD-10-CM | POA: Insufficient documentation

## 2014-11-24 DIAGNOSIS — R5383 Other fatigue: Secondary | ICD-10-CM | POA: Diagnosis not present

## 2014-11-24 DIAGNOSIS — I10 Essential (primary) hypertension: Secondary | ICD-10-CM | POA: Diagnosis not present

## 2014-11-24 DIAGNOSIS — F79 Unspecified intellectual disabilities: Secondary | ICD-10-CM

## 2014-11-24 LAB — CBC WITH DIFFERENTIAL/PLATELET
Basophils Absolute: 0 10*3/uL (ref 0.0–0.1)
Basophils Relative: 0 % (ref 0–1)
Eosinophils Absolute: 0 10*3/uL (ref 0.0–0.7)
Eosinophils Relative: 0 % (ref 0–5)
HEMATOCRIT: 37.3 % (ref 36.0–46.0)
Hemoglobin: 12.6 g/dL (ref 12.0–15.0)
LYMPHS ABS: 2.2 10*3/uL (ref 0.7–4.0)
LYMPHS PCT: 30 % (ref 12–46)
MCH: 29.4 pg (ref 26.0–34.0)
MCHC: 33.8 g/dL (ref 30.0–36.0)
MCV: 86.9 fL (ref 78.0–100.0)
MONO ABS: 0.5 10*3/uL (ref 0.1–1.0)
MONOS PCT: 7 % (ref 3–12)
NEUTROS ABS: 4.6 10*3/uL (ref 1.7–7.7)
NEUTROS PCT: 63 % (ref 43–77)
Platelets: 341 10*3/uL (ref 150–400)
RBC: 4.29 MIL/uL (ref 3.87–5.11)
RDW: 12.4 % (ref 11.5–15.5)
WBC: 7.4 10*3/uL (ref 4.0–10.5)

## 2014-11-24 LAB — URINALYSIS, ROUTINE W REFLEX MICROSCOPIC
BILIRUBIN URINE: NEGATIVE
Glucose, UA: NEGATIVE mg/dL
KETONES UR: NEGATIVE mg/dL
Leukocytes, UA: NEGATIVE
NITRITE: NEGATIVE
PH: 5.5 (ref 5.0–8.0)
Protein, ur: NEGATIVE mg/dL
Specific Gravity, Urine: <1.005 — ABNORMAL LOW (ref 1.005–1.030)
Urobilinogen, UA: 0.2 mg/dL (ref 0.0–1.0)

## 2014-11-24 LAB — BASIC METABOLIC PANEL
Anion gap: 9 (ref 5–15)
BUN: 8 mg/dL (ref 6–23)
CALCIUM: 9.6 mg/dL (ref 8.4–10.5)
CO2: 27 mmol/L (ref 19–32)
Chloride: 104 mmol/L (ref 96–112)
Creatinine, Ser: 0.59 mg/dL (ref 0.50–1.10)
GFR calc Af Amer: >90 mL/min (ref >90)
Glucose, Bld: 106 mg/dL — ABNORMAL HIGH (ref 70–99)
Potassium: 3.4 mmol/L — ABNORMAL LOW (ref 3.5–5.1)
Sodium: 140 mmol/L (ref 135–145)

## 2014-11-24 LAB — I-STAT CG4 LACTIC ACID, ED: LACTIC ACID, VENOUS: 0.51 mmol/L (ref 0.5–2.0)

## 2014-11-24 LAB — HEPATIC FUNCTION PANEL
ALK PHOS: 56 U/L (ref 39–117)
ALT: 12 U/L (ref 0–35)
AST: 17 U/L (ref 0–37)
Albumin: 4 g/dL (ref 3.5–5.2)
BILIRUBIN DIRECT: 0.1 mg/dL (ref 0.0–0.5)
BILIRUBIN INDIRECT: 0.2 mg/dL — AB (ref 0.3–0.9)
Total Bilirubin: 0.3 mg/dL (ref 0.3–1.2)
Total Protein: 7 g/dL (ref 6.0–8.3)

## 2014-11-24 LAB — URINE MICROSCOPIC-ADD ON

## 2014-11-24 LAB — PHENOBARBITAL LEVEL: Phenobarbital: 24.8 ug/mL (ref 15.0–40.0)

## 2014-11-24 LAB — LIPASE, BLOOD: Lipase: 18 U/L (ref 11–59)

## 2014-11-24 MED ORDER — VITAMIN E 45 MG (100 UNIT) PO CAPS
200.0000 [IU] | ORAL_CAPSULE | Freq: Every day | ORAL | Status: DC
  Administered 2014-11-25 – 2014-11-26 (×2): 200 [IU] via ORAL
  Filled 2014-11-24 (×4): qty 2

## 2014-11-24 MED ORDER — SODIUM CHLORIDE 0.9 % IV BOLUS (SEPSIS)
500.0000 mL | Freq: Once | INTRAVENOUS | Status: AC
  Administered 2014-11-24: 500 mL via INTRAVENOUS

## 2014-11-24 MED ORDER — PALIPERIDONE ER 6 MG PO TB24
6.0000 mg | ORAL_TABLET | Freq: Every day | ORAL | Status: DC
Start: 2014-11-24 — End: 2014-11-24

## 2014-11-24 MED ORDER — PHENOBARBITAL 97.2 MG PO TABS
129.6000 mg | ORAL_TABLET | Freq: Every day | ORAL | Status: DC
  Administered 2014-11-24 – 2014-11-25 (×2): 129.6 mg via ORAL
  Filled 2014-11-24 (×4): qty 1

## 2014-11-24 MED ORDER — VITAMIN D (ERGOCALCIFEROL) 1.25 MG (50000 UNIT) PO CAPS
50000.0000 [IU] | ORAL_CAPSULE | ORAL | Status: DC
  Administered 2014-11-25: 50000 [IU] via ORAL
  Filled 2014-11-24: qty 1

## 2014-11-24 MED ORDER — HYDROCORTISONE 2.5 % RE CREA
1.0000 "application " | TOPICAL_CREAM | Freq: Two times a day (BID) | RECTAL | Status: DC
  Administered 2014-11-24 – 2014-11-26 (×4): 1 via RECTAL
  Filled 2014-11-24: qty 28.35

## 2014-11-24 MED ORDER — LAMOTRIGINE 25 MG PO TABS
ORAL_TABLET | ORAL | Status: AC
  Filled 2014-11-24: qty 1

## 2014-11-24 MED ORDER — TRANDOLAPRIL 2 MG PO TABS
1.0000 mg | ORAL_TABLET | Freq: Every day | ORAL | Status: DC
  Administered 2014-11-25 – 2014-11-26 (×2): 1 mg via ORAL
  Filled 2014-11-24: qty 1
  Filled 2014-11-24 (×2): qty 0.5
  Filled 2014-11-24 (×2): qty 1
  Filled 2014-11-24: qty 0.5

## 2014-11-24 MED ORDER — OXYBUTYNIN CHLORIDE 5 MG PO TABS
5.0000 mg | ORAL_TABLET | Freq: Every day | ORAL | Status: DC
  Administered 2014-11-25 – 2014-11-26 (×2): 5 mg via ORAL
  Filled 2014-11-24 (×2): qty 1

## 2014-11-24 MED ORDER — HEPARIN SODIUM (PORCINE) 5000 UNIT/ML IJ SOLN
5000.0000 [IU] | Freq: Three times a day (TID) | INTRAMUSCULAR | Status: DC
Start: 2014-11-24 — End: 2014-11-26
  Administered 2014-11-24 – 2014-11-25 (×2): 5000 [IU] via SUBCUTANEOUS
  Filled 2014-11-24 (×5): qty 1

## 2014-11-24 MED ORDER — HYDROCORTISONE 2.5 % RE CREA
TOPICAL_CREAM | RECTAL | Status: AC
  Filled 2014-11-24: qty 28.35

## 2014-11-24 MED ORDER — SIMVASTATIN 20 MG PO TABS
20.0000 mg | ORAL_TABLET | Freq: Every day | ORAL | Status: DC
  Administered 2014-11-24 – 2014-11-25 (×2): 20 mg via ORAL
  Filled 2014-11-24 (×2): qty 1

## 2014-11-24 MED ORDER — SODIUM CHLORIDE 0.9 % IV SOLN
INTRAVENOUS | Status: DC
  Administered 2014-11-24 (×2): via INTRAVENOUS

## 2014-11-24 MED ORDER — CHLORHEXIDINE GLUCONATE 0.12 % MT SOLN
10.0000 mL | Freq: Four times a day (QID) | OROMUCOSAL | Status: DC
  Administered 2014-11-24 – 2014-11-26 (×7): 10 mL via OROMUCOSAL
  Filled 2014-11-24 (×6): qty 15

## 2014-11-24 MED ORDER — LAMOTRIGINE 25 MG PO TABS
25.0000 mg | ORAL_TABLET | Freq: Two times a day (BID) | ORAL | Status: DC
  Administered 2014-11-24 – 2014-11-26 (×4): 25 mg via ORAL
  Filled 2014-11-24 (×8): qty 1

## 2014-11-24 MED ORDER — LEVETIRACETAM 500 MG PO TABS
500.0000 mg | ORAL_TABLET | Freq: Two times a day (BID) | ORAL | Status: DC
  Administered 2014-11-24 – 2014-11-26 (×4): 500 mg via ORAL
  Filled 2014-11-24 (×4): qty 1

## 2014-11-24 MED ORDER — ONDANSETRON HCL 4 MG PO TABS: 4.0000 mg | ORAL_TABLET | Freq: Four times a day (QID) | ORAL | Status: DC | PRN

## 2014-11-24 MED ORDER — SIMVASTATIN 20 MG PO TABS: 20.0000 mg | ORAL_TABLET | Freq: Every day | ORAL | Status: DC

## 2014-11-24 MED ORDER — ONDANSETRON HCL 4 MG/2ML IJ SOLN: 4.0000 mg | Freq: Four times a day (QID) | INTRAMUSCULAR | Status: DC | PRN

## 2014-11-24 NOTE — ED Notes (Signed)
Patient transported to MRI 

## 2014-11-24 NOTE — ED Notes (Addendum)
Spoke to patients brother and he states he thinks patients condition is related to Faynvega medication. [phone] - Brother. Brother states Group Home will not attempt to notify MD regarding change in Invega medication.

## 2014-11-24 NOTE — ED Provider Notes (Signed)
CSN: [id number]     Arrival date & time 11/24/14  1336 History  This chart was scribed for Vanetta Mulders, MD by Abel Presto, ED Scribe. This patient was seen in room [facility]14/[facility]14 and the patient's care was started at 2:43 PM.    Chief Complaint  Patient presents with  . Seizures    LEVEL 5 CAVEAT- MENTAL RETARDATION  Patient is a 59 y.o. female presenting with seizures. The history is provided by a caregiver and medical records. The history is limited by a developmental delay. No language interpreter was used.  Seizures HPI Comments: Christine Pena is a 59 y.o. female brought in by ambulance, with PMHx of HTN, seizures, and mental retardation who presents to the Emergency Department complaining of seizures with increased frequency over last several weeks. Pt presenting from [facility]. Pt was post ictal upon EMS arrival. Pt's sitter states pt was unable to ambulate yesterday and states has been increasingly lethargic. A nurse from the home has not arrived yet to present pt's history. Pt was seen in January for seizures and sitter notes similar complaint of difficulty ambulating at that time. Pt is taking Lamictal, Keppra, and Luminal for seizures. Pt is alert and following vocal commands.   Past Medical History  Diagnosis Date  . Hypertension   . Seizures   . Mental retardation    History reviewed. No pertinent past surgical history. Family History  Problem Relation Age of Onset  . Heart disease Mother    History  Substance Use Topics  . Smoking status: Never Smoker   . Smokeless tobacco: Never Used  . Alcohol Use: No   OB History    No data available     Review of Systems  Unable to perform ROS: Other  Neurological: Positive for seizures.      Allergies  Depakote and Haldol  Home Medications   Prior to Admission medications   Medication Sig Start Date End Date Taking? Authorizing Provider  chlorhexidine (PERIDEX) 0.12 % solution Use as directed 10 mLs in  the mouth or throat 4 (four) times daily.   Yes Historical Provider, MD  hydrocortisone (ANUSOL-HC) 2.5 % rectal cream Place 1 application rectally 2 (two) times daily.   Yes Historical Provider, MD  lamoTRIgine (LAMICTAL) 25 MG tablet Take 2 tablets (50 mg total) by mouth 2 (two) times daily. Patient taking differently: Take 25 mg by mouth 2 (two) times daily.  10/11/14  Yes Lesle Chris Black, NP  levETIRAcetam (KEPPRA) 500 MG tablet Take 1 tablet (500 mg total) by mouth 2 (two) times daily. 05/05/14  Yes Lesle Chris Black, NP  moexipril (UNIVASC) 7.5 MG tablet Take 7.5 mg by mouth daily.  12/30/13  Yes Historical Provider, MD  oxybutynin (DITROPAN) 5 MG tablet Take 5 mg by mouth daily.  12/30/13  Yes Historical Provider, MD  paliperidone (INVEGA) 6 MG 24 hr tablet Take 6 mg by mouth daily.   Yes Historical Provider, MD  PHENobarbital (LUMINAL) 64.8 MG tablet Take 2 tablets (129.6 mg total) by mouth at bedtime. Patient taking differently: Take 129.6 mg by mouth at bedtime.  10/11/14  Yes Lesle Chris Black, NP  simvastatin (ZOCOR) 20 MG tablet Take 20 mg by mouth daily.  12/30/13  Yes Historical Provider, MD  Vitamin D, Ergocalciferol, (DRISDOL) 50000 UNITS CAPS capsule Take 50,000 Units by mouth every 7 (seven) days. Takes on Saturdays.   Yes Historical Provider, MD  vitamin E 200 UNIT capsule Take 200 Units by mouth daily.  Yes Historical Provider, MD  QUEtiapine (SEROQUEL) 50 MG tablet Take 1 tablet (50 mg total) by mouth at bedtime. 10/11/14   Gwenyth BenderKaren M Black, NP   BP 103/55 mmHg  Pulse 68  Temp(Src) 98.7 F (37.1 C) (Oral)  Resp 21  SpO2 100% Physical Exam  Constitutional: She is oriented to person, place, and time. She appears well-developed and well-nourished.  Pt following verbal commands  HENT:  Head: Normocephalic.  Eyes: Conjunctivae are normal. Pupils are equal, round, and reactive to light. No scleral icterus.  Neck: Normal range of motion. Neck supple.  Cardiovascular: Normal rate, regular rhythm  and normal heart sounds.  Exam reveals no friction rub.   No murmur heard. Pulmonary/Chest: Effort normal and breath sounds normal.  Abdominal: Soft. There is no tenderness.  Musculoskeletal: Normal range of motion. She exhibits no edema.  Neurological: She is alert and oriented to person, place, and time.  Skin: Skin is warm and dry.  Psychiatric: She has a normal mood and affect. Her behavior is normal.  Nursing note and vitals reviewed.   ED Course  Procedures (including critical care time) DIAGNOSTIC STUDIES: Oxygen Saturation is 100% on oxygen, normal by my interpretation.    COORDINATION OF CARE: 2:50 PM Discussed treatment plan with patient at beside, the patient agrees with the plan and has no further questions at this time.   Labs Review Labs Reviewed  CULTURE, BLOOD (ROUTINE X 2)  CULTURE, BLOOD (ROUTINE X 2)  URINE CULTURE  CBC WITH DIFFERENTIAL/PLATELET  BASIC METABOLIC PANEL  PHENOBARBITAL LEVEL  I-STAT CG4 LACTIC ACID, ED    Imaging Review No results found.   EKG Interpretation None      Results for orders placed or performed during the hospital encounter of 11/24/14  CBC WITH DIFFERENTIAL  Result Value Ref Range   WBC 7.4 4.0 - 10.5 K/uL   RBC 4.29 3.87 - 5.11 MIL/uL   Hemoglobin 12.6 12.0 - 15.0 g/dL   HCT 62.137.3 30.836.0 - 65.746.0 %   MCV 86.9 78.0 - 100.0 fL   MCH 29.4 26.0 - 34.0 pg   MCHC 33.8 30.0 - 36.0 g/dL   RDW 84.612.4 96.211.5 - 95.215.5 %   Platelets 341 150 - 400 K/uL   Neutrophils Relative % 63 43 - 77 %   Neutro Abs 4.6 1.7 - 7.7 K/uL   Lymphocytes Relative 30 12 - 46 %   Lymphs Abs 2.2 0.7 - 4.0 K/uL   Monocytes Relative 7 3 - 12 %   Monocytes Absolute 0.5 0.1 - 1.0 K/uL   Eosinophils Relative 0 0 - 5 %   Eosinophils Absolute 0.0 0.0 - 0.7 K/uL   Basophils Relative 0 0 - 1 %   Basophils Absolute 0.0 0.0 - 0.1 K/uL   Results for orders placed or performed during the hospital encounter of 11/24/14  Basic metabolic panel  Result Value Ref Range    Sodium 140 135 - 145 mmol/L   Potassium 3.4 (L) 3.5 - 5.1 mmol/L   Chloride 104 96 - 112 mmol/L   CO2 27 19 - 32 mmol/L   Glucose, Bld 106 (H) 70 - 99 mg/dL   BUN 8 6 - 23 mg/dL   Creatinine, Ser 8.410.59 0.50 - 1.10 mg/dL   Calcium 9.6 8.4 - 32.410.5 mg/dL   GFR calc non Af Amer >90 >90 mL/min   GFR calc Af Amer >90 >90 mL/min   Anion gap 9 5 - 15  CBC WITH DIFFERENTIAL  Result Value  Ref Range   WBC 7.4 4.0 - 10.5 K/uL   RBC 4.29 3.87 - 5.11 MIL/uL   Hemoglobin 12.6 12.0 - 15.0 g/dL   HCT 16.1 09.6 - 04.5 %   MCV 86.9 78.0 - 100.0 fL   MCH 29.4 26.0 - 34.0 pg   MCHC 33.8 30.0 - 36.0 g/dL   RDW 40.9 81.1 - 91.4 %   Platelets 341 150 - 400 K/uL   Neutrophils Relative % 63 43 - 77 %   Neutro Abs 4.6 1.7 - 7.7 K/uL   Lymphocytes Relative 30 12 - 46 %   Lymphs Abs 2.2 0.7 - 4.0 K/uL   Monocytes Relative 7 3 - 12 %   Monocytes Absolute 0.5 0.1 - 1.0 K/uL   Eosinophils Relative 0 0 - 5 %   Eosinophils Absolute 0.0 0.0 - 0.7 K/uL   Basophils Relative 0 0 - 1 %   Basophils Absolute 0.0 0.0 - 0.1 K/uL  Phenobarbital level  Result Value Ref Range   Phenobarbital 24.8 15.0 - 40.0 ug/mL  Urinalysis, Routine w reflex microscopic  Result Value Ref Range   Color, Urine YELLOW YELLOW   APPearance CLEAR CLEAR   Specific Gravity, Urine <1.005 (L) 1.005 - 1.030   pH 5.5 5.0 - 8.0   Glucose, UA NEGATIVE NEGATIVE mg/dL   Hgb urine dipstick TRACE (A) NEGATIVE   Bilirubin Urine NEGATIVE NEGATIVE   Ketones, ur NEGATIVE NEGATIVE mg/dL   Protein, ur NEGATIVE NEGATIVE mg/dL   Urobilinogen, UA 0.2 0.0 - 1.0 mg/dL   Nitrite NEGATIVE NEGATIVE   Leukocytes, UA NEGATIVE NEGATIVE  Lipase, blood  Result Value Ref Range   Lipase 18 11 - 59 U/L  Hepatic function panel  Result Value Ref Range   Total Protein 7.0 6.0 - 8.3 g/dL   Albumin 4.0 3.5 - 5.2 g/dL   AST 17 0 - 37 U/L   ALT 12 0 - 35 U/L   Alkaline Phosphatase 56 39 - 117 U/L   Total Bilirubin 0.3 0.3 - 1.2 mg/dL   Bilirubin, Direct 0.1 0.0 -  0.5 mg/dL   Indirect Bilirubin 0.2 (L) 0.3 - 0.9 mg/dL  Urine microscopic-add on  Result Value Ref Range   Squamous Epithelial / LPF MANY (A) RARE   WBC, UA 0-2 <3 WBC/hpf   RBC / HPF 3-6 <3 RBC/hpf   Bacteria, UA FEW (A) RARE  I-Stat CG4 Lactic Acid, ED  Result Value Ref Range   Lactic Acid, Venous 0.51 0.5 - 2.0 mmol/L      No results found.    MDM   Final diagnoses:  Altered mental status    Patient from group home. Patient with known history of seizures. On Keppra Lamictal phenobarbital phenobarbital level is therapeutic. Patient here without any seizure activity. Workup negative to include head CT without acute findings. No evidence urinary tract infection. No evidence of sepsis lactic acid is less than 1. No significant liver function test abnormalities. No leukocytosis no significant anemia. Chest x-ray also negative for pneumonia. However patient will not ambulate patient's been back at the group home for just one week after an extensive stay and rehabilitation facility. Group home members saying they cannot take care of her if she is not ambulatory. Check cause of the reason for not ambulating is not clear MRI brain has not been done so stroke is not been completely ruled out. At this point in time patient will require admission. We'll discuss with hospitalist.    I  personally performed the services described in this documentation, which was scribed in my presence. The recorded information has been reviewed and is accurate.      Vanetta Mulders, MD 11/24/14 1719

## 2014-11-24 NOTE — ED Notes (Signed)
PeriCare completed prior to temp sensing foley insertion, foley inserted via sterile technique with ED tech at bedside per policy.

## 2014-11-24 NOTE — ED Notes (Signed)
Darl PikesSusan, MRI Tech, at bedside and states patient cannot go through MRI with temp sensing foley in place - Dr Randol KernGosroni notified and Foley removed.

## 2014-11-24 NOTE — ED Notes (Addendum)
After a lengthy conversation with [name], patients brother, he states patient was treated in January for AMS and it was determined Invega and Seroquel was causing AMS - pt taken off of Invega and Seroquel at discharge to rehab facility. [facility] and [facility] placed patient back on Invega. Brother states patient has declined over the past week. Pt's brother does not want patient on this medication. Notified accepting physician, Dr. Karilyn CotaGosrani, Hospitalist. Orders given to Consult Neurology and hold/discontinue medication for now. Updated patients brother.

## 2014-11-24 NOTE — ED Notes (Signed)
Pt BP trending 83/50. Normal Saline 1L bolus initiated, EDP notified.

## 2014-11-24 NOTE — H&P (Signed)
[facility] History and Physical  Christine Pena WUJ:[id number] DOB: 1955-12-22 DOA: 11/24/2014  Referring physician: ER PCP: Colette Ribas, MD   Chief Complaint: Altered mental status.  HPI: Christine Pena is a 59 y.o. female  This is a 59 year old lady, who has mental retardation and seizures, who presented apparently with seizures from the group home that she lives that but then was unable to walk. She apparently has not been ambulating for the last 24-36 hours. She does take several anticonvulsant medications. The patient herself, due to the mental retardation, is unable to give me any history. The patient also has hypertension. She is now being admitted for further investigation.   Review of Systems:  Patient unable to give me a review of systems secondary to her mental retardation..   Past Medical History  Diagnosis Date  . Hypertension   . Seizures   . Mental retardation    History reviewed. No pertinent past surgical history. Social History:  reports that she has never smoked. She has never used smokeless tobacco. She reports that she does not drink alcohol or use illicit drugs.  Allergies  Allergen Reactions  . Depakote [Valproic Acid]     altered mental status/ high ammonia  . Haldol [Haloperidol Lactate]     Unknown reaction.    Family History  Problem Relation Age of Onset  . Heart disease Mother       Prior to Admission medications   Medication Sig Start Date End Date Taking? Authorizing Provider  chlorhexidine (PERIDEX) 0.12 % solution Use as directed 10 mLs in the mouth or throat 4 (four) times daily.   Yes Historical Provider, MD  hydrocortisone (ANUSOL-HC) 2.5 % rectal cream Place 1 application rectally 2 (two) times daily.   Yes Historical Provider, MD  lamoTRIgine (LAMICTAL) 25 MG tablet Take 2 tablets (50 mg total) by mouth 2 (two) times daily. Patient taking differently: Take 25 mg by mouth 2 (two) times daily.  10/11/14  Yes Lesle Chris  Black, NP  levETIRAcetam (KEPPRA) 500 MG tablet Take 1 tablet (500 mg total) by mouth 2 (two) times daily. 05/05/14  Yes Lesle Chris Black, NP  moexipril (UNIVASC) 7.5 MG tablet Take 7.5 mg by mouth daily.  12/30/13  Yes Historical Provider, MD  oxybutynin (DITROPAN) 5 MG tablet Take 5 mg by mouth daily.  12/30/13  Yes Historical Provider, MD  paliperidone (INVEGA) 6 MG 24 hr tablet Take 6 mg by mouth daily.   Yes Historical Provider, MD  PHENobarbital (LUMINAL) 64.8 MG tablet Take 2 tablets (129.6 mg total) by mouth at bedtime. Patient taking differently: Take 129.6 mg by mouth at bedtime.  10/11/14  Yes Lesle Chris Black, NP  simvastatin (ZOCOR) 20 MG tablet Take 20 mg by mouth daily.  12/30/13  Yes Historical Provider, MD  Vitamin D, Ergocalciferol, (DRISDOL) 50000 UNITS CAPS capsule Take 50,000 Units by mouth every 7 (seven) days. Takes on Saturdays.   Yes Historical Provider, MD  vitamin E 200 UNIT capsule Take 200 Units by mouth daily.   Yes Historical Provider, MD  QUEtiapine (SEROQUEL) 50 MG tablet Take 1 tablet (50 mg total) by mouth at bedtime. 10/11/14   Gwenyth Bender, NP   Physical Exam: Filed Vitals:   11/24/14 1645 11/24/14 1700 11/24/14 1715 11/24/14 1730  BP: 120/59 125/75 128/70 132/72  Pulse: 69 77 84 75  Temp:      TempSrc:      Resp: SpO2: 100% 99%  96% 97%    Wt Readings from Last 3 Encounters:  10/08/14 92 kg (202 lb 13.2 oz)  05/05/14 112.56 kg (248 lb 2.4 oz)  01/06/14 112.492 kg (248 lb)    General:  Appears calm and comfortable Eyes: PERRL, normal lids, irises & conjunctiva ENT: grossly normal hearing, lips & tongue Neck: no LAD, masses or thyromegaly Cardiovascular: RRR, no m/r/g. No LE edema. Telemetry: SR, no arrhythmias  Respiratory: CTA bilaterally, no w/r/r. Normal respiratory effort. Abdomen: soft, ntnd Skin: no rash or induration seen on limited exam Musculoskeletal: grossly normal tone BUE/BLE Psychiatric: Not tested. Neurologic: grossly  non-focal.          Labs on Admission:  Basic Metabolic Panel:  Recent Labs Lab 11/24/14 1401  NA 140  K 3.4*  CL 104  CO2 27  GLUCOSE 106*  BUN 8  CREATININE 0.59  CALCIUM 9.6   Liver Function Tests:  Recent Labs Lab 11/24/14 1401  AST 17  ALT 12  ALKPHOS 56  BILITOT 0.3  PROT 7.0  ALBUMIN 4.0    Recent Labs Lab 11/24/14 1401  LIPASE 18   No results for input(s): AMMONIA in the last 168 hours. CBC:  Recent Labs Lab 11/24/14 1401  WBC 7.4  NEUTROABS 4.6  HGB 12.6  HCT 37.3  MCV 86.9  PLT 341   Cardiac Enzymes: No results for input(s): CKTOTAL, CKMB, CKMBINDEX, TROPONINI in the last 168 hours.  BNP (last 3 results) No results for input(s): BNP in the last 8760 hours.  ProBNP (last 3 results) No results for input(s): PROBNP in the last 8760 hours.  CBG: No results for input(s): GLUCAP in the last 168 hours.  Radiological Exams on Admission: Ct Head Wo Contrast  11/24/2014   CLINICAL DATA:  Altered mental status, seizures  EXAM: CT HEAD WITHOUT CONTRAST  TECHNIQUE: Contiguous axial images were obtained from the base of the skull through the vertex without intravenous contrast.  COMPARISON:  10/08/2014  FINDINGS: No skull fracture is noted. Paranasal sinuses and mastoid air cells are unremarkable. No intracranial hemorrhage, mass effect or midline shift. Marked cerebellar atrophy is stable.  No acute cortical infarction. Ventricular size is stable from prior exam. No mass lesion is noted on this unenhanced scan. No intra or extra-axial fluid collection.  IMPRESSION: No acute intracranial abnormality. Stable marked cerebellar atrophy.   Electronically Signed   By: Natasha MeadLiviu  Pop M.D.   On: 11/24/2014 15:43   Dg Chest Port 1 View  11/24/2014   CLINICAL DATA:  Seizures and lethargy  EXAM: PORTABLE CHEST - 1 VIEW  COMPARISON:  October 08, 2014  FINDINGS: There is no edema or consolidation. Heart is upper normal in size with pulmonary vascularity within normal  limits. No adenopathy. No bone lesions.  IMPRESSION: No edema or consolidation.   Electronically Signed   By: Bretta BangWilliam  Woodruff III M.D.   On: 11/24/2014 15:10      Assessment/Plan   1. Altered mental status. The patient is unable to walk at this point in time and cannot go back to the group home. CT brain scan is unremarkable. We will obtain MRI brain scan for further evaluation. She may need neurology consultation if things have not improved tomorrow. 2. Hypertension, stable. 3. Seizure disorder. She does not appear to be having any active seizures at the present time. Continue with her home medications.  Further recommendations will depend on patient's hospital progress.   Code Status: Full code.   DVT Prophylaxis: Heparin.  Family  Communication: No family members at the bedside.   Disposition Plan: Back to the group home when medically stable.  Time spent: 45 minutes.  Christine Pena [facility] Pager [phone].

## 2014-11-24 NOTE — ED Notes (Addendum)
Pt presents from [facility] for seizures - increased frequency over the past few weeks. Pt was post ictal on EMS arrival. Pt currently has focal twitching to right cheek area. Pt alert, and responds at this time. Pt sleepy. CBG 148.

## 2014-11-25 ENCOUNTER — Encounter (HOSPITAL_COMMUNITY): Payer: Self-pay | Admitting: *Deleted

## 2014-11-25 DIAGNOSIS — F79 Unspecified intellectual disabilities: Secondary | ICD-10-CM | POA: Diagnosis not present

## 2014-11-25 DIAGNOSIS — I10 Essential (primary) hypertension: Secondary | ICD-10-CM | POA: Diagnosis not present

## 2014-11-25 DIAGNOSIS — R569 Unspecified convulsions: Secondary | ICD-10-CM | POA: Diagnosis not present

## 2014-11-25 DIAGNOSIS — R400 Somnolence: Secondary | ICD-10-CM

## 2014-11-25 LAB — COMPREHENSIVE METABOLIC PANEL
ALK PHOS: 47 U/L (ref 39–117)
ALT: 11 U/L (ref 0–35)
ANION GAP: 6 (ref 5–15)
AST: 10 U/L (ref 0–37)
Albumin: 3.5 g/dL (ref 3.5–5.2)
BUN: 5 mg/dL — ABNORMAL LOW (ref 6–23)
CO2: 26 mmol/L (ref 19–32)
Calcium: 8.9 mg/dL (ref 8.4–10.5)
Chloride: 109 mmol/L (ref 96–112)
Creatinine, Ser: 0.52 mg/dL (ref 0.50–1.10)
GFR calc Af Amer: >90 mL/min (ref >90)
GLUCOSE: 90 mg/dL (ref 70–99)
POTASSIUM: 3.7 mmol/L (ref 3.5–5.1)
SODIUM: 141 mmol/L (ref 135–145)
Total Bilirubin: 0.4 mg/dL (ref 0.3–1.2)
Total Protein: 6.1 g/dL (ref 6.0–8.3)

## 2014-11-25 LAB — URINE CULTURE
CULTURE: NO GROWTH
Colony Count: NO GROWTH

## 2014-11-25 LAB — CBC
HCT: 34.1 % — ABNORMAL LOW (ref 36.0–46.0)
Hemoglobin: 11.4 g/dL — ABNORMAL LOW (ref 12.0–15.0)
MCH: 29 pg (ref 26.0–34.0)
MCHC: 33.4 g/dL (ref 30.0–36.0)
MCV: 86.8 fL (ref 78.0–100.0)
Platelets: 258 10*3/uL (ref 150–400)
RBC: 3.93 MIL/uL (ref 3.87–5.11)
RDW: 12.3 % (ref 11.5–15.5)
WBC: 4.5 10*3/uL (ref 4.0–10.5)

## 2014-11-25 LAB — TROPONIN I: Troponin I: <0.03 ng/mL (ref <0.031)

## 2014-11-25 LAB — AMMONIA: Ammonia: 16 umol/L (ref 11–32)

## 2014-11-25 LAB — MRSA PCR SCREENING: MRSA BY PCR: POSITIVE — AB

## 2014-11-25 MED ORDER — LORAZEPAM 2 MG/ML IJ SOLN: 1.0000 mg | INTRAMUSCULAR | Status: DC | PRN

## 2014-11-25 MED ORDER — MUPIROCIN 2 % EX OINT
1.0000 "application " | TOPICAL_OINTMENT | Freq: Two times a day (BID) | CUTANEOUS | Status: DC
  Administered 2014-11-25 – 2014-11-26 (×3): 1 via NASAL
  Filled 2014-11-25 (×2): qty 22

## 2014-11-25 MED ORDER — KCL IN DEXTROSE-NACL 40-5-0.9 MEQ/L-%-% IV SOLN
INTRAVENOUS | Status: AC
  Administered 2014-11-25 (×2): via INTRAVENOUS
  Filled 2014-11-25 (×2): qty 1000

## 2014-11-25 MED ORDER — CHLORHEXIDINE GLUCONATE CLOTH 2 % EX PADS
6.0000 | MEDICATED_PAD | Freq: Every day | CUTANEOUS | Status: DC
  Administered 2014-11-25 – 2014-11-26 (×2): 6 via TOPICAL

## 2014-11-25 NOTE — Progress Notes (Signed)
Patient Demographics  Christine Pena, is a 59 y.o. female, DOB - 1956-07-25, YPP:[id number]  Admit date - 11/24/2014   Admitting Physician Wilson Singer, MD  Outpatient Primary MD for the patient is Colette Ribas, MD  LOS -    Chief Complaint  Patient presents with  . Seizures        Subjective:   Christine Pena today has, No headache, No chest pain, No abdominal pain - No Nausea, No new weakness tingling or numbness, No Cough - SOB.    Assessment & Plan    1. Rectal seizures with decreased mental status in the postictal phase. Seizures resolved now back to baseline, she is on 3 antiseizure medications, phenobarb levels are stable, have ordered Keppra levels, continue Lamictal. IV Ativan for breakthrough seizure when necessary, Have requested neurology to evaluate. Head CT unremarkable. No focal deficits and now likely close to her baseline.   2.MR - supportive care, SNF vs group home upon DC, PT consulted    3.HTN - stable off Meds   4. Dyslipidemia. Continue statin at home dose.    5. Hypokalemia. Replaced.      Code Status: Full  Family Communication: none present  Disposition Plan: group home   Procedures CT Head   Consults  Neuro   Medications  Scheduled Meds: . chlorhexidine  10 mL Mouth/Throat QID  . Chlorhexidine Gluconate Cloth  6 each Topical Q0600  . heparin  5,000 Units Subcutaneous 3 times per day  . hydrocortisone  1 application Rectal BID  . lamoTRIgine  25 mg Oral BID  . levETIRAcetam  500 mg Oral BID  . mupirocin ointment  1 application Nasal BID  . oxybutynin  5 mg Oral Daily  . PHENobarbital  129.6 mg Oral QHS  . simvastatin  20 mg Oral q1800  . trandolapril  1 mg Oral Daily  . Vitamin D (Ergocalciferol)  50,000 Units Oral Q7 days  .  vitamin E  200 Units Oral Daily   Continuous Infusions: . dextrose 5 % and 0.9 % NaCl with KCl 40 mEq/L 75 mL/hr at 11/25/14 0911   PRN Meds:.ondansetron **OR** ondansetron (ZOFRAN) IV  DVT Prophylaxis   Heparin   Lab Results  Component Value Date   PLT 258 11/25/2014    Antibiotics     Anti-infectives    None          Objective:   Filed Vitals:   11/24/14 1800 11/24/14 1815 11/24/14 1951 11/25/14 0630  BP: 119/67 124/67 150/71 134/74  Pulse:   74 71  Temp:   98.9 F (37.2 C) 98.4 F (36.9 C)  TempSrc:   Oral Oral  Resp: Height:    (1.651 m)   Weight:   88.6 kg (195 lb 5.2 oz)   SpO2:   100% 100%    Wt Readings from Last 3 Encounters:  11/24/14 88.6 kg (195 lb 5.2 oz)  11/24/14 0.907 kg (2 lb)  10/08/14 92 kg (202 lb 13.2 oz)     Intake/Output Summary (Last 24 hours) at 11/25/14 0952 Last data filed at 11/25/14 0500  Gross per 24 hour  Intake 1002.5 ml  Output      0 ml  Net 1002.5 ml     Physical Exam  Awake , following basic commands, No new F.N deficits, Normal affect Christine Pena.AT,PERRAL Supple Neck,No JVD, No cervical lymphadenopathy appriciated.  Symmetrical Chest wall movement, Good air movement bilaterally, CTAB RRR,No Gallops,Rubs or new Murmurs, No Parasternal Heave +ve B.Sounds, Abd Soft, No tenderness, No organomegaly appriciated, No rebound - guarding or rigidity. No Cyanosis, Clubbing or edema, No new Rash or bruise     Data Review   Micro Results Recent Results (from the past 240 hour(s))  Culture, blood (routine x 2)     Status: None (Preliminary result)   Collection Time: 11/24/14  3:58 PM  Result Value Ref Range Status   Specimen Description BLOOD RIGHT ANTECUBITAL  Final   Special Requests BOTTLES DRAWN AEROBIC AND ANAEROBIC 6CC  Final   Culture NO GROWTH 1 DAY  Final   Report Status PENDING  Incomplete  Culture, blood (routine x 2)     Status: None (Preliminary result)   Collection Time: 11/24/14  4:05 PM    Result Value Ref Range Status   Specimen Description BLOOD RIGHT ARM  Final   Special Requests BOTTLES DRAWN AEROBIC AND ANAEROBIC 5CC  Final   Culture NO GROWTH 1 DAY  Final   Report Status PENDING  Incomplete  MRSA PCR Screening     Status: Abnormal   Collection Time: 11/24/14 11:01 PM  Result Value Ref Range Status   MRSA by PCR POSITIVE (A) NEGATIVE Final    Comment:        The GeneXpert MRSA Assay (FDA approved for NASAL specimens only), is one component of a comprehensive MRSA colonization surveillance program. It is not intended to diagnose MRSA infection nor to guide or monitor treatment for MRSA infections. RESULT CALLED TO, READ BACK BY AND VERIFIED WITH: RODGERS S AT 0105 ON [id number] BY FORSYTH K     Radiology Reports Ct Head Wo Contrast  11/24/2014   CLINICAL DATA:  Altered mental status, seizures  EXAM: CT HEAD WITHOUT CONTRAST  TECHNIQUE: Contiguous axial images were obtained from the base of the skull through the vertex without intravenous contrast.  COMPARISON:  10/08/2014  FINDINGS: No skull fracture is noted. Paranasal sinuses and mastoid air cells are unremarkable. No intracranial hemorrhage, mass effect or midline shift. Marked cerebellar atrophy is stable.  No acute cortical infarction. Ventricular size is stable from prior exam. No mass lesion is noted on this unenhanced scan. No intra or extra-axial fluid collection.  IMPRESSION: No acute intracranial abnormality. Stable marked cerebellar atrophy.   Electronically Signed   By: Natasha MeadLiviu  Pop M.D.   On: 11/24/2014 15:43   Mr Brain Wo Contrast  11/24/2014   CLINICAL DATA:  Altered mental status and seizures.  EXAM: MRI HEAD WITHOUT CONTRAST  TECHNIQUE: Multiplanar, multiecho pulse sequences of the brain and surrounding structures were obtained without intravenous contrast.  COMPARISON:  CT head without contrast from the same day. Previous CT of the head dating back to 05/02/2014  FINDINGS: Moderate cerebellar atrophy is  again noted. The supratentorial brain is normal in appearance. No acute infarct, hemorrhage, or mass lesion is present. The ventricles are of normal size. No significant extraaxial fluid collection is present.  Flow is present in the major intracranial arteries. The globes and orbits are intact. Minimal mucosal thickening is present in the ethmoid air cells, inferior right frontal sinus, in the right maxillary sinus. The sphenoid sinuses are clear. There is some fluid in the mastoid air cells bilaterally. No obstructing  nasopharyngeal lesion is evident.  IMPRESSION: 1. Moderate cerebellar atrophy. This is nonspecific, but can be seen in the setting of chronic anti epileptic therapy. 2. No acute or focal abnormality to explain seizures or altered mental status.   Electronically Signed   By: Marin Roberts M.D.   On: 11/24/2014 19:45   Dg Chest Port 1 View  11/24/2014   CLINICAL DATA:  Seizures and lethargy  EXAM: PORTABLE CHEST - 1 VIEW  COMPARISON:  October 08, 2014  FINDINGS: There is no edema or consolidation. Heart is upper normal in size with pulmonary vascularity within normal limits. No adenopathy. No bone lesions.  IMPRESSION: No edema or consolidation.   Electronically Signed   By: Bretta Bang III M.D.   On: 11/24/2014 15:10     CBC  Recent Labs Lab 11/24/14 1401 11/25/14 0658  WBC 7.4 4.5  HGB 12.6 11.4*  HCT 37.3 34.1*  PLT 341 258  MCV 86.9 86.8  MCH 29.4 29.0  MCHC 33.8 33.4  RDW 12.4 12.3  LYMPHSABS 2.2  --   MONOABS 0.5  --   EOSABS 0.0  --   BASOSABS 0.0  --     Chemistries   Recent Labs Lab 11/24/14 1401 11/25/14 0658  NA 140 141  K 3.4* 3.7  CL 104 109  CO2 27 26  GLUCOSE 106* 90  BUN 8 5*  CREATININE 0.59 0.52  CALCIUM 9.6 8.9  AST 17 10  ALT 12 11  ALKPHOS 56 47  BILITOT 0.3 0.4   ------------------------------------------------------------------------------------------------------------------ estimated creatinine clearance is 83.2 mL/min  (by C-G formula based on Cr of 0.52). ------------------------------------------------------------------------------------------------------------------ No results for input(s): HGBA1C in the last 72 hours. ------------------------------------------------------------------------------------------------------------------ No results for input(s): CHOL, HDL, LDLCALC, TRIG, CHOLHDL, LDLDIRECT in the last 72 hours. ------------------------------------------------------------------------------------------------------------------ No results for input(s): TSH, T4TOTAL, T3FREE, THYROIDAB in the last 72 hours.  Invalid input(s): FREET3 ------------------------------------------------------------------------------------------------------------------ No results for input(s): VITAMINB12, FOLATE, FERRITIN, TIBC, IRON, RETICCTPCT in the last 72 hours.  Coagulation profile No results for input(s): INR, PROTIME in the last 168 hours.  No results for input(s): DDIMER in the last 72 hours.  Cardiac Enzymes  Recent Labs Lab 11/25/14 0804  TROPONINI <0.03   ------------------------------------------------------------------------------------------------------------------ Invalid input(s): POCBNP     Time Spent in minutes   35   Zaiah Eckerson K M.D on 11/25/2014 at 9:52 AM  Between 7am to 7pm - Pager - [phone]  After 7pm go to [email or web address] - password [facility]  [facility]   Office  [phone]

## 2014-11-25 NOTE — Evaluation (Signed)
Physical Therapy Evaluation Patient Details Name: Christine EagleDebra L Pena MRN: [id number] DOB: May 23, 1956 Today's Date: 11/25/2014   History of Present Illness  This is a 59 year old lady, who has mental retardation and seizures, who presented apparently with seizures from the group home that she lives that but then was unable to walk. She apparently has not been ambulating for the last 24-36 hours. She does take several anticonvulsant medications. The patient herself, due to the mental retardation, is unable to give me any history. The patient also has hypertension. She is now being admitted for further investigation.  Clinical Impression  Per MSW, [facility] and did extremely well, able to ambulate independently (probably with a walker).  She returned to Group Home and was placed on a medication which had previously been discontinued.  She is now here with the same symptoms which caused her last hospitalization.  Pt is currently lethargic but able to arouse.  She had been on supplemental O2 but O2 sat on room air=97%.  She was able to follow simple directions intermittently and frequently cried wanting her mother.  Her functional status is quite decreased from baseline and she in currently unable to ambulate.  [facility] with standby assist.  I do anticipate that she will need SNF at d/c unless she makes very rapid progress to baseline.    Follow Up Recommendations SNF    Equipment Recommendations  None recommended by PT    Recommendations for Other Services   none    Precautions / Restrictions Precautions Precautions: Fall Precaution Comments: orange contact      Mobility  Bed Mobility Overal bed mobility: Needs Assistance Bed Mobility: Supine to Sit;Sit to Supine     Supine to sit: HOB elevated;Min assist Sit to supine: Min assist;HOB elevated      Transfers Overall transfer level: Needs assistance   Transfers: Stand Pivot Transfers    Stand pivot transfers: Min guard       General transfer comment: [facility]...able to stand to a walker but would not walk.  I am not sure if this was inability to walk or if she was too anxious to walk  Ambulation/Gait                Stairs            Wheelchair Mobility    Modified Rankin (Stroke Patients Only)       Balance Overall balance assessment: No apparent balance deficits (not formally assessed)                                           Pertinent Vitals/Pain Pain Assessment: No/denies pain    Home Living Family/patient expects to be discharged to:: Skilled nursing facility                 Additional Comments: pt from a group home    Prior Function Level of Independence: Needs assistance   Gait / Transfers Assistance Needed: unsure.Marland Kitchen.Marland Kitchen.It is presumed that she used a walker for gait and she was independent-people in a group home must be able to mobilize independently  ADL's / Homemaking Assistance Needed: unsure        Hand Dominance   Dominant Hand: Right    Extremity/Trunk Assessment  Lower Extremity Assessment: Generalized weakness (mildly deconditioned)      Cervical / Trunk Assessment: Kyphotic  Communication   Communication: No difficulties  Cognition Arousal/Alertness: Awake/alert Behavior During Therapy: Anxious Overall Cognitive Status: History of cognitive impairments - at baseline                      General Comments      Exercises        Assessment/Plan    PT Assessment Patient needs continued PT services  PT Diagnosis Generalized weakness;Difficulty walking   PT Problem List Decreased strength;Decreased activity tolerance;Decreased mobility  PT Treatment Interventions Gait training;Functional mobility training;Therapeutic exercise   PT Goals (Current goals can be found in the Care Plan section) Acute Rehab PT Goals Patient Stated Goal: none  stated PT Goal Formulation: With patient Time For Goal Achievement: 12/09/14 Potential to Achieve Goals: Good    Frequency Min 3X/week   Barriers to discharge Decreased caregiver support      Co-evaluation               End of Session Equipment Utilized During Treatment: Gait belt Activity Tolerance: Patient tolerated treatment well Patient left: in bed;with call bell/phone within reach;with bed alarm set Nurse Communication: Mobility status    Functional Assessment Tool Used: clinical judgement Functional Limitation: Mobility: Walking and moving around Mobility: Walking and Moving Around Current Status (Z6109): At least 40 percent but less than 60 percent impaired, limited or restricted Mobility: Walking and Moving Around Goal Status [phone]): At least 1 percent but less than 20 percent impaired, limited or restricted    Time: 0946-1012 PT Time Calculation (min) (ACUTE ONLY): 26 min   Charges:   PT Evaluation $Initial PT Evaluation Tier I: 1 Procedure     PT G Codes:   PT G-Codes **NOT FOR INPATIENT CLASS** Functional Assessment Tool Used: clinical judgement Functional Limitation: Mobility: Walking and moving around Mobility: Walking and Moving Around Current Status (U9811): At least 40 percent but less than 60 percent impaired, limited or restricted Mobility: Walking and Moving Around Goal Status [phone]): At least 1 percent but less than 20 percent impaired, limited or restricted    Christine Pena 11/25/2014, 10:45 AM

## 2014-11-25 NOTE — Clinical Social Work Psychosocial (Signed)
Clinical Social Work Department BRIEF PSYCHOSOCIAL ASSESSMENT 11/25/2014  Patient:  Gareth EagleDRAUGHN,Teresina L     Account Number:  [id number]     Admit date:  11/24/2014  Clinical Social Worker:  Nancie NeasSTULTZ,Tashona Calk, LCSW  Date/Time:  11/25/2014 10:49 AM  Referred by:  CSW  Date Referred:  11/25/2014 Referred for  SNF Placement   Other Referral:   Interview type:  Family Other interview type:   mother- Gladys    PSYCHOSOCIAL DATA Living Status:  FACILITY Admitted from facility:  Other Level of care:  Group Home Primary support name:  [name] Primary support relationship to patient:  PARENT Degree of support available:   mother is legal guardian    CURRENT CONCERNS Current Concerns  Post-Acute Placement   Other Concerns:    SOCIAL WORK ASSESSMENT / PLAN CSW spoke with Emma at [facility] regarding pt. Ivylynn Hoppes Meadmma reports pt had [facility] about a week ago after completing rehab. It was felt that pt's symptoms were [facility] and this medication was stopped. [facility], but was restarted on [facility] sometime before returning to [facility]. Pt returned to ED with similar symptoms again. Shaira Sova Meadmma reports that pt fell on Wednesday and Thursday and was unable to even stand yesterday. She also had garbled speech yesterday. She had to use wheelchair last few days. Pt participates in day programs and did well at the beginning of the week. Darrall Strey Meadmma reports that pt has to be able to attend these programs in order to return. PT evaluated pt and recommendation is for SNF. Emma notified. CSW spoke with pt's mother/legal guardian, [name] who [facility]. [name] reports that pt lived with her for 58 years, but last year when she had to go to SNF, pt had to be placed. [name] became emotional on the phone as this has been a very difficult year for them to adjust. CSW provided support. She understands recommendation and would really [place] for [place]  [place], but is open to [place] or [place] if necessary. SNF list left in room.   Assessment/plan status:  Psychosocial Support/Ongoing Assessment of Needs Other assessment/ plan:   Information/referral to community resources:   SNF list    PATIENT'S/FAMILY'S RESPONSE TO PLAN OF CARE: Pt's mother is agreeable to short term SNF. CSW will follow up with bed offers when available.       Derenda FennelKara Lindon Kiel, Kentucky[place]CSW [phone]

## 2014-11-25 NOTE — Clinical Social Work Note (Signed)
Pt's mother accepts bed at [facility]. Facility notified and will complete paperwork with mother. Awaiting pasarr. Anticipate d/c tomorrow per MD.  Derenda FennelKara Salar Molden, LCSW [phone]

## 2014-11-25 NOTE — Progress Notes (Signed)
Patient has a positive MRSA screen. Contacted MD on call to inform of this. Positive MRSA order set has been initiated. Patient is on contact precautions.

## 2014-11-25 NOTE — Progress Notes (Signed)
UR completed 

## 2014-11-25 NOTE — Clinical Social Work Note (Signed)
30 day pasarr received. [facility] notified.  Derenda FennelKara Ivonna Kinnick, [place] [phone]

## 2014-11-25 NOTE — Progress Notes (Signed)
Attempted to call and ask some admission questions that patient is unable to answer due to history of mental retardation. [facility]. Will continue to try and get in touch with [facility].

## 2014-11-25 NOTE — Clinical Social Work Placement (Signed)
Clinical Social Work Department CLINICAL SOCIAL WORK PLACEMENT NOTE 11/25/2014  Patient:  Gareth EagleDRAUGHN,Shlonda L  Account Number:  [id number] Admit date:  11/24/2014  Clinical Social Worker:  Derenda FennelKARA Fama Muenchow, LCSW  Date/time:  11/25/2014 10:48 AM  Clinical Social Work is seeking post-discharge placement for this patient at the following level of care:   SKILLED NURSING   (*CSW will update this form in Epic as items are completed)   11/25/2014  Patient/family provided with [facility] Department of Clinical Social Work's list of facilities offering this level of care within the geographic area requested by the patient (or if unable, by the patient's family).  11/25/2014  Patient/family informed of their freedom to choose among providers that offer the needed level of care, that participate in Medicare, Medicaid or managed care program needed by the patient, have an available bed and are willing to accept the patient.  11/25/2014  Patient/family informed of [facility]' ownership interest in [facility], as well as of the fact that they are under no obligation to receive care at this facility.  PASARR submitted to EDS on 11/25/2014 PASARR number received on   FL2 transmitted to all facilities in geographic area requested by pt/family on  11/25/2014 FL2 transmitted to all facilities within larger geographic area on   Patient informed that his/her managed care company has contracts with or will negotiate with  certain facilities, including the following:     Patient/family informed of bed offers received:  11/25/2014 Patient chooses bed at [facility] [place] Physician recommends and patient chooses bed at    Patient to be transferred to  on   Patient to be transferred to facility by  Patient and family notified of transfer on  Name of family member notified:    The following physician request were entered in Epic:   Additional Comments:  Derenda FennelKara Dereka Lueras, LCSW [phone]

## 2014-11-25 NOTE — Clinical Social Work Placement (Signed)
Clinical Social Work Department CLINICAL SOCIAL WORK PLACEMENT NOTE 11/25/2014  Patient:  Gareth EagleDRAUGHN,Jadence L  Account Number:  [id number] Admit date:  11/24/2014  Clinical Social Worker:  Derenda FennelKARA Kaelene Elliston, LCSW  Date/time:  11/25/2014 10:48 AM  Clinical Social Work is seeking post-discharge placement for this patient at the following level of care:   SKILLED NURSING   (*CSW will update this form in Epic as items are completed)   11/25/2014  Patient/family provided with [facility] Department of Clinical Social Work's list of facilities offering this level of care within the geographic area requested by the patient (or if unable, by the patient's family).  11/25/2014  Patient/family informed of their freedom to choose among providers that offer the needed level of care, that participate in Medicare, Medicaid or managed care program needed by the patient, have an available bed and are willing to accept the patient.  11/25/2014  Patient/family informed of [facility] ownership interest in [facility], as well as of the fact that they are under no obligation to receive care at this facility.  PASARR submitted to EDS on 11/25/2014 PASARR number received on   FL2 transmitted to all facilities in geographic area requested by pt/family on  11/25/2014 FL2 transmitted to all facilities within larger geographic area on   Patient informed that his/her managed care company has contracts with or will negotiate with  certain facilities, including the following:     Patient/family informed of bed offers received:   Patient chooses bed at  Physician recommends and patient chooses bed at    Patient to be transferred to  on   Patient to be transferred to facility by  Patient and family notified of transfer on  Name of family member notified:    The following physician request were entered in Epic:   Additional Comments:  Derenda FennelKara Ruble Pumphrey, LCSW [phone]

## 2014-11-25 NOTE — Care Management Note (Addendum)
    Page 1 of 1   11/25/2014     11:55:15 AM CARE MANAGEMENT NOTE 11/25/2014  Patient:  Christine Pena,Christine Pena   Account Number:  [id number]  Date Initiated:  11/25/2014  Documentation initiated by:  Sharrie RothmanBLACKWELL,Gavrielle Streck C  Subjective/Objective Assessment:   Pt admitted from [facility] with altered mental status. Pt may need higher level of care at discharge.     Action/Plan:   PT recommends SNF. CSW is aware and will start bed search. Anticipate discharge within 24 hours.   Anticipated DC Date:  11/26/2014   Anticipated DC Plan:  SKILLED NURSING FACILITY  In-house referral  Clinical Social Worker      DC Planning Services  CM consult      Choice offered to / List presented to:             Status of service:  Completed, signed off Medicare Important Message given?   (If response is "NO", the following Medicare IM given date fields will be blank) Date Medicare IM given:   Medicare IM given by:   Date Additional Medicare IM given:   Additional Medicare IM given by:    Discharge Disposition:  SKILLED NURSING FACILITY  Per UR Regulation:    If discussed at Long Length of Stay Meetings, dates discussed:    Comments:  11/25/14 1150 Arlyss Queenammy Zully Frane, RN BSN CM Pt has SNF bed at [facility]. Anticipate discharge over the weekend. CSW to arrange discharge to facility.  11/25/14 1100 Arlyss Queenammy Sheehan Stacey, RN BSN CM

## 2014-11-26 DIAGNOSIS — E559 Vitamin D deficiency, unspecified: Secondary | ICD-10-CM | POA: Diagnosis not present

## 2014-11-26 DIAGNOSIS — E876 Hypokalemia: Secondary | ICD-10-CM | POA: Diagnosis not present

## 2014-11-26 DIAGNOSIS — E119 Type 2 diabetes mellitus without complications: Secondary | ICD-10-CM | POA: Diagnosis not present

## 2014-11-26 DIAGNOSIS — Z7952 Long term (current) use of systemic steroids: Secondary | ICD-10-CM | POA: Diagnosis not present

## 2014-11-26 DIAGNOSIS — R4182 Altered mental status, unspecified: Secondary | ICD-10-CM | POA: Diagnosis not present

## 2014-11-26 DIAGNOSIS — Z79899 Other long term (current) drug therapy: Secondary | ICD-10-CM | POA: Diagnosis not present

## 2014-11-26 DIAGNOSIS — I10 Essential (primary) hypertension: Secondary | ICD-10-CM | POA: Diagnosis not present

## 2014-11-26 DIAGNOSIS — R262 Difficulty in walking, not elsewhere classified: Secondary | ICD-10-CM | POA: Diagnosis not present

## 2014-11-26 DIAGNOSIS — E039 Hypothyroidism, unspecified: Secondary | ICD-10-CM | POA: Diagnosis not present

## 2014-11-26 DIAGNOSIS — M549 Dorsalgia, unspecified: Secondary | ICD-10-CM | POA: Diagnosis not present

## 2014-11-26 DIAGNOSIS — F70 Mild intellectual disabilities: Secondary | ICD-10-CM | POA: Diagnosis not present

## 2014-11-26 DIAGNOSIS — F79 Unspecified intellectual disabilities: Secondary | ICD-10-CM | POA: Diagnosis not present

## 2014-11-26 DIAGNOSIS — R400 Somnolence: Secondary | ICD-10-CM | POA: Diagnosis not present

## 2014-11-26 DIAGNOSIS — R279 Unspecified lack of coordination: Secondary | ICD-10-CM | POA: Diagnosis not present

## 2014-11-26 DIAGNOSIS — D649 Anemia, unspecified: Secondary | ICD-10-CM | POA: Diagnosis not present

## 2014-11-26 DIAGNOSIS — R2689 Other abnormalities of gait and mobility: Secondary | ICD-10-CM | POA: Diagnosis not present

## 2014-11-26 DIAGNOSIS — Z7401 Bed confinement status: Secondary | ICD-10-CM | POA: Diagnosis not present

## 2014-11-26 DIAGNOSIS — R269 Unspecified abnormalities of gait and mobility: Secondary | ICD-10-CM | POA: Diagnosis not present

## 2014-11-26 DIAGNOSIS — R569 Unspecified convulsions: Secondary | ICD-10-CM | POA: Diagnosis not present

## 2014-11-26 DIAGNOSIS — G40001 Localization-related (focal) (partial) idiopathic epilepsy and epileptic syndromes with seizures of localized onset, not intractable, with status epilepticus: Secondary | ICD-10-CM | POA: Diagnosis not present

## 2014-11-26 DIAGNOSIS — E785 Hyperlipidemia, unspecified: Secondary | ICD-10-CM | POA: Diagnosis not present

## 2014-11-26 DIAGNOSIS — M6281 Muscle weakness (generalized): Secondary | ICD-10-CM | POA: Diagnosis not present

## 2014-11-26 MED ORDER — LAMOTRIGINE 25 MG PO TABS
50.0000 mg | ORAL_TABLET | Freq: Two times a day (BID) | ORAL | Status: DC
  Filled 2014-11-26 (×4): qty 2

## 2014-11-26 MED ORDER — LAMOTRIGINE 25 MG PO TABS: 50.0000 mg | ORAL_TABLET | Freq: Two times a day (BID) | ORAL | Status: DC

## 2014-11-26 NOTE — Discharge Summary (Signed)
Christine Pena, is a 59 y.o. female  DOB [birth year]  MRN [id number].  Admission date:  11/24/2014  Admitting Physician  Wilson SingerNimish C Gosrani, MD  Discharge Date:  11/26/2014   Primary MD  Christine RibasGOLDING, [name], MD  Recommendations for primary care physician for things to follow:   Follow clinically , must follow with Neurology in 1-2 weeks   Admission Diagnosis  Altered mental status [R41.82]   Discharge Diagnosis  Altered mental status [R41.82]    Principal Problem:   Seizure Active Problems:   Mental retardation   Altered mental status   Essential hypertension      Past Medical History  Diagnosis Date  . Hypertension   . Seizures   . Mental retardation     History reviewed. No pertinent past surgical history.     History of present illness and  Hospital Course:     Kindly see H&P for history of present illness and admission details, please review complete Labs, Consult reports and Test reports for all details in brief  HPI  from the history and physical done on the day of admission   This is a 59 year old lady, who has mental retardation and seizures, who presented apparently with seizures from the group home that she lives that but then was unable to walk. She apparently has not been ambulating for the last 24-36 hours. She does take several anticonvulsant medications. The patient herself, due to the mental retardation, is unable to give me any history. The patient also has hypertension. She is now being admitted for further investigation.   Hospital Course    1. Persistent seizures with decreased mental status in the postictal phase upon admission. Seizures resolved now and mentation is back to baseline, she is on 3 antiseizure medications, phenobarb levels are stable, pending Keppra levels, CT  head and MRI brain both are nonacute. She was seen by Neurology today, per Neuro double Lamictal dose and discharged to SNF vs group home with outpatient follow-up with neurology in 1-2 weeks. Follow seizure precautions and feeding assistance with aspiration precautions.    2.MR - supportive care, SNF vs group home upon DC, PT and S work were consulted.    3.HTN - stable off Meds    4. Dyslipidemia. Continue statin at home dose.    5. Hypokalemia. Replaced.Recheck BMP in 1-2 days.     Discharge Condition: Stable   Follow UP  Follow-up Information    Follow up with Christine RibasGOLDING, [name], MD. Schedule an appointment as soon as possible for a visit in 1 week.   Specialty:  Family Medicine   Contact information:   [address] [place] [address] [phone]       Follow up with [facility] Corporation[facility]ONQUAH, KOFI, MD. Schedule an appointment as soon as possible for a visit in 1 week.   Specialty:  Neurology   Contact information:   [address] [place] [address] [phone]         Discharge Instructions  and  Discharge Medications  Discharge Instructions    Diet - low sodium heart healthy    Complete by:  As directed      Discharge instructions    Complete by:  As directed   Follow with Primary MD Christine Ribas, MD in 7 days   Get CBC, CMP, 2 view Chest X ray checked  by Primary MD next visit.    Activity: As tolerated with Full fall precautions use walker/cane & assistance as needed   Disposition SNF   Diet: Heart Healthy  with feeding assistance and aspiration precautions as needed.  For Heart failure patients - Check your Weight same time everyday, if you gain over 2 pounds, or you develop in leg swelling, experience more shortness of breath or chest pain, call your Primary MD immediately. Follow Cardiac Low Salt Diet and 1.5 lit/day fluid restriction.   On your next visit with your primary care physician please Get Medicines  reviewed and adjusted.   Please request your Prim.MD to go over all Hospital Tests and Procedure/Radiological results at the follow up, please get all Hospital records sent to your Prim MD by signing hospital release before you go home.   If you experience worsening of your admission symptoms, develop shortness of breath, life threatening emergency, suicidal or homicidal thoughts you must seek medical attention immediately by calling 911 or calling your MD immediately  if symptoms less severe.  You Must read complete instructions/literature along with all the possible adverse reactions/side effects for all the Medicines you take and that have been prescribed to you. Take any new Medicines after you have completely understood and accpet all the possible adverse reactions/side effects.   Do not drive, operating heavy machinery, perform activities at heights, swimming or participation in water activities or provide baby sitting services if your were admitted for syncope or siezures until you have seen by Primary MD or a Neurologist and advised to do so again.  Do not drive when taking Pain medications.    Do not take more than prescribed Pain, Sleep and Anxiety Medications  Special Instructions: If you have smoked or chewed Tobacco  in the last 2 yrs please stop smoking, stop any regular Alcohol  and or any Recreational drug use.  Wear Seat belts while driving.   Please note  You were cared for by a hospitalist during your hospital stay. If you have any questions about your discharge medications or the care you received while you were in the hospital after you are discharged, you can call the unit and asked to speak with the hospitalist on call if the hospitalist that took care of you is not available. Once you are discharged, your primary care physician will handle any further medical issues. Please note that NO REFILLS for any discharge medications will be authorized once you are discharged,  as it is imperative that you return to your primary care physician (or establish a relationship with a primary care physician if you do not have one) for your aftercare needs so that they can reassess your need for medications and monitor your lab values.     Increase activity slowly    Complete by:  As directed             Medication List    TAKE these medications        chlorhexidine 0.12 % solution  Commonly known as:  PERIDEX  Use as directed 10 mLs in the mouth or throat 4 (four) times daily.  hydrocortisone 2.5 % rectal cream  Commonly known as:  ANUSOL-HC  Place 1 application rectally 2 (two) times daily.     lamoTRIgine 25 MG tablet  Commonly known as:  LAMICTAL  Take 2 tablets (50 mg total) by mouth 2 (two) times daily.     levETIRAcetam 500 MG tablet  Commonly known as:  KEPPRA  Take 1 tablet (500 mg total) by mouth 2 (two) times daily.     moexipril 7.5 MG tablet  Commonly known as:  UNIVASC  Take 7.5 mg by mouth daily.     oxybutynin 5 MG tablet  Commonly known as:  DITROPAN  Take 5 mg by mouth daily.     paliperidone 6 MG 24 hr tablet  Commonly known as:  INVEGA  Take 6 mg by mouth daily.     PHENobarbital 64.8 MG tablet  Commonly known as:  LUMINAL  Take 2 tablets (129.6 mg total) by mouth at bedtime.     QUEtiapine 50 MG tablet  Commonly known as:  SEROQUEL  Take 1 tablet (50 mg total) by mouth at bedtime.     simvastatin 20 MG tablet  Commonly known as:  ZOCOR  Take 20 mg by mouth daily.     Vitamin D (Ergocalciferol) 50000 UNITS Caps capsule  Commonly known as:  DRISDOL  Take 50,000 Units by mouth every 7 (seven) days. Takes on Saturdays.     vitamin E 200 UNIT capsule  Take 200 Units by mouth daily.          Diet and Activity recommendation: See Discharge Instructions above   Consults obtained - Neurology   Major procedures and Radiology Reports - PLEASE review detailed and final reports for all details, in brief -        Ct Head Wo Contrast  11/24/2014   CLINICAL DATA:  Altered mental status, seizures  EXAM: CT HEAD WITHOUT CONTRAST  TECHNIQUE: Contiguous axial images were obtained from the base of the skull through the vertex without intravenous contrast.  COMPARISON:  10/08/2014  FINDINGS: No skull fracture is noted. Paranasal sinuses and mastoid air cells are unremarkable. No intracranial hemorrhage, mass effect or midline shift. Marked cerebellar atrophy is stable.  No acute cortical infarction. Ventricular size is stable from prior exam. No mass lesion is noted on this unenhanced scan. No intra or extra-axial fluid collection.  IMPRESSION: No acute intracranial abnormality. Stable marked cerebellar atrophy.   Electronically Signed   By: Natasha Mead M.D.   On: 11/24/2014 15:43   Mr Brain Wo Contrast  11/24/2014   CLINICAL DATA:  Altered mental status and seizures.  EXAM: MRI HEAD WITHOUT CONTRAST  TECHNIQUE: Multiplanar, multiecho pulse sequences of the brain and surrounding structures were obtained without intravenous contrast.  COMPARISON:  CT head without contrast from the same day. Previous CT of the head dating back to 05/02/2014  FINDINGS: Moderate cerebellar atrophy is again noted. The supratentorial brain is normal in appearance. No acute infarct, hemorrhage, or mass lesion is present. The ventricles are of normal size. No significant extraaxial fluid collection is present.  Flow is present in the major intracranial arteries. The globes and orbits are intact. Minimal mucosal thickening is present in the ethmoid air cells, inferior right frontal sinus, in the right maxillary sinus. The sphenoid sinuses are clear. There is some fluid in the mastoid air cells bilaterally. No obstructing nasopharyngeal lesion is evident.  IMPRESSION: 1. Moderate cerebellar atrophy. This is nonspecific, but can be seen in the setting  of chronic anti epileptic therapy. 2. No acute or focal abnormality to explain seizures or altered  mental status.   Electronically Signed   By: Marin Roberts M.D.   On: 11/24/2014 19:45   Dg Chest Port 1 View  11/24/2014   CLINICAL DATA:  Seizures and lethargy  EXAM: PORTABLE CHEST - 1 VIEW  COMPARISON:  October 08, 2014  FINDINGS: There is no edema or consolidation. Heart is upper normal in size with pulmonary vascularity within normal limits. No adenopathy. No bone lesions.  IMPRESSION: No edema or consolidation.   Electronically Signed   By: Bretta Bang III M.D.   On: 11/24/2014 15:10    Micro Results      Recent Results (from the past 240 hour(s))  Urine culture     Status: None   Collection Time: 11/24/14  3:20 PM  Result Value Ref Range Status   Specimen Description URINE, CATHETERIZED  Final   Special Requests NONE  Final   Colony Count NO GROWTH Performed at [facility]   Final   Culture NO GROWTH Performed at [facility]   Final   Report Status 11/25/2014 FINAL  Final  Culture, blood (routine x 2)     Status: None (Preliminary result)   Collection Time: 11/24/14  3:58 PM  Result Value Ref Range Status   Specimen Description BLOOD RIGHT ANTECUBITAL  Final   Special Requests BOTTLES DRAWN AEROBIC AND ANAEROBIC 6CC  Final   Culture NO GROWTH 2 DAYS  Final   Report Status PENDING  Incomplete  Culture, blood (routine x 2)     Status: None (Preliminary result)   Collection Time: 11/24/14  4:05 PM  Result Value Ref Range Status   Specimen Description BLOOD RIGHT ARM  Final   Special Requests BOTTLES DRAWN AEROBIC AND ANAEROBIC 5CC  Final   Culture NO GROWTH 2 DAYS  Final   Report Status PENDING  Incomplete  MRSA PCR Screening     Status: Abnormal   Collection Time: 11/24/14 11:01 PM  Result Value Ref Range Status   MRSA by PCR POSITIVE (A) NEGATIVE Final    Comment:        The GeneXpert MRSA Assay (FDA approved for NASAL specimens only), is one component of a comprehensive MRSA colonization surveillance program. It is not intended to  diagnose MRSA infection nor to guide or monitor treatment for MRSA infections. RESULT CALLED TO, READ BACK BY AND VERIFIED WITH: RODGERS S AT 0105 ON [id number] BY FORSYTH K        Today   Subjective:   Talaya Lamprecht today has no headache,no chest abdominal pain,no new weakness tingling or numbness, feels much better .  Objective:   Blood pressure 141/66, pulse 67, temperature 98.5 F (36.9 C), temperature source Oral, resp. rate 18, height 5\' 5"  (1.651 m), weight 88.6 kg (195 lb 5.2 oz), SpO2 99 %.   Intake/Output Summary (Last 24 hours) at 11/26/14 1456 Last data filed at 11/26/14 0923  Gross per 24 hour  Intake   1725 ml  Output      0 ml  Net   1725 ml    Exam Awake, No new F.N deficits, Normal affect [place].AT,PERRAL Supple Neck,No JVD, No cervical lymphadenopathy appriciated.  Symmetrical Chest wall movement, Good air movement bilaterally, CTAB RRR,No Gallops,Rubs or new Murmurs, No Parasternal Heave +ve B.Sounds, Abd Soft, Non tender, No organomegaly appriciated, No rebound -guarding or rigidity. No Cyanosis, Clubbing or edema, No new Rash or  bruise  Data Review   CBC w Diff:  Lab Results  Component Value Date   WBC 4.5 11/25/2014   HGB 11.4* 11/25/2014   HCT 34.1* 11/25/2014   PLT 258 11/25/2014   LYMPHOPCT 30 11/24/2014   MONOPCT 7 11/24/2014   EOSPCT 0 11/24/2014   BASOPCT 0 11/24/2014    CMP:  Lab Results  Component Value Date   NA 141 11/25/2014   K 3.7 11/25/2014   CL 109 11/25/2014   CO2 26 11/25/2014   BUN 5* 11/25/2014   CREATININE 0.52 11/25/2014   PROT 6.1 11/25/2014   ALBUMIN 3.5 11/25/2014   BILITOT 0.4 11/25/2014   ALKPHOS 47 11/25/2014   AST 10 11/25/2014   ALT 11 11/25/2014  .   Total Time in preparing paper work, data evaluation and todays exam - 35 minutes  Leroy Sea M.D on 11/26/2014 at 2:56 PM  [facility]   Office  [phone]

## 2014-11-26 NOTE — Progress Notes (Signed)
Patient Demographics  Christine Pena, is a 59 y.o. female, DOB - 29-Jun-1956, ZHY:[id number]  Admit date - 11/24/2014   Admitting Physician Wilson Singer, MD  Outpatient Primary MD for the patient is Colette Ribas, MD  LOS - 2   Chief Complaint  Patient presents with  . Seizures        Subjective:   Christine Pena today has, No headache, No chest pain, No abdominal pain - No Nausea, No new weakness tingling or numbness, No Cough - SOB.    Assessment & Plan    1. Persistent seizures with decreased mental status in the postictal phase upon admission. Seizures resolved now and mentation is back to baseline, she is on 3 antiseizure medications, phenobarb levels are stable, have ordered Keppra levels each are pending, continue Lamictal. IV Ativan for breakthrough seizure when necessary, CT head and MRI brain both are nonacute.   We await neurology input. Will likely to be placed to SNF versus group home where she was previously staying. Social work following.     2.MR - supportive care, SNF vs group home upon DC, PT and S work consulted.    3.HTN - stable off Meds   4. Dyslipidemia. Continue statin at home dose.    5. Hypokalemia. Replaced.      Code Status: Full  Family Communication: none present  Disposition Plan: group home   Procedures CT Head, MRI Brain - both nonacute   Consults  Neuro   Medications  Scheduled Meds: . chlorhexidine  10 mL Mouth/Throat QID  . Chlorhexidine Gluconate Cloth  6 each Topical Q0600  . heparin  5,000 Units Subcutaneous 3 times per day  . hydrocortisone  1 application Rectal BID  . lamoTRIgine  25 mg Oral BID  . levETIRAcetam  500 mg Oral BID  . mupirocin ointment  1 application Nasal BID  . oxybutynin  5 mg Oral Daily  .  PHENobarbital  129.6 mg Oral QHS  . simvastatin  20 mg Oral q1800  . trandolapril  1 mg Oral Daily  . Vitamin D (Ergocalciferol)  50,000 Units Oral Q7 days  . vitamin E  200 Units Oral Daily   Continuous Infusions:   PRN Meds:.LORazepam, ondansetron **OR** ondansetron (ZOFRAN) IV  DVT Prophylaxis   Heparin   Lab Results  Component Value Date   PLT 258 11/25/2014    Antibiotics     Anti-infectives    None          Objective:   Filed Vitals:   11/25/14 0630 11/25/14 1500 11/25/14 2138 11/26/14 0621  BP: 134/74 137/75 135/78 132/72  Pulse: 71 75 77 59  Temp: 98.4 F (36.9 C) 98 F (36.7 C) 98.6 F (37 C) 98.3 F (36.8 C)  TempSrc: Oral  Oral Oral  Resp: Height:      Weight:      SpO2: 100% 100% 97% 98%    Wt Readings from Last 3 Encounters:  11/24/14 88.6 kg (195 lb 5.2 oz)  11/24/14 0.907 kg (2 lb)  10/08/14 92 kg (202 lb 13.2 oz)     Intake/Output Summary (Last 24 hours) at 11/26/14 0850 Last data filed at 11/26/14 0550  Gross per 24 hour  Intake   1965 ml  Output      0 ml  Net   1965 ml     Physical Exam  Awake , Alert, following basic commands, No new F.N deficits, Flat affect [place].AT,PERRAL Supple Neck,No JVD, No cervical lymphadenopathy appriciated.  Symmetrical Chest wall movement, Good air movement bilaterally, CTAB RRR,No Gallops,Rubs or new Murmurs, No Parasternal Heave +ve B.Sounds, Abd Soft, No tenderness, No organomegaly appriciated, No rebound - guarding or rigidity. No Cyanosis, Clubbing or edema, No new Rash or bruise     Data Review   Micro Results Recent Results (from the past 240 hour(s))  Urine culture     Status: None   Collection Time: 11/24/14  3:20 PM  Result Value Ref Range Status   Specimen Description URINE, CATHETERIZED  Final   Special Requests NONE  Final   Colony Count NO GROWTH Performed at [facility]   Final   Culture NO GROWTH Performed at [facility]   Final   Report  Status 11/25/2014 FINAL  Final  Culture, blood (routine x 2)     Status: None (Preliminary result)   Collection Time: 11/24/14  3:58 PM  Result Value Ref Range Status   Specimen Description BLOOD RIGHT ANTECUBITAL  Final   Special Requests BOTTLES DRAWN AEROBIC AND ANAEROBIC 6CC  Final   Culture NO GROWTH 1 DAY  Final   Report Status PENDING  Incomplete  Culture, blood (routine x 2)     Status: None (Preliminary result)   Collection Time: 11/24/14  4:05 PM  Result Value Ref Range Status   Specimen Description BLOOD RIGHT ARM  Final   Special Requests BOTTLES DRAWN AEROBIC AND ANAEROBIC 5CC  Final   Culture NO GROWTH 1 DAY  Final   Report Status PENDING  Incomplete  MRSA PCR Screening     Status: Abnormal   Collection Time: 11/24/14 11:01 PM  Result Value Ref Range Status   MRSA by PCR POSITIVE (A) NEGATIVE Final    Comment:        The GeneXpert MRSA Assay (FDA approved for NASAL specimens only), is one component of a comprehensive MRSA colonization surveillance program. It is not intended to diagnose MRSA infection nor to guide or monitor treatment for MRSA infections. RESULT CALLED TO, READ BACK BY AND VERIFIED WITH: RODGERS S AT 0105 ON [id number] BY FORSYTH K     Radiology Reports Ct Head Wo Contrast  11/24/2014   CLINICAL DATA:  Altered mental status, seizures  EXAM: CT HEAD WITHOUT CONTRAST  TECHNIQUE: Contiguous axial images were obtained from the base of the skull through the vertex without intravenous contrast.  COMPARISON:  10/08/2014  FINDINGS: No skull fracture is noted. Paranasal sinuses and mastoid air cells are unremarkable. No intracranial hemorrhage, mass effect or midline shift. Marked cerebellar atrophy is stable.  No acute cortical infarction. Ventricular size is stable from prior exam. No mass lesion is noted on this unenhanced scan. No intra or extra-axial fluid collection.  IMPRESSION: No acute intracranial abnormality. Stable marked cerebellar atrophy.    Electronically Signed   By: Natasha MeadLiviu  Pop M.D.   On: 11/24/2014 15:43   Mr Brain Wo Contrast  11/24/2014   CLINICAL DATA:  Altered mental status and seizures.  EXAM: MRI HEAD WITHOUT CONTRAST  TECHNIQUE: Multiplanar, multiecho pulse sequences of the brain and surrounding structures were obtained without intravenous contrast.  COMPARISON:  CT head without contrast from the same day. Previous CT of the head dating back  to 05/02/2014  FINDINGS: Moderate cerebellar atrophy is again noted. The supratentorial brain is normal in appearance. No acute infarct, hemorrhage, or mass lesion is present. The ventricles are of normal size. No significant extraaxial fluid collection is present.  Flow is present in the major intracranial arteries. The globes and orbits are intact. Minimal mucosal thickening is present in the ethmoid air cells, inferior right frontal sinus, in the right maxillary sinus. The sphenoid sinuses are clear. There is some fluid in the mastoid air cells bilaterally. No obstructing nasopharyngeal lesion is evident.  IMPRESSION: 1. Moderate cerebellar atrophy. This is nonspecific, but can be seen in the setting of chronic anti epileptic therapy. 2. No acute or focal abnormality to explain seizures or altered mental status.   Electronically Signed   By: Marin Roberts M.D.   On: 11/24/2014 19:45   Dg Chest Port 1 View  11/24/2014   CLINICAL DATA:  Seizures and lethargy  EXAM: PORTABLE CHEST - 1 VIEW  COMPARISON:  October 08, 2014  FINDINGS: There is no edema or consolidation. Heart is upper normal in size with pulmonary vascularity within normal limits. No adenopathy. No bone lesions.  IMPRESSION: No edema or consolidation.   Electronically Signed   By: Bretta Bang III M.D.   On: 11/24/2014 15:10     CBC  Recent Labs Lab 11/24/14 1401 11/25/14 0658  WBC 7.4 4.5  HGB 12.6 11.4*  HCT 37.3 34.1*  PLT 341 258  MCV 86.9 86.8  MCH 29.4 29.0  MCHC 33.8 33.4  RDW 12.4 12.3  LYMPHSABS 2.2   --   MONOABS 0.5  --   EOSABS 0.0  --   BASOSABS 0.0  --     Chemistries   Recent Labs Lab 11/24/14 1401 11/25/14 0658  NA 140 141  K 3.4* 3.7  CL 104 109  CO2 27 26  GLUCOSE 106* 90  BUN 8 5*  CREATININE 0.59 0.52  CALCIUM 9.6 8.9  AST 17 10  ALT 12 11  ALKPHOS 56 47  BILITOT 0.3 0.4   ------------------------------------------------------------------------------------------------------------------ estimated creatinine clearance is 83.2 mL/min (by C-G formula based on Cr of 0.52). ------------------------------------------------------------------------------------------------------------------ No results for input(s): HGBA1C in the last 72 hours. ------------------------------------------------------------------------------------------------------------------ No results for input(s): CHOL, HDL, LDLCALC, TRIG, CHOLHDL, LDLDIRECT in the last 72 hours. ------------------------------------------------------------------------------------------------------------------ No results for input(s): TSH, T4TOTAL, T3FREE, THYROIDAB in the last 72 hours.  Invalid input(s): FREET3 ------------------------------------------------------------------------------------------------------------------ No results for input(s): VITAMINB12, FOLATE, FERRITIN, TIBC, IRON, RETICCTPCT in the last 72 hours.  Coagulation profile No results for input(s): INR, PROTIME in the last 168 hours.  No results for input(s): DDIMER in the last 72 hours.  Cardiac Enzymes  Recent Labs Lab 11/25/14 0804  TROPONINI <0.03   ------------------------------------------------------------------------------------------------------------------ Invalid input(s): POCBNP     Time Spent in minutes   35   SINGH,PRASHANT K M.D on 11/26/2014 at 8:50 AM  Between 7am to 7pm - Pager - [phone]  After 7pm go to [email or web address] - password [facility]  [facility]   Office  [phone]

## 2014-11-26 NOTE — Discharge Planning (Signed)
Client ready to be DC to [facility]. VSS and no pain communicated or noted.  IV removed. Packet created and will  Be sent with EMS. Report called to [facility] [place], RN- patient going to rm C28.  Client being transported via EMS D/T inability to stand and transfer safely to wheelchair or vehicle. EMS called and awaiting arrival.

## 2014-11-26 NOTE — Consult Note (Signed)
Christine A. Merlene Laughter, MD     [email or web address]          Christine Pena is an 59 y.o. female.   ASSESSMENT/PLAN: 1. Multiple seizures causing altered mentation due to breakthrough on therapy medications for epilepsy. 2. Poorly controlled epilepsy. 3. Mental retardation at baseline. 4. Gait impairment due to multiple seizures.  RECOMMENDATION Increase Lamictal to 50 mg twice a day. Continue with Keppra and phenobarbital at the same doses. However, I think we should consider reduce the dose of phenobarbital. This has been done extremely slowly to avoid phenobarbital withdrawal seizures which can be quite severe. We may consider this in the outpatient setting.  The patient is a 59 year old white female who has a baseline history of epilepsy. The patient was admitted to the hospital middle of last year 2015 with the multiple seizures-status epilepticus after she was taken off Depakote. She was taken off Depakote because of altered mental status and elevated ammonia level. She was placed on Keppra at that time. It appears that she is also was started on the Lamictal sometime after this. She currently remains on low dose Lamictal. The patient presented to the hospital a few months later with altered mentation. This was in January 2016. Reasons were not ever quite clear but there is suspicion that the patient had a seizure or a few seizures causing the altered mental status and that she also had gait impairment at that time. Because of the issue of polypharmacy, I did reduce her phenobarbital from a regimen of 130 mg nightly alternating with 198 mg every other night. She was reduced on 130 milligrams daily and nighttime. Appears that she has had more seizures over the last several weeks associated with increasing confusion and gait impairment especially over the last few days. The review of systems is difficult because of the baseline confusion but seems otherwise  unremarkable.  GENERAL: She is in no acute distress.  HEENT: Supple. Atraumatic normocephalic.   ABDOMEN: soft  EXTREMITIES: No edema   BACK: Normal.  SKIN: Normal by inspection.    MENTAL STATUS: She is awake and alert. She is repeating incessantly that she is unable to walk and that she is paralyzed. She also stated that her mother is coming to help her walk. She follows commands both axially and appendicularly with some prompting. She is disoriented and doesn't say much other than stated above.   CRANIAL NERVES: Pupils are equal, round and reactive to light and accommodation; extra ocular movements are full, there is no significant nystagmus; visual fields are full; upper and lower facial muscles are normal in strength and symmetric, there is no flattening of the nasolabial folds; tongue is midline; uvula is midline; shoulder elevation is normal.  MOTOR: Mild global hypotonia. Strength appears to be normal at least 4/5 throughout.  COORDINATION: She appears to have some tremors of the legs mostly due to anxiety. No tremors or shaking of the upper extremities. No dysmetria noted. No cogwheeling or rigidity.  REFLEXES: Deep tendon reflexes are symmetrical and normal. Babinski reflexes are flexor bilaterally.    Blood pressure 132/72, pulse 59, temperature 98.3 F (36.8 C), temperature source Oral, resp. rate 18, height '5\' 5"'  (1.651 m), weight 88.6 kg (195 lb 5.2 oz), SpO2 98 %.  Past Medical History  Diagnosis Date  . Hypertension   . Seizures   . Mental retardation     History reviewed. No pertinent past surgical history.  Family History  Problem Relation Age of Onset  .  Heart disease Mother     Social History:  reports that she has never smoked. She has never used smokeless tobacco. She reports that she does not drink alcohol or use illicit drugs.  Allergies:  Allergies  Allergen Reactions  . Depakote [Valproic Acid]     altered mental status/ high ammonia  .  Haldol [Haloperidol Lactate]     Unknown reaction.    Medications: Prior to Admission medications   Medication Sig Start Date End Date Taking? Authorizing Provider  chlorhexidine (PERIDEX) 0.12 % solution Use as directed 10 mLs in the mouth or throat 4 (four) times daily.   Yes Historical Provider, MD  hydrocortisone (ANUSOL-HC) 2.5 % rectal cream Place 1 application rectally 2 (two) times daily.   Yes Historical Provider, MD  lamoTRIgine (LAMICTAL) 25 MG tablet Take 2 tablets (50 mg total) by mouth 2 (two) times daily. Patient taking differently: Take 25 mg by mouth 2 (two) times daily.  10/11/14  Yes Lezlie Octave Black, NP  levETIRAcetam (KEPPRA) 500 MG tablet Take 1 tablet (500 mg total) by mouth 2 (two) times daily. 05/05/14  Yes Lezlie Octave Black, NP  moexipril (UNIVASC) 7.5 MG tablet Take 7.5 mg by mouth daily.  12/30/13  Yes Historical Provider, MD  oxybutynin (DITROPAN) 5 MG tablet Take 5 mg by mouth daily.  12/30/13  Yes Historical Provider, MD  paliperidone (INVEGA) 6 MG 24 hr tablet Take 6 mg by mouth daily.   Yes Historical Provider, MD  PHENobarbital (LUMINAL) 64.8 MG tablet Take 2 tablets (129.6 mg total) by mouth at bedtime. Patient taking differently: Take 129.6 mg by mouth at bedtime.  10/11/14  Yes Lezlie Octave Black, NP  simvastatin (ZOCOR) 20 MG tablet Take 20 mg by mouth daily.  12/30/13  Yes Historical Provider, MD  Vitamin D, Ergocalciferol, (DRISDOL) 50000 UNITS CAPS capsule Take 50,000 Units by mouth every 7 (seven) days. Takes on Saturdays.   Yes Historical Provider, MD  vitamin E 200 UNIT capsule Take 200 Units by mouth daily.   Yes Historical Provider, MD  QUEtiapine (SEROQUEL) 50 MG tablet Take 1 tablet (50 mg total) by mouth at bedtime. 10/11/14   Radene Gunning, NP    Scheduled Meds: . chlorhexidine  10 mL Mouth/Throat QID  . Chlorhexidine Gluconate Cloth  6 each Topical Q0600  . heparin  5,000 Units Subcutaneous 3 times per day  . hydrocortisone  1 application Rectal BID  .  lamoTRIgine  25 mg Oral BID  . levETIRAcetam  500 mg Oral BID  . mupirocin ointment  1 application Nasal BID  . oxybutynin  5 mg Oral Daily  . PHENobarbital  129.6 mg Oral QHS  . simvastatin  20 mg Oral q1800  . trandolapril  1 mg Oral Daily  . Vitamin D (Ergocalciferol)  50,000 Units Oral Q7 days  . vitamin E  200 Units Oral Daily   Continuous Infusions:  PRN Meds:.LORazepam, ondansetron **OR** ondansetron (ZOFRAN) IV     Results for orders placed or performed during the hospital encounter of 11/24/14 (from the past 48 hour(s))  Urine culture     Status: None   Collection Time: 11/24/14  3:20 PM  Result Value Ref Range   Specimen Description URINE, CATHETERIZED    Special Requests NONE    Colony Count NO GROWTH Performed at [place] Performed at [facility]     Report Status 11/25/2014 FINAL   Urinalysis, Routine w reflex microscopic  Status: Abnormal   Collection Time: 11/24/14  3:20 PM  Result Value Ref Range   Color, Urine YELLOW YELLOW   APPearance CLEAR CLEAR   Specific Gravity, Urine <1.005 (L) 1.005 - 1.030   pH 5.5 5.0 - 8.0   Glucose, UA NEGATIVE NEGATIVE mg/dL   Hgb urine dipstick TRACE (A) NEGATIVE   Bilirubin Urine NEGATIVE NEGATIVE   Ketones, ur NEGATIVE NEGATIVE mg/dL   Protein, ur NEGATIVE NEGATIVE mg/dL   Urobilinogen, UA 0.2 0.0 - 1.0 mg/dL   Nitrite NEGATIVE NEGATIVE   Leukocytes, UA NEGATIVE NEGATIVE  Urine microscopic-add on     Status: Abnormal   Collection Time: 11/24/14  3:20 PM  Result Value Ref Range   Squamous Epithelial / LPF MANY (A) RARE   WBC, UA 0-2 <3 WBC/hpf   RBC / HPF 3-6 <3 RBC/hpf   Bacteria, UA FEW (A) RARE  I-Stat CG4 Lactic Acid, ED     Status: None   Collection Time: 11/24/14  3:33 PM  Result Value Ref Range   Lactic Acid, Venous 0.51 0.5 - 2.0 mmol/L  Culture, blood (routine x 2)     Status: None (Preliminary result)   Collection Time: 11/24/14  3:58 PM  Result Value Ref  Range   Specimen Description BLOOD RIGHT ANTECUBITAL    Special Requests BOTTLES DRAWN AEROBIC AND ANAEROBIC 6CC    Culture NO GROWTH 2 DAYS    Report Status PENDING   Culture, blood (routine x 2)     Status: None (Preliminary result)   Collection Time: 11/24/14  4:05 PM  Result Value Ref Range   Specimen Description BLOOD RIGHT ARM    Special Requests BOTTLES DRAWN AEROBIC AND ANAEROBIC 5CC    Culture NO GROWTH 2 DAYS    Report Status PENDING   MRSA PCR Screening     Status: Abnormal   Collection Time: 11/24/14 11:01 PM  Result Value Ref Range   MRSA by PCR POSITIVE (A) NEGATIVE    Comment:        The GeneXpert MRSA Assay (FDA approved for NASAL specimens only), is one component of a comprehensive MRSA colonization surveillance program. It is not intended to diagnose MRSA infection nor to guide or monitor treatment for MRSA infections. RESULT CALLED TO, READ BACK BY AND VERIFIED WITH: RODGERS S AT 0105 ON [id number] BY FORSYTH K   Comprehensive metabolic panel     Status: Abnormal   Collection Time: 11/25/14  6:58 AM  Result Value Ref Range   Sodium 141 135 - 145 mmol/L   Potassium 3.7 3.5 - 5.1 mmol/L   Chloride 109 96 - 112 mmol/L   CO2 26 19 - 32 mmol/L   Glucose, Bld 90 70 - 99 mg/dL   BUN 5 (L) 6 - 23 mg/dL   Creatinine, Ser 0.52 0.50 - 1.10 mg/dL   Calcium 8.9 8.4 - 10.5 mg/dL   Total Protein 6.1 6.0 - 8.3 g/dL   Albumin 3.5 3.5 - 5.2 g/dL   AST 10 0 - 37 U/L   ALT 11 0 - 35 U/L   Alkaline Phosphatase 47 39 - 117 U/L   Total Bilirubin 0.4 0.3 - 1.2 mg/dL   GFR calc non Af Amer >90 >90 mL/min   GFR calc Af Amer >90 >90 mL/min    Comment: (NOTE) The eGFR has been calculated using the CKD EPI equation. This calculation has not been validated in all clinical situations. eGFR's persistently <90 mL/min signify possible Chronic Kidney Disease.  Anion gap 6 5 - 15  CBC     Status: Abnormal   Collection Time: 11/25/14  6:58 AM  Result Value Ref Range   WBC 4.5  4.0 - 10.5 K/uL   RBC 3.93 3.87 - 5.11 MIL/uL   Hemoglobin 11.4 (L) 12.0 - 15.0 g/dL   HCT 34.1 (L) 36.0 - 46.0 %   MCV 86.8 78.0 - 100.0 fL   MCH 29.0 26.0 - 34.0 pg   MCHC 33.4 30.0 - 36.0 g/dL   RDW 12.3 11.5 - 15.5 %   Platelets 258 150 - 400 K/uL    Comment: DELTA CHECK NOTED  Ammonia     Status: None   Collection Time: 11/25/14  8:04 AM  Result Value Ref Range   Ammonia 16 11 - 32 umol/L  Troponin I (q 6hr x 3)     Status: None   Collection Time: 11/25/14  8:04 AM  Result Value Ref Range   Troponin I <0.03 <0.031 ng/mL    Comment:        NO INDICATION OF MYOCARDIAL INJURY.     Studies/Results: BRAIN MRI Moderate cerebellar atrophy is again noted. The supratentorial brain is normal in appearance. No acute infarct, hemorrhage, or mass lesion is present. The ventricles are of normal size. No significant extraaxial fluid collection is present.  Flow is present in the major intracranial arteries. The globes and orbits are intact. Minimal mucosal thickening is present in the ethmoid air cells, inferior right frontal sinus, in the right maxillary sinus. The sphenoid sinuses are clear. There is some fluid in the mastoid air cells bilaterally. No obstructing nasopharyngeal lesion is evident.  IMPRESSION: 1. Moderate cerebellar atrophy. This is nonspecific, but can be seen in the setting of chronic anti epileptic therapy. 2. No acute or focal abnormality to explain seizures or altered mental status.     Tatjana Turcott A. Merlene Pena, M.D.  Diplomate, Tax adviser of Psychiatry and Neurology ( Neurology). 11/26/2014, 2:02 PM

## 2014-11-26 NOTE — Discharge Instructions (Signed)
Follow with Primary MD [name], [name], MD in 7 days   Get CBC, CMP, 2 view Chest X ray checked  by Primary MD next visit.    Activity: As tolerated with Full fall precautions use walker/cane & assistance as needed   Disposition SNF   Diet: Heart Healthy  with feeding assistance and aspiration precautions as needed.  For Heart failure patients - Check your Weight same time everyday, if you gain over 2 pounds, or you develop in leg swelling, experience more shortness of breath or chest pain, call your Primary MD immediately. Follow Cardiac Low Salt Diet and 1.5 lit/day fluid restriction.   On your next visit with your primary care physician please Get Medicines reviewed and adjusted.   Please request your Prim.MD to go over all Hospital Tests and Procedure/Radiological results at the follow up, please get all Hospital records sent to your Prim MD by signing hospital release before you go home.   If you experience worsening of your admission symptoms, develop shortness of breath, life threatening emergency, suicidal or homicidal thoughts you must seek medical attention immediately by calling 911 or calling your MD immediately  if symptoms less severe.  You Must read complete instructions/literature along with all the possible adverse reactions/side effects for all the Medicines you take and that have been prescribed to you. Take any new Medicines after you have completely understood and accpet all the possible adverse reactions/side effects.   Do not drive, operating heavy machinery, perform activities at heights, swimming or participation in water activities or provide baby sitting services if your were admitted for syncope or siezures until you have seen by Primary MD or a Neurologist and advised to do so again.  Do not drive when taking Pain medications.    Do not take more than prescribed Pain, Sleep and Anxiety Medications  Special Instructions: If you have smoked or chewed  Tobacco  in the last 2 yrs please stop smoking, stop any regular Alcohol  and or any Recreational drug use.  Wear Seat belts while driving.   Please note  You were cared for by a hospitalist during your hospital stay. If you have any questions about your discharge medications or the care you received while you were in the hospital after you are discharged, you can call the unit and asked to speak with the hospitalist on call if the hospitalist that took care of you is not available. Once you are discharged, your primary care physician will handle any further medical issues. Please note that NO REFILLS for any discharge medications will be authorized once you are discharged, as it is imperative that you return to your primary care physician (or establish a relationship with a primary care physician if you do not have one) for your aftercare needs so that they can reassess your need for medications and monitor your lab values.

## 2014-11-26 NOTE — Progress Notes (Signed)
UR completed 

## 2014-11-28 LAB — LEVETIRACETAM LEVEL: Levetiracetam Lvl: 5.5 ug/mL — ABNORMAL LOW (ref 10.0–40.0)

## 2014-11-29 DIAGNOSIS — R569 Unspecified convulsions: Secondary | ICD-10-CM | POA: Diagnosis not present

## 2014-11-29 DIAGNOSIS — M549 Dorsalgia, unspecified: Secondary | ICD-10-CM | POA: Diagnosis not present

## 2014-11-29 DIAGNOSIS — E559 Vitamin D deficiency, unspecified: Secondary | ICD-10-CM | POA: Diagnosis not present

## 2014-11-29 DIAGNOSIS — E876 Hypokalemia: Secondary | ICD-10-CM | POA: Diagnosis not present

## 2014-11-29 DIAGNOSIS — E785 Hyperlipidemia, unspecified: Secondary | ICD-10-CM | POA: Diagnosis not present

## 2014-11-29 LAB — CULTURE, BLOOD (ROUTINE X 2)
Culture: NO GROWTH
Culture: NO GROWTH

## 2014-11-30 DIAGNOSIS — M549 Dorsalgia, unspecified: Secondary | ICD-10-CM | POA: Diagnosis not present

## 2014-11-30 DIAGNOSIS — R569 Unspecified convulsions: Secondary | ICD-10-CM | POA: Diagnosis not present

## 2014-11-30 DIAGNOSIS — E559 Vitamin D deficiency, unspecified: Secondary | ICD-10-CM | POA: Diagnosis not present

## 2014-12-27 DIAGNOSIS — F419 Anxiety disorder, unspecified: Secondary | ICD-10-CM | POA: Diagnosis not present

## 2014-12-27 DIAGNOSIS — R2689 Other abnormalities of gait and mobility: Secondary | ICD-10-CM | POA: Diagnosis not present

## 2014-12-27 DIAGNOSIS — E876 Hypokalemia: Secondary | ICD-10-CM | POA: Diagnosis not present

## 2014-12-27 DIAGNOSIS — R601 Generalized edema: Secondary | ICD-10-CM | POA: Diagnosis not present

## 2014-12-27 DIAGNOSIS — R262 Difficulty in walking, not elsewhere classified: Secondary | ICD-10-CM | POA: Diagnosis not present

## 2014-12-27 DIAGNOSIS — R4182 Altered mental status, unspecified: Secondary | ICD-10-CM | POA: Diagnosis not present

## 2014-12-27 DIAGNOSIS — M6281 Muscle weakness (generalized): Secondary | ICD-10-CM | POA: Diagnosis not present

## 2014-12-27 DIAGNOSIS — I10 Essential (primary) hypertension: Secondary | ICD-10-CM | POA: Diagnosis not present

## 2014-12-28 DIAGNOSIS — R262 Difficulty in walking, not elsewhere classified: Secondary | ICD-10-CM | POA: Diagnosis not present

## 2014-12-28 DIAGNOSIS — R2689 Other abnormalities of gait and mobility: Secondary | ICD-10-CM | POA: Diagnosis not present

## 2014-12-28 DIAGNOSIS — R4182 Altered mental status, unspecified: Secondary | ICD-10-CM | POA: Diagnosis not present

## 2014-12-28 DIAGNOSIS — M6281 Muscle weakness (generalized): Secondary | ICD-10-CM | POA: Diagnosis not present

## 2014-12-29 DIAGNOSIS — M6281 Muscle weakness (generalized): Secondary | ICD-10-CM | POA: Diagnosis not present

## 2014-12-29 DIAGNOSIS — R2689 Other abnormalities of gait and mobility: Secondary | ICD-10-CM | POA: Diagnosis not present

## 2014-12-29 DIAGNOSIS — R262 Difficulty in walking, not elsewhere classified: Secondary | ICD-10-CM | POA: Diagnosis not present

## 2014-12-29 DIAGNOSIS — R4182 Altered mental status, unspecified: Secondary | ICD-10-CM | POA: Diagnosis not present

## 2014-12-30 DIAGNOSIS — R4182 Altered mental status, unspecified: Secondary | ICD-10-CM | POA: Diagnosis not present

## 2014-12-30 DIAGNOSIS — M6281 Muscle weakness (generalized): Secondary | ICD-10-CM | POA: Diagnosis not present

## 2014-12-30 DIAGNOSIS — R262 Difficulty in walking, not elsewhere classified: Secondary | ICD-10-CM | POA: Diagnosis not present

## 2014-12-30 DIAGNOSIS — R2689 Other abnormalities of gait and mobility: Secondary | ICD-10-CM | POA: Diagnosis not present

## 2014-12-31 DIAGNOSIS — R4182 Altered mental status, unspecified: Secondary | ICD-10-CM | POA: Diagnosis not present

## 2014-12-31 DIAGNOSIS — Z0189 Encounter for other specified special examinations: Secondary | ICD-10-CM | POA: Diagnosis not present

## 2014-12-31 DIAGNOSIS — D649 Anemia, unspecified: Secondary | ICD-10-CM | POA: Diagnosis not present

## 2014-12-31 DIAGNOSIS — Z79899 Other long term (current) drug therapy: Secondary | ICD-10-CM | POA: Diagnosis not present

## 2015-01-01 DIAGNOSIS — I10 Essential (primary) hypertension: Secondary | ICD-10-CM | POA: Diagnosis not present

## 2015-01-01 DIAGNOSIS — R601 Generalized edema: Secondary | ICD-10-CM | POA: Diagnosis not present

## 2015-01-01 DIAGNOSIS — E876 Hypokalemia: Secondary | ICD-10-CM | POA: Diagnosis not present

## 2015-01-02 DIAGNOSIS — R4182 Altered mental status, unspecified: Secondary | ICD-10-CM | POA: Diagnosis not present

## 2015-01-02 DIAGNOSIS — R2689 Other abnormalities of gait and mobility: Secondary | ICD-10-CM | POA: Diagnosis not present

## 2015-01-02 DIAGNOSIS — M6281 Muscle weakness (generalized): Secondary | ICD-10-CM | POA: Diagnosis not present

## 2015-01-02 DIAGNOSIS — R262 Difficulty in walking, not elsewhere classified: Secondary | ICD-10-CM | POA: Diagnosis not present

## 2015-01-03 DIAGNOSIS — I10 Essential (primary) hypertension: Secondary | ICD-10-CM | POA: Diagnosis not present

## 2015-01-03 DIAGNOSIS — F79 Unspecified intellectual disabilities: Secondary | ICD-10-CM | POA: Diagnosis not present

## 2015-01-03 DIAGNOSIS — Z79899 Other long term (current) drug therapy: Secondary | ICD-10-CM | POA: Diagnosis not present

## 2015-01-03 DIAGNOSIS — R270 Ataxia, unspecified: Secondary | ICD-10-CM | POA: Diagnosis not present

## 2015-01-03 DIAGNOSIS — R4182 Altered mental status, unspecified: Secondary | ICD-10-CM | POA: Diagnosis not present

## 2015-01-03 DIAGNOSIS — G2111 Neuroleptic induced parkinsonism: Secondary | ICD-10-CM | POA: Diagnosis not present

## 2015-01-03 DIAGNOSIS — R601 Generalized edema: Secondary | ICD-10-CM | POA: Diagnosis not present

## 2015-01-03 DIAGNOSIS — R262 Difficulty in walking, not elsewhere classified: Secondary | ICD-10-CM | POA: Diagnosis not present

## 2015-01-03 DIAGNOSIS — G40911 Epilepsy, unspecified, intractable, with status epilepticus: Secondary | ICD-10-CM | POA: Diagnosis not present

## 2015-01-03 DIAGNOSIS — M6281 Muscle weakness (generalized): Secondary | ICD-10-CM | POA: Diagnosis not present

## 2015-01-03 DIAGNOSIS — R2689 Other abnormalities of gait and mobility: Secondary | ICD-10-CM | POA: Diagnosis not present

## 2015-01-04 DIAGNOSIS — R262 Difficulty in walking, not elsewhere classified: Secondary | ICD-10-CM | POA: Diagnosis not present

## 2015-01-04 DIAGNOSIS — R4182 Altered mental status, unspecified: Secondary | ICD-10-CM | POA: Diagnosis not present

## 2015-01-04 DIAGNOSIS — R2689 Other abnormalities of gait and mobility: Secondary | ICD-10-CM | POA: Diagnosis not present

## 2015-01-04 DIAGNOSIS — M6281 Muscle weakness (generalized): Secondary | ICD-10-CM | POA: Diagnosis not present

## 2015-01-05 DIAGNOSIS — R4182 Altered mental status, unspecified: Secondary | ICD-10-CM | POA: Diagnosis not present

## 2015-01-05 DIAGNOSIS — R2689 Other abnormalities of gait and mobility: Secondary | ICD-10-CM | POA: Diagnosis not present

## 2015-01-05 DIAGNOSIS — R262 Difficulty in walking, not elsewhere classified: Secondary | ICD-10-CM | POA: Diagnosis not present

## 2015-01-05 DIAGNOSIS — M6281 Muscle weakness (generalized): Secondary | ICD-10-CM | POA: Diagnosis not present

## 2015-01-06 DIAGNOSIS — R4182 Altered mental status, unspecified: Secondary | ICD-10-CM | POA: Diagnosis not present

## 2015-01-06 DIAGNOSIS — M6281 Muscle weakness (generalized): Secondary | ICD-10-CM | POA: Diagnosis not present

## 2015-01-06 DIAGNOSIS — R262 Difficulty in walking, not elsewhere classified: Secondary | ICD-10-CM | POA: Diagnosis not present

## 2015-01-06 DIAGNOSIS — R2689 Other abnormalities of gait and mobility: Secondary | ICD-10-CM | POA: Diagnosis not present

## 2015-01-09 DIAGNOSIS — R4182 Altered mental status, unspecified: Secondary | ICD-10-CM | POA: Diagnosis not present

## 2015-01-09 DIAGNOSIS — R2689 Other abnormalities of gait and mobility: Secondary | ICD-10-CM | POA: Diagnosis not present

## 2015-01-09 DIAGNOSIS — M6281 Muscle weakness (generalized): Secondary | ICD-10-CM | POA: Diagnosis not present

## 2015-01-09 DIAGNOSIS — R262 Difficulty in walking, not elsewhere classified: Secondary | ICD-10-CM | POA: Diagnosis not present

## 2015-01-10 DIAGNOSIS — R2689 Other abnormalities of gait and mobility: Secondary | ICD-10-CM | POA: Diagnosis not present

## 2015-01-10 DIAGNOSIS — R4182 Altered mental status, unspecified: Secondary | ICD-10-CM | POA: Diagnosis not present

## 2015-01-10 DIAGNOSIS — R262 Difficulty in walking, not elsewhere classified: Secondary | ICD-10-CM | POA: Diagnosis not present

## 2015-01-10 DIAGNOSIS — M6281 Muscle weakness (generalized): Secondary | ICD-10-CM | POA: Diagnosis not present

## 2015-01-17 DIAGNOSIS — R319 Hematuria, unspecified: Secondary | ICD-10-CM | POA: Diagnosis not present

## 2015-01-17 DIAGNOSIS — F79 Unspecified intellectual disabilities: Secondary | ICD-10-CM | POA: Diagnosis not present

## 2015-01-17 DIAGNOSIS — D649 Anemia, unspecified: Secondary | ICD-10-CM | POA: Diagnosis not present

## 2015-01-17 DIAGNOSIS — R601 Generalized edema: Secondary | ICD-10-CM | POA: Diagnosis not present

## 2015-01-17 DIAGNOSIS — N390 Urinary tract infection, site not specified: Secondary | ICD-10-CM | POA: Diagnosis not present

## 2015-01-17 DIAGNOSIS — R569 Unspecified convulsions: Secondary | ICD-10-CM | POA: Diagnosis not present

## 2015-01-17 DIAGNOSIS — Z79899 Other long term (current) drug therapy: Secondary | ICD-10-CM | POA: Diagnosis not present

## 2015-01-17 DIAGNOSIS — G40001 Localization-related (focal) (partial) idiopathic epilepsy and epileptic syndromes with seizures of localized onset, not intractable, with status epilepticus: Secondary | ICD-10-CM | POA: Diagnosis not present

## 2015-01-17 DIAGNOSIS — R4182 Altered mental status, unspecified: Secondary | ICD-10-CM | POA: Diagnosis not present

## 2015-01-17 DIAGNOSIS — I10 Essential (primary) hypertension: Secondary | ICD-10-CM | POA: Diagnosis not present

## 2015-01-18 DIAGNOSIS — R569 Unspecified convulsions: Secondary | ICD-10-CM | POA: Diagnosis not present

## 2015-01-18 DIAGNOSIS — N390 Urinary tract infection, site not specified: Secondary | ICD-10-CM | POA: Diagnosis not present

## 2015-01-18 DIAGNOSIS — F79 Unspecified intellectual disabilities: Secondary | ICD-10-CM | POA: Diagnosis not present

## 2015-01-18 DIAGNOSIS — R601 Generalized edema: Secondary | ICD-10-CM | POA: Diagnosis not present

## 2015-01-18 DIAGNOSIS — I10 Essential (primary) hypertension: Secondary | ICD-10-CM | POA: Diagnosis not present

## 2015-01-19 DIAGNOSIS — F79 Unspecified intellectual disabilities: Secondary | ICD-10-CM | POA: Diagnosis not present

## 2015-01-19 DIAGNOSIS — R601 Generalized edema: Secondary | ICD-10-CM | POA: Diagnosis not present

## 2015-01-19 DIAGNOSIS — R569 Unspecified convulsions: Secondary | ICD-10-CM | POA: Diagnosis not present

## 2015-01-19 DIAGNOSIS — N390 Urinary tract infection, site not specified: Secondary | ICD-10-CM | POA: Diagnosis not present

## 2015-01-27 DIAGNOSIS — G40001 Localization-related (focal) (partial) idiopathic epilepsy and epileptic syndromes with seizures of localized onset, not intractable, with status epilepticus: Secondary | ICD-10-CM | POA: Diagnosis not present

## 2015-01-27 DIAGNOSIS — F70 Mild intellectual disabilities: Secondary | ICD-10-CM | POA: Diagnosis not present

## 2015-01-27 DIAGNOSIS — R569 Unspecified convulsions: Secondary | ICD-10-CM | POA: Diagnosis not present

## 2015-01-27 DIAGNOSIS — N390 Urinary tract infection, site not specified: Secondary | ICD-10-CM | POA: Diagnosis not present

## 2015-01-27 DIAGNOSIS — R4182 Altered mental status, unspecified: Secondary | ICD-10-CM | POA: Diagnosis not present

## 2015-01-27 DIAGNOSIS — I10 Essential (primary) hypertension: Secondary | ICD-10-CM | POA: Diagnosis not present

## 2015-01-28 ENCOUNTER — Encounter (HOSPITAL_COMMUNITY): Payer: Self-pay | Admitting: Emergency Medicine

## 2015-01-28 ENCOUNTER — Emergency Department (HOSPITAL_COMMUNITY): Payer: Medicare Other

## 2015-01-28 ENCOUNTER — Inpatient Hospital Stay (HOSPITAL_COMMUNITY)
Admission: EM | Admit: 2015-01-28 | Discharge: 2015-01-30 | DRG: 101 | Disposition: A | Discharge status: Skilled Nursing Facility | Payer: Medicare Other | Attending: Internal Medicine | Admitting: Internal Medicine

## 2015-01-28 DIAGNOSIS — I150 Renovascular hypertension: Secondary | ICD-10-CM | POA: Diagnosis not present

## 2015-01-28 DIAGNOSIS — G40901 Epilepsy, unspecified, not intractable, with status epilepticus: Principal | ICD-10-CM | POA: Diagnosis present

## 2015-01-28 DIAGNOSIS — S0083XA Contusion of other part of head, initial encounter: Secondary | ICD-10-CM | POA: Diagnosis present

## 2015-01-28 DIAGNOSIS — R569 Unspecified convulsions: Secondary | ICD-10-CM

## 2015-01-28 DIAGNOSIS — E785 Hyperlipidemia, unspecified: Secondary | ICD-10-CM | POA: Diagnosis not present

## 2015-01-28 DIAGNOSIS — S0181XA Laceration without foreign body of other part of head, initial encounter: Secondary | ICD-10-CM | POA: Diagnosis not present

## 2015-01-28 DIAGNOSIS — I10 Essential (primary) hypertension: Secondary | ICD-10-CM | POA: Diagnosis not present

## 2015-01-28 DIAGNOSIS — R51 Headache: Secondary | ICD-10-CM | POA: Diagnosis not present

## 2015-01-28 DIAGNOSIS — S199XXA Unspecified injury of neck, initial encounter: Secondary | ICD-10-CM | POA: Diagnosis not present

## 2015-01-28 DIAGNOSIS — S0990XA Unspecified injury of head, initial encounter: Secondary | ICD-10-CM | POA: Diagnosis not present

## 2015-01-28 DIAGNOSIS — W19XXXA Unspecified fall, initial encounter: Secondary | ICD-10-CM | POA: Diagnosis present

## 2015-01-28 DIAGNOSIS — F79 Unspecified intellectual disabilities: Secondary | ICD-10-CM | POA: Diagnosis present

## 2015-01-28 DIAGNOSIS — G40909 Epilepsy, unspecified, not intractable, without status epilepticus: Secondary | ICD-10-CM | POA: Diagnosis not present

## 2015-01-28 DIAGNOSIS — Z8249 Family history of ischemic heart disease and other diseases of the circulatory system: Secondary | ICD-10-CM

## 2015-01-28 DIAGNOSIS — M25432 Effusion, left wrist: Secondary | ICD-10-CM

## 2015-01-28 LAB — PHENOBARBITAL LEVEL: PHENOBARBITAL: 15.6 ug/mL (ref 15.0–40.0)

## 2015-01-28 MED ORDER — LEVETIRACETAM IN NACL 1000 MG/100ML IV SOLN
1000.0000 mg | Freq: Once | INTRAVENOUS | Status: AC
  Administered 2015-01-28: 1000 mg via INTRAVENOUS
  Filled 2015-01-28: qty 100

## 2015-01-28 MED ORDER — LAMOTRIGINE 25 MG PO TABS
50.0000 mg | ORAL_TABLET | Freq: Once | ORAL | Status: AC
Start: 2015-01-29 — End: 2015-01-29
  Administered 2015-01-29: 50 mg via ORAL
  Filled 2015-01-28: qty 2

## 2015-01-28 MED ORDER — SODIUM CHLORIDE 0.9 % IV BOLUS (SEPSIS)
500.0000 mL | Freq: Once | INTRAVENOUS | Status: AC
  Administered 2015-01-28: 500 mL via INTRAVENOUS

## 2015-01-28 MED ORDER — LORAZEPAM 2 MG/ML IJ SOLN
2.0000 mg | Freq: Once | INTRAMUSCULAR | Status: AC
  Administered 2015-01-28: 2 mg via INTRAVENOUS

## 2015-01-28 MED ORDER — PHENOBARBITAL 20 MG/5ML PO ELIX
90.0000 mg | ORAL_SOLUTION | Freq: Once | ORAL | Status: AC
  Administered 2015-01-29: 90 mg via ORAL
  Filled 2015-01-28: qty 25

## 2015-01-28 MED ORDER — LORAZEPAM 2 MG/ML IJ SOLN
INTRAMUSCULAR | Status: AC
  Administered 2015-01-28: 2 mg via INTRAVENOUS
  Filled 2015-01-28: qty 1

## 2015-01-28 MED ORDER — LORAZEPAM 1 MG PO TABS: 1.0000 mg | ORAL_TABLET | Freq: Once | ORAL | Status: DC

## 2015-01-28 NOTE — ED Notes (Signed)
Pt having active seizure, staff called to room , pads place, 10 to 15 second, pt stops shaking,  IN attempt to start IV, pt actively start seizure again, pt biting tongue and lip. IV started meds given. Pt on monitor

## 2015-01-28 NOTE — ED Notes (Signed)
Pt arrives by EMS from [facility], staff with pt. Pt found in floor in her room on right side, laceration to forehead, large hematoma. MD at the bedside

## 2015-01-28 NOTE — ED Notes (Signed)
Patient face covered with Blood upon arrival to Department. Cleaned patient wound and face and hand. Patient non verbal.

## 2015-01-28 NOTE — ED Notes (Signed)
MD at the bedside  

## 2015-01-28 NOTE — ED Notes (Signed)
Called to update [facility], with Arline AspCindy [phone], and ask how pt takes meds, was advised pt takes pills whole, pt was able to drink water, pt is alert and talking at present

## 2015-01-28 NOTE — ED Notes (Signed)
[facility] computers are down, staff with pt to confirm story

## 2015-01-29 ENCOUNTER — Encounter (HOSPITAL_COMMUNITY): Payer: Self-pay

## 2015-01-29 ENCOUNTER — Inpatient Hospital Stay (HOSPITAL_COMMUNITY): Payer: Medicare Other

## 2015-01-29 DIAGNOSIS — R569 Unspecified convulsions: Secondary | ICD-10-CM | POA: Diagnosis not present

## 2015-01-29 DIAGNOSIS — I150 Renovascular hypertension: Secondary | ICD-10-CM

## 2015-01-29 DIAGNOSIS — S0083XA Contusion of other part of head, initial encounter: Secondary | ICD-10-CM | POA: Diagnosis present

## 2015-01-29 DIAGNOSIS — I10 Essential (primary) hypertension: Secondary | ICD-10-CM | POA: Diagnosis not present

## 2015-01-29 DIAGNOSIS — G40901 Epilepsy, unspecified, not intractable, with status epilepticus: Secondary | ICD-10-CM | POA: Diagnosis present

## 2015-01-29 DIAGNOSIS — S60212A Contusion of left wrist, initial encounter: Secondary | ICD-10-CM | POA: Diagnosis not present

## 2015-01-29 DIAGNOSIS — Z7401 Bed confinement status: Secondary | ICD-10-CM | POA: Diagnosis not present

## 2015-01-29 DIAGNOSIS — E785 Hyperlipidemia, unspecified: Secondary | ICD-10-CM | POA: Diagnosis not present

## 2015-01-29 DIAGNOSIS — W19XXXA Unspecified fall, initial encounter: Secondary | ICD-10-CM | POA: Diagnosis present

## 2015-01-29 DIAGNOSIS — R279 Unspecified lack of coordination: Secondary | ICD-10-CM | POA: Diagnosis not present

## 2015-01-29 DIAGNOSIS — F79 Unspecified intellectual disabilities: Secondary | ICD-10-CM | POA: Diagnosis not present

## 2015-01-29 DIAGNOSIS — Z8249 Family history of ischemic heart disease and other diseases of the circulatory system: Secondary | ICD-10-CM | POA: Diagnosis not present

## 2015-01-29 DIAGNOSIS — S0181XA Laceration without foreign body of other part of head, initial encounter: Secondary | ICD-10-CM | POA: Diagnosis present

## 2015-01-29 DIAGNOSIS — G40801 Other epilepsy, not intractable, with status epilepticus: Secondary | ICD-10-CM | POA: Diagnosis not present

## 2015-01-29 DIAGNOSIS — W19XXXD Unspecified fall, subsequent encounter: Secondary | ICD-10-CM

## 2015-01-29 DIAGNOSIS — R4182 Altered mental status, unspecified: Secondary | ICD-10-CM | POA: Diagnosis not present

## 2015-01-29 LAB — COMPREHENSIVE METABOLIC PANEL
ALT: 14 U/L (ref 14–54)
AST: 18 U/L (ref 15–41)
Albumin: 3.4 g/dL — ABNORMAL LOW (ref 3.5–5.0)
Alkaline Phosphatase: 55 U/L (ref 38–126)
Anion gap: 7 (ref 5–15)
BUN: 12 mg/dL (ref 6–20)
CHLORIDE: 103 mmol/L (ref 101–111)
CO2: 29 mmol/L (ref 22–32)
Calcium: 9.3 mg/dL (ref 8.9–10.3)
Creatinine, Ser: 0.4 mg/dL — ABNORMAL LOW (ref 0.44–1.00)
GFR calc Af Amer: >60 mL/min (ref >60)
GFR calc non Af Amer: >60 mL/min (ref >60)
Glucose, Bld: 86 mg/dL (ref 70–99)
Potassium: 3.7 mmol/L (ref 3.5–5.1)
Sodium: 139 mmol/L (ref 135–145)
TOTAL PROTEIN: 6 g/dL — AB (ref 6.5–8.1)
Total Bilirubin: 0.4 mg/dL (ref 0.3–1.2)

## 2015-01-29 LAB — CBC
HCT: 34.7 % — ABNORMAL LOW (ref 36.0–46.0)
HEMOGLOBIN: 11.7 g/dL — AB (ref 12.0–15.0)
MCH: 29 pg (ref 26.0–34.0)
MCHC: 33.7 g/dL (ref 30.0–36.0)
MCV: 85.9 fL (ref 78.0–100.0)
PLATELETS: 231 10*3/uL (ref 150–400)
RBC: 4.04 MIL/uL (ref 3.87–5.11)
RDW: 12.2 % (ref 11.5–15.5)
WBC: 8.3 10*3/uL (ref 4.0–10.5)

## 2015-01-29 LAB — I-STAT CHEM 8, ED
BUN: 15 mg/dL (ref 6–20)
CALCIUM ION: 1.26 mmol/L — AB (ref 1.12–1.23)
CHLORIDE: 98 mmol/L — AB (ref 101–111)
Creatinine, Ser: 0.6 mg/dL (ref 0.44–1.00)
GLUCOSE: 137 mg/dL — AB (ref 70–99)
HCT: 36 % (ref 36.0–46.0)
Hemoglobin: 12.2 g/dL (ref 12.0–15.0)
Potassium: 4.1 mmol/L (ref 3.5–5.1)
Sodium: 135 mmol/L (ref 135–145)
TCO2: 24 mmol/L (ref 0–100)

## 2015-01-29 LAB — MRSA PCR SCREENING: MRSA BY PCR: POSITIVE — AB

## 2015-01-29 MED ORDER — ACETAMINOPHEN 325 MG PO TABS: 650.0000 mg | ORAL_TABLET | Freq: Four times a day (QID) | ORAL | Status: DC | PRN

## 2015-01-29 MED ORDER — LAMOTRIGINE 25 MG PO TABS
50.0000 mg | ORAL_TABLET | Freq: Two times a day (BID) | ORAL | Status: DC
  Filled 2015-01-29: qty 2

## 2015-01-29 MED ORDER — SODIUM CHLORIDE 0.9 % IJ SOLN: 3.0000 mL | INTRAMUSCULAR | Status: DC | PRN

## 2015-01-29 MED ORDER — SODIUM CHLORIDE 0.9 % IV SOLN
1000.0000 mg | Freq: Two times a day (BID) | INTRAVENOUS | Status: DC
  Filled 2015-01-29: qty 10

## 2015-01-29 MED ORDER — SODIUM CHLORIDE 0.9 % IV SOLN: INTRAVENOUS | Status: DC

## 2015-01-29 MED ORDER — ONDANSETRON HCL 4 MG PO TABS: 4.0000 mg | ORAL_TABLET | Freq: Four times a day (QID) | ORAL | Status: DC | PRN

## 2015-01-29 MED ORDER — SODIUM CHLORIDE 0.9 % IJ SOLN
3.0000 mL | Freq: Two times a day (BID) | INTRAMUSCULAR | Status: DC
Start: 2015-01-29 — End: 2015-01-30
  Administered 2015-01-29 (×3): 3 mL via INTRAVENOUS

## 2015-01-29 MED ORDER — PHENOBARBITAL 97.2 MG PO TABS
129.6000 mg | ORAL_TABLET | Freq: Every day | ORAL | Status: DC
  Administered 2015-01-29: 129.6 mg via ORAL
  Filled 2015-01-29 (×2): qty 1

## 2015-01-29 MED ORDER — CHLORHEXIDINE GLUCONATE CLOTH 2 % EX PADS
6.0000 | MEDICATED_PAD | Freq: Every day | CUTANEOUS | Status: DC
  Administered 2015-01-29 – 2015-01-30 (×2): 6 via TOPICAL

## 2015-01-29 MED ORDER — QUETIAPINE FUMARATE 25 MG PO TABS
50.0000 mg | ORAL_TABLET | Freq: Every day | ORAL | Status: DC
  Administered 2015-01-29: 50 mg via ORAL
  Filled 2015-01-29: qty 2

## 2015-01-29 MED ORDER — HYDROCHLOROTHIAZIDE 12.5 MG PO CAPS
12.5000 mg | ORAL_CAPSULE | Freq: Every day | ORAL | Status: DC
  Administered 2015-01-29 – 2015-01-30 (×2): 12.5 mg via ORAL
  Filled 2015-01-29 (×2): qty 1

## 2015-01-29 MED ORDER — LAMOTRIGINE 25 MG PO TABS
ORAL_TABLET | ORAL | Status: AC
  Filled 2015-01-29: qty 2

## 2015-01-29 MED ORDER — LEVETIRACETAM IN NACL 1000 MG/100ML IV SOLN
1000.0000 mg | Freq: Two times a day (BID) | INTRAVENOUS | Status: DC
  Administered 2015-01-29 (×2): 1000 mg via INTRAVENOUS
  Filled 2015-01-29 (×5): qty 100

## 2015-01-29 MED ORDER — ONDANSETRON HCL 4 MG/2ML IJ SOLN: 4.0000 mg | Freq: Four times a day (QID) | INTRAMUSCULAR | Status: DC | PRN

## 2015-01-29 MED ORDER — PALIPERIDONE ER 6 MG PO TB24
6.0000 mg | ORAL_TABLET | Freq: Every day | ORAL | Status: DC
  Administered 2015-01-29 – 2015-01-30 (×2): 6 mg via ORAL
  Filled 2015-01-29 (×4): qty 1

## 2015-01-29 MED ORDER — SODIUM CHLORIDE 0.9 % IV SOLN
250.0000 mL | INTRAVENOUS | Status: DC | PRN
Start: 2015-01-29 — End: 2015-01-30

## 2015-01-29 MED ORDER — LORAZEPAM 2 MG/ML IJ SOLN: 2.0000 mg | INTRAMUSCULAR | Status: DC | PRN

## 2015-01-29 MED ORDER — MUPIROCIN 2 % EX OINT
1.0000 "application " | TOPICAL_OINTMENT | Freq: Two times a day (BID) | CUTANEOUS | Status: DC
  Administered 2015-01-29 – 2015-01-30 (×3): 1 via NASAL
  Filled 2015-01-29: qty 22

## 2015-01-29 MED ORDER — ACETAMINOPHEN 650 MG RE SUPP: 650.0000 mg | Freq: Four times a day (QID) | RECTAL | Status: DC | PRN

## 2015-01-29 MED ORDER — OXYBUTYNIN CHLORIDE 5 MG PO TABS
5.0000 mg | ORAL_TABLET | Freq: Every day | ORAL | Status: DC
  Administered 2015-01-29 – 2015-01-30 (×2): 5 mg via ORAL
  Filled 2015-01-29 (×2): qty 1

## 2015-01-29 NOTE — Progress Notes (Signed)
CRITICAL VALUE ALERT  Critical value received:  MRSA+  Date of notification:  01/29/2015   Time of notification:  09:15  Critical value read back:Yes.    Nurse who received alert:  Cyndia DiverLindsi Kemi Gell, RN  MD notified (1st page):  Dr. Kerry HoughMemon  Time of first page:  09:20

## 2015-01-29 NOTE — ED Notes (Signed)
Patient sleeping at this time.

## 2015-01-29 NOTE — ED Provider Notes (Signed)
CSN: [id number]     Arrival date & time 01/28/15  2032 History   First MD Initiated Contact with Patient 01/28/15 2033     Chief Complaint  Patient presents with  . Head Laceration  . Fall     (Consider location/radiation/quality/duration/timing/severity/associated sxs/prior Treatment) Patient is a 59 y.o. female presenting with fall. The history is provided by the EMS personnel (pt found on floor with abrasion to head.  non witnessed fall pt has hx of sz).  Fall This is a new problem. The current episode started 1 to 2 hours ago. The problem occurs rarely. The problem has been resolved. Pertinent negatives include no chest pain and no abdominal pain. Nothing aggravates the symptoms. Nothing relieves the symptoms.    Past Medical History  Diagnosis Date  . Hypertension   . Seizures   . Mental retardation    History reviewed. No pertinent past surgical history. Family History  Problem Relation Age of Onset  . Heart disease Mother    History  Substance Use Topics  . Smoking status: Never Smoker   . Smokeless tobacco: Never Used  . Alcohol Use: No   OB History    No data available     Review of Systems  Unable to perform ROS: Mental status change  Cardiovascular: Negative for chest pain.  Gastrointestinal: Negative for abdominal pain.      Allergies  Depakote and Haldol  Home Medications   Prior to Admission medications   Medication Sig Start Date End Date Taking? Authorizing Provider  chlorhexidine (PERIDEX) 0.12 % solution Use as directed 10 mLs in the mouth or throat 4 (four) times daily.   Yes Historical Provider, MD  hydrochlorothiazide (MICROZIDE) 12.5 MG capsule Take 12.5 mg by mouth daily.   Yes Historical Provider, MD  hydrocortisone (ANUSOL-HC) 2.5 % rectal cream Place 1 application rectally 2 (two) times daily as needed for hemorrhoids.     Historical Provider, MD  lamoTRIgine (LAMICTAL) 25 MG tablet Take 2 tablets (50 mg total) by mouth 2 (two) times  daily. 11/26/14  Yes Leroy SeaPrashant K Singh, MD  levETIRAcetam (KEPPRA) 500 MG tablet Take 1 tablet (500 mg total) by mouth 2 (two) times daily. 05/05/14  Yes Lesle ChrisKaren M Black, NP  LORazepam (ATIVAN) 2 MG/ML injection Inject 1 mg into the vein as needed.   Yes Historical Provider, MD  moexipril (UNIVASC) 7.5 MG tablet Take 7.5 mg by mouth daily.  12/30/13  Yes Historical Provider, MD  oxybutynin (DITROPAN) 5 MG tablet Take 5 mg by mouth daily.  12/30/13  Yes Historical Provider, MD  paliperidone (INVEGA) 6 MG 24 hr tablet Take 6 mg by mouth daily.    Historical Provider, MD  PHENobarbital (LUMINAL) 64.8 MG tablet Take 2 tablets (129.6 mg total) by mouth at bedtime. 10/11/14  Yes Lesle ChrisKaren M Black, NP  QUEtiapine (SEROQUEL) 50 MG tablet Take 1 tablet (50 mg total) by mouth at bedtime. 10/11/14  Yes Lesle ChrisKaren M Black, NP  simvastatin (ZOCOR) 20 MG tablet Take 20 mg by mouth at bedtime.  12/30/13  Yes Historical Provider, MD  Vitamin D, Ergocalciferol, (DRISDOL) 50000 UNITS CAPS capsule Take 50,000 Units by mouth every 7 (seven) days. Takes on Saturdays.   Yes Historical Provider, MD  vitamin E 200 UNIT capsule Take 200 Units by mouth daily.   Yes Historical Provider, MD   BP 136/74 mmHg  Pulse 100  Temp(Src) 98.8 F (37.1 C) (Oral)  Resp 23  SpO2 99% Physical Exam  Constitutional: She appears  well-developed.  HENT:  Head: Normocephalic.  2 cm lac to forehead  Eyes: Conjunctivae and EOM are normal. No scleral icterus.  Neck: Neck supple. No thyromegaly present.  Cardiovascular: Normal rate and regular rhythm.  Exam reveals no gallop and no friction rub.   No murmur heard. Pulmonary/Chest: No stridor. She has no wheezes. She has no rales. She exhibits no tenderness.  Abdominal: She exhibits no distension. There is no tenderness. There is no rebound.  Musculoskeletal: Normal range of motion. She exhibits no edema.  Lymphadenopathy:    She has no cervical adenopathy.  Neurological: She is alert. She exhibits normal  muscle tone. Coordination normal.  Oriented to person only,  This is her normal  Skin: No rash noted. No erythema.  Psychiatric: She has a normal mood and affect. Her behavior is normal.    ED Course  LACERATION REPAIR Date/Time: 01/29/2015 12:32 AM Performed by: [name] Authorized by: [name] Comments: 2cm lac to forehead.  Area cleaned with betadine.  4 Staples placed in forehead   (including critical care time) Labs Review Labs Reviewed  I-STAT CHEM 8, ED - Abnormal; Notable for the following:    Chloride 98 (*)    Glucose, Bld 137 (*)    Calcium, Ion 1.26 (*)    All other components within normal limits  PHENOBARBITAL LEVEL    Imaging Review Ct Head Wo Contrast  01/28/2015   CLINICAL DATA:  Initial evaluation for acute trauma, forehead contusion.  EXAM: CT HEAD WITHOUT CONTRAST  CT MAXILLOFACIAL WITHOUT CONTRAST  CT CERVICAL SPINE WITHOUT CONTRAST  TECHNIQUE: Multidetector CT imaging of the head, cervical spine, and maxillofacial structures were performed using the standard protocol without intravenous contrast. Multiplanar CT image reconstructions of the cervical spine and maxillofacial structures were also generated.  COMPARISON:  Prior study from 11/24/2014.  FINDINGS: CT HEAD FINDINGS  Study is somewhat degraded by motion artifact.  Cerebellar atrophy noted. Mild chronic small vessel ischemic changes. No acute intracranial hemorrhage or large vessel territory infarct. No extra-axial fluid collection. No mass lesion or midline shift. No hydrocephalus.  Contusion present at the central forehead. Scalp soft tissues otherwise unremarkable. No acute abnormality about the orbits.  Calvarium intact.  CT MAXILLOFACIAL FINDINGS  Study is severely limited by motion artifact.  Globes are intact. No retro-orbital pathology. Bony orbits are grossly intact without definite orbital floor fracture. No intraorbital gas to suggest orbital fracture.  Zygomatic arches grossly intact.  Mandible grossly intact. No definite maxillary fracture. Irregularity about the nasal bones favored to be chronic in nature with no definite overlying soft tissue swelling. Pterygoid plates intact.  No significant soft tissue swelling seen elsewhere within the face. Paranasal sinuses are clear. No mastoid effusion.  CT CERVICAL SPINE FINDINGS  Studies limited by motion artifact.  Vertebral bodies are normally aligned with preservation of the normal cervical lordosis. Vertebral body heights grossly maintained. Evaluation of the dens is somewhat limited by motion. No definite fracture. No listhesis. Prevertebral soft tissues are normal.  No acute soft tissue abnormality within the neck. Visualized lungs are clear.  IMPRESSION: CT HEAD:  1. No acute intracranial process. 2. Central forehead contusion. 3. Cerebellar atrophy.  CT MAXILLOFACIAL:  Limited study due to motion with no definite acute maxillofacial injury identified.  CT CERVICAL SPINE:  Somewhat limited study due to motion. No definite acute traumatic injury within the cervical spine.   Electronically Signed   By: Rise Mu M.D.   On: 01/28/2015 22:13   Ct  Cervical Spine Wo Contrast  01/28/2015   CLINICAL DATA:  Initial evaluation for acute trauma, forehead contusion.  EXAM: CT HEAD WITHOUT CONTRAST  CT MAXILLOFACIAL WITHOUT CONTRAST  CT CERVICAL SPINE WITHOUT CONTRAST  TECHNIQUE: Multidetector CT imaging of the head, cervical spine, and maxillofacial structures were performed using the standard protocol without intravenous contrast. Multiplanar CT image reconstructions of the cervical spine and maxillofacial structures were also generated.  COMPARISON:  Prior study from 11/24/2014.  FINDINGS: CT HEAD FINDINGS  Study is somewhat degraded by motion artifact.  Cerebellar atrophy noted. Mild chronic small vessel ischemic changes. No acute intracranial hemorrhage or large vessel territory infarct. No extra-axial fluid collection. No mass lesion or  midline shift. No hydrocephalus.  Contusion present at the central forehead. Scalp soft tissues otherwise unremarkable. No acute abnormality about the orbits.  Calvarium intact.  CT MAXILLOFACIAL FINDINGS  Study is severely limited by motion artifact.  Globes are intact. No retro-orbital pathology. Bony orbits are grossly intact without definite orbital floor fracture. No intraorbital gas to suggest orbital fracture.  Zygomatic arches grossly intact. Mandible grossly intact. No definite maxillary fracture. Irregularity about the nasal bones favored to be chronic in nature with no definite overlying soft tissue swelling. Pterygoid plates intact.  No significant soft tissue swelling seen elsewhere within the face. Paranasal sinuses are clear. No mastoid effusion.  CT CERVICAL SPINE FINDINGS  Studies limited by motion artifact.  Vertebral bodies are normally aligned with preservation of the normal cervical lordosis. Vertebral body heights grossly maintained. Evaluation of the dens is somewhat limited by motion. No definite fracture. No listhesis. Prevertebral soft tissues are normal.  No acute soft tissue abnormality within the neck. Visualized lungs are clear.  IMPRESSION: CT HEAD:  1. No acute intracranial process. 2. Central forehead contusion. 3. Cerebellar atrophy.  CT MAXILLOFACIAL:  Limited study due to motion with no definite acute maxillofacial injury identified.  CT CERVICAL SPINE:  Somewhat limited study due to motion. No definite acute traumatic injury within the cervical spine.   Electronically Signed   By: Rise Mu M.D.   On: 01/28/2015 22:13   Ct Maxillofacial Wo Cm  01/28/2015   CLINICAL DATA:  Initial evaluation for acute trauma, forehead contusion.  EXAM: CT HEAD WITHOUT CONTRAST  CT MAXILLOFACIAL WITHOUT CONTRAST  CT CERVICAL SPINE WITHOUT CONTRAST  TECHNIQUE: Multidetector CT imaging of the head, cervical spine, and maxillofacial structures were performed using the standard protocol  without intravenous contrast. Multiplanar CT image reconstructions of the cervical spine and maxillofacial structures were also generated.  COMPARISON:  Prior study from 11/24/2014.  FINDINGS: CT HEAD FINDINGS  Study is somewhat degraded by motion artifact.  Cerebellar atrophy noted. Mild chronic small vessel ischemic changes. No acute intracranial hemorrhage or large vessel territory infarct. No extra-axial fluid collection. No mass lesion or midline shift. No hydrocephalus.  Contusion present at the central forehead. Scalp soft tissues otherwise unremarkable. No acute abnormality about the orbits.  Calvarium intact.  CT MAXILLOFACIAL FINDINGS  Study is severely limited by motion artifact.  Globes are intact. No retro-orbital pathology. Bony orbits are grossly intact without definite orbital floor fracture. No intraorbital gas to suggest orbital fracture.  Zygomatic arches grossly intact. Mandible grossly intact. No definite maxillary fracture. Irregularity about the nasal bones favored to be chronic in nature with no definite overlying soft tissue swelling. Pterygoid plates intact.  No significant soft tissue swelling seen elsewhere within the face. Paranasal sinuses are clear. No mastoid effusion.  CT CERVICAL SPINE FINDINGS  Studies limited by motion artifact.  Vertebral bodies are normally aligned with preservation of the normal cervical lordosis. Vertebral body heights grossly maintained. Evaluation of the dens is somewhat limited by motion. No definite fracture. No listhesis. Prevertebral soft tissues are normal.  No acute soft tissue abnormality within the neck. Visualized lungs are clear.  IMPRESSION: CT HEAD:  1. No acute intracranial process. 2. Central forehead contusion. 3. Cerebellar atrophy.  CT MAXILLOFACIAL:  Limited study due to motion with no definite acute maxillofacial injury identified.  CT CERVICAL SPINE:  Somewhat limited study due to motion. No definite acute traumatic injury within the  cervical spine.   Electronically Signed   By: Rise MuBenjamin  McClintock M.D.   On: 01/28/2015 22:13     EKG Interpretation None     CRITICAL CARE Performed by: Rosaleigh Brazzel L Total critical care time:40 Critical care time was exclusive of separately billable procedures and treating other patients. Critical care was necessary to treat or prevent imminent or life-threatening deterioration. Critical care was time spent personally by me on the following activities: development of treatment plan with patient and/or surrogate as well as nursing, discussions with consultants, evaluation of patient's response to treatment, examination of patient, obtaining history from patient or surrogate, ordering and performing treatments and interventions, ordering and review of laboratory studies, ordering and review of radiographic studies, pulse oximetry and re-evaluation of patient's condition.  Pt admitted for sz.   Staples in forehead can come out in one week. MDM   Final diagnoses:  Seizure       Bethann BerkshireJoseph Layana Konkel, MD 01/29/15 [phone]

## 2015-01-29 NOTE — ED Notes (Signed)
Spoke with patient's mother, [name], at [facility] and informed her that patient will be admitted for seizures.

## 2015-01-29 NOTE — ED Notes (Signed)
Dr. Estell HarpinZammit placed 4 staples in forehead.

## 2015-01-29 NOTE — Progress Notes (Signed)
Patient admitted to the hospital earlier this morning by Dr. Sharl MaLama  Patient seen and examined. Very difficult to comprehend her speech. She reports some pain around her IV site. No other complaints. She does not appear to have any acute neurologic deficits  She was admitted after falling, suffering some superficial head injuries and having multiple seizures. She has known seizure disorder and is on multiple anti epileptics. She received bolus dose of iv keppra in the ED. She will need neurology consultation, which will be done tomorrow. Will order EEG. Phenobarb level is in low therapeutic range.  Christine Pena

## 2015-01-29 NOTE — H&P (Signed)
PCP:   Colette Ribas, MD   Chief Complaint:  Seizure  HPI:  59 year old female who  has a past medical history of Hypertension; Seizures; and Mental retardation. who was brought to the ED from [facility] after patient was found on the floor in her room with  laceration to forehead and large hematoma. In the ED patient had 2 episodes of seizure lasting 10-15 seconds each. Patient was given 1 g of Keppra and her home medications Lamictal 50 kg by mouth 1. Patient is a poor historian due to mental retardation, though she denies any pain, no dysuria, no fever, no nausea vomiting.   Allergies:   Allergies  Allergen Reactions  . Depakote [Valproic Acid]     altered mental status/ high ammonia  . Haldol [Haloperidol Lactate]     Unknown reaction.      Past Medical History  Diagnosis Date  . Hypertension   . Seizures   . Mental retardation     History reviewed. No pertinent past surgical history.  Prior to Admission medications   Medication Sig Start Date End Date Taking? Authorizing Provider  chlorhexidine (PERIDEX) 0.12 % solution Use as directed 10 mLs in the mouth or throat 4 (four) times daily.   Yes Historical Provider, MD  hydrochlorothiazide (MICROZIDE) 12.5 MG capsule Take 12.5 mg by mouth daily.   Yes Historical Provider, MD  hydrocortisone (ANUSOL-HC) 2.5 % rectal cream Place 1 application rectally 2 (two) times daily as needed for hemorrhoids.     Historical Provider, MD  lamoTRIgine (LAMICTAL) 25 MG tablet Take 2 tablets (50 mg total) by mouth 2 (two) times daily. 11/26/14  Yes Leroy Sea, MD  levETIRAcetam (KEPPRA) 500 MG tablet Take 1 tablet (500 mg total) by mouth 2 (two) times daily. 05/05/14  Yes Lesle Chris Black, NP  LORazepam (ATIVAN) 2 MG/ML injection Inject 1 mg into the vein as needed.   Yes Historical Provider, MD  moexipril (UNIVASC) 7.5 MG tablet Take 7.5 mg by mouth daily.  12/30/13  Yes Historical Provider, MD  oxybutynin (DITROPAN) 5 MG  tablet Take 5 mg by mouth daily.  12/30/13  Yes Historical Provider, MD  paliperidone (INVEGA) 6 MG 24 hr tablet Take 6 mg by mouth daily.    Historical Provider, MD  PHENobarbital (LUMINAL) 64.8 MG tablet Take 2 tablets (129.6 mg total) by mouth at bedtime. 10/11/14  Yes Lesle Chris Black, NP  QUEtiapine (SEROQUEL) 50 MG tablet Take 1 tablet (50 mg total) by mouth at bedtime. 10/11/14  Yes Lesle Chris Black, NP  simvastatin (ZOCOR) 20 MG tablet Take 20 mg by mouth at bedtime.  12/30/13  Yes Historical Provider, MD  Vitamin D, Ergocalciferol, (DRISDOL) 50000 UNITS CAPS capsule Take 50,000 Units by mouth every 7 (seven) days. Takes on Saturdays.   Yes Historical Provider, MD  vitamin E 200 UNIT capsule Take 200 Units by mouth daily.   Yes Historical Provider, MD    Social History:  reports that she has never smoked. She has never used smokeless tobacco. She reports that she does not drink alcohol or use illicit drugs.  Family History  Problem Relation Age of Onset  . Heart disease Mother    Review of Systems:   All the positives are listed in history of present illness, rest are negative   Physical Exam: Blood pressure 135/64, pulse 96, temperature 98.8 F (37.1 C), temperature source Oral, resp. rate 27, height  (1.651 m), weight 92.216 kg (203 lb  4.8 oz), SpO2 100 %. Constitutional:   Patient is a well-developed and well-nourished female* in no acute distress and cooperative with exam. Head: laceration 2 cm noted on the forehead, staples in place Eyes: PERRL, EOMI, conjunctivae normal Neck: Supple, No Thyromegaly Cardiovascular: RRR, S1 normal, S2 normal Pulmonary/Chest: CTAB, no wheezes, rales, or rhonchi Abdominal: Soft. Non-tender, non-distended, bowel sounds are normal, no masses, organomegaly, or guarding present.  Neurological: Alert but not oriented,  follows command, moving all extremities Extremities : No Cyanosis, Clubbing or Edema  Labs on Admission:  Basic Metabolic  Panel:  Recent Labs Lab 01/29/15 0007  NA 135  K 4.1  CL 98*  GLUCOSE 137*  BUN 15  CREATININE 0.60   CBC:  Recent Labs Lab 01/29/15 0007  HGB 12.2  HCT 36.0   Radiological Exams on Admission: Ct Head Wo Contrast  01/28/2015   CLINICAL DATA:  Initial evaluation for acute trauma, forehead contusion.  EXAM: CT HEAD WITHOUT CONTRAST  CT MAXILLOFACIAL WITHOUT CONTRAST  CT CERVICAL SPINE WITHOUT CONTRAST  TECHNIQUE: Multidetector CT imaging of the head, cervical spine, and maxillofacial structures were performed using the standard protocol without intravenous contrast. Multiplanar CT image reconstructions of the cervical spine and maxillofacial structures were also generated.  COMPARISON:  Prior study from 11/24/2014.  FINDINGS: CT HEAD FINDINGS  Study is somewhat degraded by motion artifact.  Cerebellar atrophy noted. Mild chronic small vessel ischemic changes. No acute intracranial hemorrhage or large vessel territory infarct. No extra-axial fluid collection. No mass lesion or midline shift. No hydrocephalus.  Contusion present at the central forehead. Scalp soft tissues otherwise unremarkable. No acute abnormality about the orbits.  Calvarium intact.  CT MAXILLOFACIAL FINDINGS  Study is severely limited by motion artifact.  Globes are intact. No retro-orbital pathology. Bony orbits are grossly intact without definite orbital floor fracture. No intraorbital gas to suggest orbital fracture.  Zygomatic arches grossly intact. Mandible grossly intact. No definite maxillary fracture. Irregularity about the nasal bones favored to be chronic in nature with no definite overlying soft tissue swelling. Pterygoid plates intact.  No significant soft tissue swelling seen elsewhere within the face. Paranasal sinuses are clear. No mastoid effusion.  CT CERVICAL SPINE FINDINGS  Studies limited by motion artifact.  Vertebral bodies are normally aligned with preservation of the normal cervical lordosis. Vertebral  body heights grossly maintained. Evaluation of the dens is somewhat limited by motion. No definite fracture. No listhesis. Prevertebral soft tissues are normal.  No acute soft tissue abnormality within the neck. Visualized lungs are clear.  IMPRESSION: CT HEAD:  1. No acute intracranial process. 2. Central forehead contusion. 3. Cerebellar atrophy.  CT MAXILLOFACIAL:  Limited study due to motion with no definite acute maxillofacial injury identified.  CT CERVICAL SPINE:  Somewhat limited study due to motion. No definite acute traumatic injury within the cervical spine.   Electronically Signed   By: Rise MuBenjamin  McClintock M.D.   On: 01/28/2015 22:13   Ct Cervical Spine Wo Contrast  01/28/2015   CLINICAL DATA:  Initial evaluation for acute trauma, forehead contusion.  EXAM: CT HEAD WITHOUT CONTRAST  CT MAXILLOFACIAL WITHOUT CONTRAST  CT CERVICAL SPINE WITHOUT CONTRAST  TECHNIQUE: Multidetector CT imaging of the head, cervical spine, and maxillofacial structures were performed using the standard protocol without intravenous contrast. Multiplanar CT image reconstructions of the cervical spine and maxillofacial structures were also generated.  COMPARISON:  Prior study from 11/24/2014.  FINDINGS: CT HEAD FINDINGS  Study is somewhat degraded by motion artifact.  Cerebellar  atrophy noted. Mild chronic small vessel ischemic changes. No acute intracranial hemorrhage or large vessel territory infarct. No extra-axial fluid collection. No mass lesion or midline shift. No hydrocephalus.  Contusion present at the central forehead. Scalp soft tissues otherwise unremarkable. No acute abnormality about the orbits.  Calvarium intact.  CT MAXILLOFACIAL FINDINGS  Study is severely limited by motion artifact.  Globes are intact. No retro-orbital pathology. Bony orbits are grossly intact without definite orbital floor fracture. No intraorbital gas to suggest orbital fracture.  Zygomatic arches grossly intact. Mandible grossly intact. No  definite maxillary fracture. Irregularity about the nasal bones favored to be chronic in nature with no definite overlying soft tissue swelling. Pterygoid plates intact.  No significant soft tissue swelling seen elsewhere within the face. Paranasal sinuses are clear. No mastoid effusion.  CT CERVICAL SPINE FINDINGS  Studies limited by motion artifact.  Vertebral bodies are normally aligned with preservation of the normal cervical lordosis. Vertebral body heights grossly maintained. Evaluation of the dens is somewhat limited by motion. No definite fracture. No listhesis. Prevertebral soft tissues are normal.  No acute soft tissue abnormality within the neck. Visualized lungs are clear.  IMPRESSION: CT HEAD:  1. No acute intracranial process. 2. Central forehead contusion. 3. Cerebellar atrophy.  CT MAXILLOFACIAL:  Limited study due to motion with no definite acute maxillofacial injury identified.  CT CERVICAL SPINE:  Somewhat limited study due to motion. No definite acute traumatic injury within the cervical spine.   Electronically Signed   By: Rise MuBenjamin  McClintock M.D.   On: 01/28/2015 22:13   Ct Maxillofacial Wo Cm  01/28/2015   CLINICAL DATA:  Initial evaluation for acute trauma, forehead contusion.  EXAM: CT HEAD WITHOUT CONTRAST  CT MAXILLOFACIAL WITHOUT CONTRAST  CT CERVICAL SPINE WITHOUT CONTRAST  TECHNIQUE: Multidetector CT imaging of the head, cervical spine, and maxillofacial structures were performed using the standard protocol without intravenous contrast. Multiplanar CT image reconstructions of the cervical spine and maxillofacial structures were also generated.  COMPARISON:  Prior study from 11/24/2014.  FINDINGS: CT HEAD FINDINGS  Study is somewhat degraded by motion artifact.  Cerebellar atrophy noted. Mild chronic small vessel ischemic changes. No acute intracranial hemorrhage or large vessel territory infarct. No extra-axial fluid collection. No mass lesion or midline shift. No hydrocephalus.   Contusion present at the central forehead. Scalp soft tissues otherwise unremarkable. No acute abnormality about the orbits.  Calvarium intact.  CT MAXILLOFACIAL FINDINGS  Study is severely limited by motion artifact.  Globes are intact. No retro-orbital pathology. Bony orbits are grossly intact without definite orbital floor fracture. No intraorbital gas to suggest orbital fracture.  Zygomatic arches grossly intact. Mandible grossly intact. No definite maxillary fracture. Irregularity about the nasal bones favored to be chronic in nature with no definite overlying soft tissue swelling. Pterygoid plates intact.  No significant soft tissue swelling seen elsewhere within the face. Paranasal sinuses are clear. No mastoid effusion.  CT CERVICAL SPINE FINDINGS  Studies limited by motion artifact.  Vertebral bodies are normally aligned with preservation of the normal cervical lordosis. Vertebral body heights grossly maintained. Evaluation of the dens is somewhat limited by motion. No definite fracture. No listhesis. Prevertebral soft tissues are normal.  No acute soft tissue abnormality within the neck. Visualized lungs are clear.  IMPRESSION: CT HEAD:  1. No acute intracranial process. 2. Central forehead contusion. 3. Cerebellar atrophy.  CT MAXILLOFACIAL:  Limited study due to motion with no definite acute maxillofacial injury identified.  CT CERVICAL SPINE:  Somewhat limited study due to motion. No definite acute traumatic injury within the cervical spine.   Electronically Signed   By: Rise Mu M.D.   On: 01/28/2015 22:13    EKG: Independently reviewed.    Assessment/Plan Active Problems:   Mental retardation   Seizure   Hypertension   HLD (hyperlipidemia)  Seizure disorder Patient had 2 episodes in the ED and one possible seizure at the skilled facility. 1 g of Keppra given the ED, patient usually takes 500 mg of Keppra twice a day. Will also start Ativan 2 mg IV every 4 hours when necessary  for seizure. Consult neurology in a.m. also continue Lamictal 50 mg twice a day, phenobarbital 129.6 mg at bedtime.  Hypertension Continue HCTZ  Code status: Presumed full code  Family discussion: No family at bedside  Spent on Admission: 55 minutes  Emiel Kielty S [facility] Pager: [phone] 01/29/2015, 2:05 AM  If 7PM-7AM, please contact night-coverage  [email or web address]  Password [email or web address]

## 2015-01-30 ENCOUNTER — Inpatient Hospital Stay (HOSPITAL_COMMUNITY)
Admit: 2015-01-30 | Discharge: 2015-01-30 | Disposition: A | Discharge status: Home / Self Care | Payer: Medicare Other | Attending: Internal Medicine | Admitting: Internal Medicine

## 2015-01-30 MED ORDER — LEVETIRACETAM 500 MG PO TABS: 500.0000 mg | ORAL_TABLET | Freq: Two times a day (BID) | ORAL | Status: DC

## 2015-01-30 MED ORDER — LAMOTRIGINE 25 MG PO TABS
75.0000 mg | ORAL_TABLET | Freq: Two times a day (BID) | ORAL | Status: DC
  Administered 2015-01-30: 75 mg via ORAL
  Filled 2015-01-30 (×5): qty 3

## 2015-01-30 MED ORDER — LAMOTRIGINE 25 MG PO TABS: 75.0000 mg | ORAL_TABLET | Freq: Two times a day (BID) | ORAL | Status: DC

## 2015-01-30 MED ORDER — LEVETIRACETAM 500 MG PO TABS
1000.0000 mg | ORAL_TABLET | Freq: Two times a day (BID) | ORAL | Status: DC
  Administered 2015-01-30: 1000 mg via ORAL
  Filled 2015-01-30: qty 2

## 2015-01-30 MED ORDER — LEVETIRACETAM 1000 MG PO TABS: 1000.0000 mg | ORAL_TABLET | Freq: Two times a day (BID) | ORAL | Status: DC

## 2015-01-30 MED ORDER — LEVETIRACETAM 500 MG PO TABS: 1000.0000 mg | ORAL_TABLET | Freq: Two times a day (BID) | ORAL | Status: DC

## 2015-01-30 NOTE — Consult Note (Signed)
Christine A. Christine Laughter, MD     [email or web address]          Christine Pena is an 59 y.o. female.   ASSESSMENT/PLAN: 1. Resolved status epilepticus. 2. Poorly controlled epilepsy. 3. Mental retardation. RECOMMENDATION: Agree with increase of Keppra to a 137m twice a day. We'll also increase the Lamictal to 75 mg twice a day. Follow-up EEG. Continue with phenobarbital 130 mg nightly.  The patient is a 59year old white female who has a baseline history of poorly controlled epilepsy. She has been hospitalized unfortunately a few times for status epilepticus. Her last Series of seizures was in March of this year. It appears this to resolve for recurrent seizures started after she developed toxicity from deapkote and this medication had to be discontinued. The patient was groggy after her seizures. She did receive Ativan and a loading dose of Keppra. She appears to be awakened back to baseline. She reports that she is feeling well. She does complain of some back pain and abdominal pain. Otherwise she is appears to be fine. Review of systems is otherwise negative.  GENERAL: She is in no acute distress.  HEENT: Supple. There is a sutured 1 inch laceration frontal region Center area. No ecchymosis or petechiae are seen.   ABDOMEN: soft  EXTREMITIES: No edema   BACK: Normal.  SKIN: Normal by inspection.    MENTAL STATUS: She is awake and alert. She follows commands briskly. She knows the season the hospital. She does have baseline severe dysarthria.  CRANIAL NERVES: Pupils are equal, round and reactive to light; extra ocular movements are full, there is no significant nystagmus; visual fields are Limited due to cognition but appears to be full; upper and lower facial muscles are normal in strength and symmetric, there is no flattening of the nasolabial folds; tongue is midline; uvula is midline; shoulder elevation is normal.  MOTOR: Normal tone, bulk and strength; no  pronator drift.  COORDINATION: There is no dysmetria. No rest tremor; no intention tremor; no postural tremor; no bradykinesia.  REFLEXES: Deep tendon reflexes are symmetrical and normal. Babinski reflexes are flexor bilaterally.   SENSATION: Normal to Painful stimuli.      [[[[[[[[[[[[[[[[[[[[[[[[[[[[[[[[[MY PRIOR CONSULT  11-2014 4. Multiple seizures causing altered mentation due to breakthrough on therapy medications for epilepsy. 5. Poorly controlled epilepsy. 6. Mental retardation at baseline. 7. Gait impairment due to multiple seizures.  RECOMMENDATION Increase Lamictal to 50 mg twice a day. Continue with Keppra and phenobarbital at the same doses. However, I think we should consider reduce the dose of phenobarbital. This has been done extremely slowly to avoid phenobarbital withdrawal seizures which can be quite severe. We may consider this in the outpatient setting.  The patient is a 59year old white female who has a baseline history of epilepsy. The patient was admitted to the hospital middle of last year 2015 with the multiple seizures-status epilepticus after she was taken off Depakote. She was taken off Depakote because of altered mental status and elevated ammonia level. She was placed on Keppra at that time. It appears that she is also was started on the Lamictal sometime after this. She currently remains on low dose Lamictal. The patient presented to the hospital a few months later with altered mentation. This was in January 2016. Reasons were not ever quite clear but there is suspicion that the patient had a seizure or a few seizures causing the altered mental status and that she also had gait impairment at that time. Because  of the issue of polypharmacy, I did reduce her phenobarbital from a regimen of 130 mg nightly alternating with 198 mg every other night. She was reduced on 130 milligrams daily and nighttime. Appears that she has had more seizures over the last several weeks  associated with increasing confusion and gait impairment especially over the last few days. The review of systems is difficult because of the baseline confusion but seems otherwise unremarkable.]]]]]]]]]]]]]]]]]]]]]]]]]]]]]]]]]]]]]]]]]]]]]]]]]]    Blood pressure 118/68, pulse 71, temperature 98.7 F (37.1 C), temperature source Oral, resp. rate 20, height '5\' 5"'  (1.651 m), weight 92.08 kg (203 lb), SpO2 96 %.  Past Medical History  Diagnosis Date  . Hypertension   . Seizures   . Mental retardation     History reviewed. No pertinent past surgical history.  Family History  Problem Relation Age of Onset  . Heart disease Mother     Social History:  reports that she has never smoked. She has never used smokeless tobacco. She reports that she does not drink alcohol or use illicit drugs.  Allergies:  Allergies  Allergen Reactions  . Depakote [Valproic Acid]     altered mental status/ high ammonia  . Haldol [Haloperidol Lactate]     Unknown reaction.    Medications: Prior to Admission medications   Medication Sig Start Date End Date Taking? Authorizing Provider  chlorhexidine (PERIDEX) 0.12 % solution Use as directed 10 mLs in the mouth or throat 4 (four) times daily.   Yes Historical Provider, MD  hydrochlorothiazide (MICROZIDE) 12.5 MG capsule Take 12.5 mg by mouth daily.   Yes Historical Provider, MD  hydrocortisone (ANUSOL-HC) 2.5 % rectal cream Place 1 application rectally 2 (two) times daily as needed for hemorrhoids.     Historical Provider, MD  lamoTRIgine (LAMICTAL) 25 MG tablet Take 2 tablets (50 mg total) by mouth 2 (two) times daily. 11/26/14  Yes Thurnell Lose, MD  levETIRAcetam (KEPPRA) 500 MG tablet Take 1 tablet (500 mg total) by mouth 2 (two) times daily. 05/05/14  Yes Lezlie Octave Black, NP  LORazepam (ATIVAN) 2 MG/ML injection Inject 1 mg into the vein as needed.   Yes Historical Provider, MD  moexipril (UNIVASC) 7.5 MG tablet Take 7.5 mg by mouth daily.  12/30/13  Yes  Historical Provider, MD  oxybutynin (DITROPAN) 5 MG tablet Take 5 mg by mouth daily.  12/30/13  Yes Historical Provider, MD  paliperidone (INVEGA) 6 MG 24 hr tablet Take 6 mg by mouth daily.    Historical Provider, MD  PHENobarbital (LUMINAL) 64.8 MG tablet Take 2 tablets (129.6 mg total) by mouth at bedtime. 10/11/14  Yes Lezlie Octave Black, NP  QUEtiapine (SEROQUEL) 50 MG tablet Take 1 tablet (50 mg total) by mouth at bedtime. 10/11/14  Yes Lezlie Octave Black, NP  simvastatin (ZOCOR) 20 MG tablet Take 20 mg by mouth at bedtime.  12/30/13  Yes Historical Provider, MD  Vitamin D, Ergocalciferol, (DRISDOL) 50000 UNITS CAPS capsule Take 50,000 Units by mouth every 7 (seven) days. Takes on Saturdays.   Yes Historical Provider, MD  vitamin E 200 UNIT capsule Take 200 Units by mouth daily.   Yes Historical Provider, MD    Scheduled Meds: . Chlorhexidine Gluconate Cloth  6 each Topical Q0600  . hydrochlorothiazide  12.5 mg Oral Daily  . lamoTRIgine  50 mg Oral BID  . levETIRAcetam  1,000 mg Intravenous Q12H  . mupirocin ointment  1 application Nasal BID  . oxybutynin  5 mg Oral Daily  . paliperidone  6 mg Oral Daily  . PHENobarbital  129.6 mg Oral QHS  . QUEtiapine  50 mg Oral QHS  . sodium chloride  3 mL Intravenous Q12H   Continuous Infusions: . sodium chloride     PRN Meds:.sodium chloride, acetaminophen **OR** acetaminophen, LORazepam, ondansetron **OR** ondansetron (ZOFRAN) IV, sodium chloride     Results for orders placed or performed during the hospital encounter of 01/28/15 (from the past 48 hour(s))  Phenobarbital level     Status: None   Collection Time: 01/28/15 10:19 PM  Result Value Ref Range   Phenobarbital 15.6 15.0 - 40.0 ug/mL    Comment: SLIGHT HEMOLYSIS  I-stat chem 8, ed     Status: Abnormal   Collection Time: 01/29/15 12:07 AM  Result Value Ref Range   Sodium 135 135 - 145 mmol/L   Potassium 4.1 3.5 - 5.1 mmol/L   Chloride 98 (L) 101 - 111 mmol/L   BUN 15 6 - 20 mg/dL    Creatinine, Ser 0.60 0.44 - 1.00 mg/dL   Glucose, Bld 137 (H) 70 - 99 mg/dL   Calcium, Ion 1.26 (H) 1.12 - 1.23 mmol/L   TCO2 24 0 - 100 mmol/L   Hemoglobin 12.2 12.0 - 15.0 g/dL   HCT 36.0 36.0 - 46.0 %  MRSA PCR Screening     Status: Abnormal   Collection Time: 01/29/15  3:30 AM  Result Value Ref Range   MRSA by PCR POSITIVE (A) NEGATIVE    Comment:        The GeneXpert MRSA Assay (FDA approved for NASAL specimens only), is one component of a comprehensive MRSA colonization surveillance program. It is not intended to diagnose MRSA infection nor to guide or monitor treatment for MRSA infections. RESULT CALLED TO, READ BACK BY AND VERIFIED WITH: L.FORTE AT 0911 ON 01/29/15 BY S.VANHOORNE   CBC     Status: Abnormal   Collection Time: 01/29/15  6:03 AM  Result Value Ref Range   WBC 8.3 4.0 - 10.5 K/uL   RBC 4.04 3.87 - 5.11 MIL/uL   Hemoglobin 11.7 (L) 12.0 - 15.0 g/dL   HCT 34.7 (L) 36.0 - 46.0 %   MCV 85.9 78.0 - 100.0 fL   MCH 29.0 26.0 - 34.0 pg   MCHC 33.7 30.0 - 36.0 g/dL   RDW 12.2 11.5 - 15.5 %   Platelets 231 150 - 400 K/uL  Comprehensive metabolic panel     Status: Abnormal   Collection Time: 01/29/15  6:03 AM  Result Value Ref Range   Sodium 139 135 - 145 mmol/L   Potassium 3.7 3.5 - 5.1 mmol/L   Chloride 103 101 - 111 mmol/L   CO2 29 22 - 32 mmol/L   Glucose, Bld 86 70 - 99 mg/dL   BUN 12 6 - 20 mg/dL   Creatinine, Ser 0.40 (L) 0.44 - 1.00 mg/dL   Calcium 9.3 8.9 - 10.3 mg/dL   Total Protein 6.0 (L) 6.5 - 8.1 g/dL   Albumin 3.4 (L) 3.5 - 5.0 g/dL   AST 18 15 - 41 U/L   ALT 14 14 - 54 U/L   Alkaline Phosphatase 55 38 - 126 U/L   Total Bilirubin 0.4 0.3 - 1.2 mg/dL   GFR calc non Af Amer >60 >60 mL/min   GFR calc Af Amer >60 >60 mL/min    Comment: (NOTE) The eGFR has been calculated using the CKD EPI equation. This calculation has not been validated in all clinical situations. eGFR's persistently <  60 mL/min signify possible Chronic Kidney Disease.     Anion gap 7 5 - 15   PHENOBARBITAL 15  Studies/Results: HEAD/FACIAL/CPSINE CT 1. No acute intracranial process. 2. Central forehead contusion. 3. Cerebellar atrophy.  CT MAXILLOFACIAL:  Limited study due to motion with no definite acute maxillofacial injury identified.  CT CERVICAL SPINE:  Somewhat limited study due to motion. No definite acute traumatic injury within the cervical spine.     Bruce Mayers A. Christine Pena, M.D.  Diplomate, Tax adviser of Psychiatry and Neurology ( Neurology). 01/30/2015, 8:33 AM

## 2015-01-30 NOTE — Progress Notes (Signed)
UR chart review completed.  

## 2015-01-30 NOTE — Clinical Social Work Note (Signed)
Pt's brother, [name] returned call. He is aware and agreeable to d/c back to [facility] via [facility]. He plans to request meeting at [facility] to discuss pt's progress and medications.  Derenda FennelKara Alper Guilmette, LCSW [phone]

## 2015-01-30 NOTE — Care Management Note (Signed)
Case Management Note  Patient Details  Name: Christine EagleDebra L Pena MRN: [id number] Date of Birth: 02-Sep-1956  Subjective/Objective:                  Pt admitted from [facility] with seizures. Anticipate pt will return to facility at discharge.  Action/Plan: CSW is aware and will arrange discharge to facility when medically stable. Anticipate discharge within 24 hours.  Expected Discharge Date:  02/02/15               Expected Discharge Plan:  Skilled Nursing Facility  In-House Referral:  Clinical Social Work  Discharge planning Services  CM Consult  Post Acute Care Choice:  NA Choice offered to:  NA  DME Arranged:    DME Agency:     HH Arranged:    HH Agency:     Status of Service:  Completed, signed off  Medicare Important Message Given:  No Date Medicare IM Given:    Medicare IM give by:    Date Additional Medicare IM Given:    Additional Medicare Important Message give by:     If discussed at Long Length of Stay Meetings, dates discussed:    Additional Comments:  Cheryl FlashBlackwell, Sibley Rolison Crowder, RN 01/30/2015, 11:20 AM

## 2015-01-30 NOTE — Clinical Social Work Note (Signed)
Clinical Social Work Assessment  Patient Details  Name: Christine Pena MRN: [id number] Date of Birth: [birth year]  Date of referral:  01/30/15               Reason for consult:  Facility Placement                Permission sought to share information with:    Permission granted to share information::     Name::        Agency::     Relationship::     Contact Information:     Housing/Transportation Living arrangements for the past 2 months:  [facility] Source of Information:  Facility Patient Interpreter Needed:  None Criminal Activity/Legal Involvement Pertinent to Current Situation/Hospitalization:  No - Comment as needed Significant Relationships:  Siblings, Parents Lives with:  Facility Resident Do you feel safe going back to the place where you live?  Yes Need for family participation in patient care:  Yes (Comment)  Care giving concerns:  Pt is from SNF.    Social Worker assessment / plan:  Pt has been a resident at [facility] for about 2 months. Her mother, Christine Pena was guardian as of last admission. Pt has intellectual disability. Christine P[facility] is [facility]. CSW attempted to reach her, but she was playing bingo. Requested her RN to have pt return call. CSW also left voicemails for both brothers listed on chart, [name] and [name] to notify them of d/c today. Per [name] at facility, pt requires extensive assist with ADLs and a lot of cues. Okay to return. D/C today and d/c summary faxed. Will transport via [facility].  Employment status:  Disabled (Comment on whether or not currently receiving Disability) Insurance information:  Teacher, English as a foreign languageManaged Medicare, [place] PT Recommendations:  Not assessed at this time Information / Referral to community resources:  Skilled Nursing Facility  Patient/Family's Response to care:  Unable to reach any family.   Patient/Family's Understanding of and Emotional Response to Diagnosis, Current Treatment, and  Prognosis:  Pt is oriented to self only. Left voicemails for family.   Emotional Assessment Appearance:  Other (Comment Required (not assessed) Attitude/Demeanor/Rapport:  Unable to Assess Affect (typically observed):  Unable to Assess Orientation:  Oriented to Self Alcohol / Substance use:  Not Applicable Psych involvement (Current and /or in the community):  No (Comment)  Discharge Needs  Concerns to be addressed:  No discharge needs identified Readmission within the last 30 days:  No Current discharge risk:  Cognitively Impaired Barriers to Discharge:  No Barriers Identified   [name], [name], LCSW 01/30/2015, 2:53 PM [phone]

## 2015-01-30 NOTE — Progress Notes (Signed)
Report called to Jordan Hawksaylor Shelton at [facility].  She verbalized understanding and is prepared to accept the patient back to the unit.  REMS arrived to transport the patient back to the unit.  The patient's IV was d/c'd by the patient.  The site was WNL except there is some swelling in that extremity.  The patient was transferred over via stretcher with packet in stable condition.

## 2015-01-30 NOTE — Progress Notes (Signed)
EEG Completed; Results Pending  

## 2015-01-30 NOTE — Discharge Summary (Signed)
Physician Discharge Summary  Gareth EagleDebra L Sadek MVH:[id number] DOB: 1956-06-12 DOA: 01/28/2015  PCP: [name], [name], MD  Admit date: 01/28/2015 Discharge date: 01/30/2015  Time spent: 40 minutes  Recommendations for Outpatient Follow-up:  1. Follow up with Dr Gerilyn Pilgrimoonquah  In 1 week to follow EEG 2. Follow up with PCP 1-2 weeks for evaluation of symptoms and staple removal 3. Discharge back to facility  Discharge Diagnoses:  Active Problems:   Mental retardation   Seizure   Hypertension   HLD (hyperlipidemia)   Fall   Discharge Condition: stable  Diet recommendation: regular  Filed Weights   01/29/15 0201 01/30/15 [id number]  Weight: 92.216 kg (203 lb 4.8 oz) 92.08 kg (203 lb)    History of present illness:  59 year old female who  has a past medical history of Hypertension; Seizures; and Mental retardation. who was brought to the ED on 01/29/15 from [facility] after patient was found on the floor in her room with laceration to forehead and large hematoma. In the ED patient had 2 episodes of seizure lasting 10-15 seconds each. Patient was given 1 g of Keppra and her home medications Lamictal 50 kg by mouth 1. Patient poor historian due to mental retardation, though she denies any pain, no dysuria, no fever, no nausea vomiting.   Hospital Course:  Seizure disorder Patient had 2 episodes in the ED and one possible seizure at the skilled facility. Phenobarbital level low end of range.  1 g of Keppra given the ED, patient usually takes 500 mg of Keppra twice a day. No further seizure activity. Evaluated by neurology who recommend increasing Keppra to 1000 BID and increasing lamictal to 75mg  BID and to continue phenobarbital 129.6 mg at bedtime. EEG obtained and will be followed by neurology. At discharge patient alert and at baseline.   Hypertension: controlled during this hospitalization  Mental retardation: at baseline.     Procedures:  EEG  Consultations:  neurology  Discharge Exam: Filed Vitals:   01/30/15 0633  BP: 118/68  Pulse: 71  Temp: 98.7 F (37.1 C)  Resp: 20    General: obese appears comfortabled Cardiovascular: RRR No MGR No  Respiratory: normal effort no wheeze, rhonchi Neuro: alert follows commands responds to questions appropriately  Discharge Instructions    Current Discharge Medication List    CONTINUE these medications which have CHANGED   Details  lamoTRIgine (LAMICTAL) 25 MG tablet Take 3 tablets (75 mg total) by mouth 2 (two) times daily. Qty: 90 tablet, Refills: 0      CONTINUE these medications which have NOT CHANGED   Details  chlorhexidine (PERIDEX) 0.12 % solution Use as directed 10 mLs in the mouth or throat 4 (four) times daily.    hydrochlorothiazide (MICROZIDE) 12.5 MG capsule Take 12.5 mg by mouth daily.    hydrocortisone (ANUSOL-HC) 2.5 % rectal cream Place 1 application rectally 2 (two) times daily as needed for hemorrhoids.     levETIRAcetam (KEPPRA) 500 MG tablet Take 1 tablet (500 mg total) by mouth 2 (two) times daily. Qty: 60 tablet, Refills: 0    LORazepam (ATIVAN) 2 MG/ML injection Inject 1 mg into the vein as needed.    moexipril (UNIVASC) 7.5 MG tablet Take 7.5 mg by mouth daily.     oxybutynin (DITROPAN) 5 MG tablet Take 5 mg by mouth daily.     paliperidone (INVEGA) 6 MG 24 hr tablet Take 6 mg by mouth daily.    PHENobarbital (LUMINAL) 64.8 MG tablet Take 2 tablets (129.6  mg total) by mouth at bedtime. Qty: 30 tablet, Refills: 0    QUEtiapine (SEROQUEL) 50 MG tablet Take 1 tablet (50 mg total) by mouth at bedtime. Qty: 30 tablet, Refills: 1    simvastatin (ZOCOR) 20 MG tablet Take 20 mg by mouth at bedtime.     Vitamin D, Ergocalciferol, (DRISDOL) 50000 UNITS CAPS capsule Take 50,000 Units by mouth every 7 (seven) days. Takes on Saturdays.    vitamin E 200 UNIT capsule Take 200 Units by mouth daily.       Allergies   Allergen Reactions  . Depakote [Valproic Acid]     altered mental status/ high ammonia  . Haldol [Haloperidol Lactate]     Unknown reaction.      The results of significant diagnostics from this hospitalization (including imaging, microbiology, ancillary and laboratory) are listed below for reference.    Significant Diagnostic Studies: Dg Wrist Complete Left  01/29/2015   CLINICAL DATA:  Fall. LEFT wrist pain. Swelling and bruising. Fracture 2 years ago.  EXAM: LEFT WRIST - COMPLETE 3+ VIEW  COMPARISON:  None.  FINDINGS: Healed fracture of the distal radial metaphysis. No acute fracture is identified. Scaphoid bone appears intact. Carpal alignment and spacing appears within normal limits. 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  IMPRESSION: 1. No acute osseous abnormality. 2. Healed distal radius fracture. 3. 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   Electronically Signed   By: Andreas NewportGeoffrey  Lamke M.D.   On: 01/29/2015 11:54   Ct Head Wo Contrast  01/28/2015   CLINICAL DATA:  Initial evaluation for acute trauma, forehead contusion.  EXAM: CT HEAD WITHOUT CONTRAST  CT MAXILLOFACIAL WITHOUT CONTRAST  CT CERVICAL SPINE WITHOUT CONTRAST  TECHNIQUE: Multidetector CT imaging of the head, cervical spine, and maxillofacial structures were performed using the standard protocol without intravenous contrast. Multiplanar CT image reconstructions of the cervical spine and maxillofacial structures were also generated.  COMPARISON:  Prior study from 11/24/2014.  FINDINGS: CT HEAD FINDINGS  Study is somewhat degraded by motion artifact.  Cerebellar atrophy noted. Mild chronic small vessel ischemic changes. No acute intracranial hemorrhage or large vessel territory infarct. No extra-axial fluid collection. No mass lesion or midline shift. No hydrocephalus.  Contusion present at the central forehead. Scalp soft tissues otherwise unremarkable. No acute  abnormality about the orbits.  Calvarium intact.  CT MAXILLOFACIAL FINDINGS  Study is severely limited by motion artifact.  Globes are intact. No retro-orbital pathology. Bony orbits are grossly intact without definite orbital floor fracture. No intraorbital gas to suggest orbital fracture.  Zygomatic arches grossly intact. Mandible grossly intact. No definite maxillary fracture. Irregularity about the nasal bones favored to be chronic in nature with no definite overlying soft tissue swelling. Pterygoid plates intact.  No significant soft tissue swelling seen elsewhere within the face. Paranasal sinuses are clear. No mastoid effusion.  CT CERVICAL SPINE FINDINGS  Studies limited by motion artifact.  Vertebral bodies are normally aligned with preservation of the normal cervical lordosis. Vertebral body heights grossly maintained. Evaluation of the dens is somewhat limited by motion. No definite fracture. No listhesis. Prevertebral soft tissues are normal.  No acute soft tissue abnormality within the neck. Visualized lungs are clear.  IMPRESSION: CT HEAD:  1. No acute intracranial process. 2. Central forehead contusion. 3. Cerebellar atrophy.  CT MAXILLOFACIAL:  Limited study due to motion with no definite acute maxillofacial injury identified.  CT CERVICAL SPINE:  Somewhat limited study due to  motion. No definite acute traumatic injury within the cervical spine.   Electronically Signed   By: Rise Mu M.D.   On: 01/28/2015 22:13   Ct Cervical Spine Wo Contrast  01/28/2015   CLINICAL DATA:  Initial evaluation for acute trauma, forehead contusion.  EXAM: CT HEAD WITHOUT CONTRAST  CT MAXILLOFACIAL WITHOUT CONTRAST  CT CERVICAL SPINE WITHOUT CONTRAST  TECHNIQUE: Multidetector CT imaging of the head, cervical spine, and maxillofacial structures were performed using the standard protocol without intravenous contrast. Multiplanar CT image reconstructions of the cervical spine and maxillofacial structures were  also generated.  COMPARISON:  Prior study from 11/24/2014.  FINDINGS: CT HEAD FINDINGS  Study is somewhat degraded by motion artifact.  Cerebellar atrophy noted. Mild chronic small vessel ischemic changes. No acute intracranial hemorrhage or large vessel territory infarct. No extra-axial fluid collection. No mass lesion or midline shift. No hydrocephalus.  Contusion present at the central forehead. Scalp soft tissues otherwise unremarkable. No acute abnormality about the orbits.  Calvarium intact.  CT MAXILLOFACIAL FINDINGS  Study is severely limited by motion artifact.  Globes are intact. No retro-orbital pathology. Bony orbits are grossly intact without definite orbital floor fracture. No intraorbital gas to suggest orbital fracture.  Zygomatic arches grossly intact. Mandible grossly intact. No definite maxillary fracture. Irregularity about the nasal bones favored to be chronic in nature with no definite overlying soft tissue swelling. Pterygoid plates intact.  No significant soft tissue swelling seen elsewhere within the face. Paranasal sinuses are clear. No mastoid effusion.  CT CERVICAL SPINE FINDINGS  Studies limited by motion artifact.  Vertebral bodies are normally aligned with preservation of the normal cervical lordosis. Vertebral body heights grossly maintained. Evaluation of the dens is somewhat limited by motion. No definite fracture. No listhesis. Prevertebral soft tissues are normal.  No acute soft tissue abnormality within the neck. Visualized lungs are clear.  IMPRESSION: CT HEAD:  1. No acute intracranial process. 2. Central forehead contusion. 3. Cerebellar atrophy.  CT MAXILLOFACIAL:  Limited study due to motion with no definite acute maxillofacial injury identified.  CT CERVICAL SPINE:  Somewhat limited study due to motion. No definite acute traumatic injury within the cervical spine.   Electronically Signed   By: Rise Mu M.D.   On: 01/28/2015 22:13   Ct Maxillofacial Wo  Cm  01/28/2015   CLINICAL DATA:  Initial evaluation for acute trauma, forehead contusion.  EXAM: CT HEAD WITHOUT CONTRAST  CT MAXILLOFACIAL WITHOUT CONTRAST  CT CERVICAL SPINE WITHOUT CONTRAST  TECHNIQUE: Multidetector CT imaging of the head, cervical spine, and maxillofacial structures were performed using the standard protocol without intravenous contrast. Multiplanar CT image reconstructions of the cervical spine and maxillofacial structures were also generated.  COMPARISON:  Prior study from 11/24/2014.  FINDINGS: CT HEAD FINDINGS  Study is somewhat degraded by motion artifact.  Cerebellar atrophy noted. Mild chronic small vessel ischemic changes. No acute intracranial hemorrhage or large vessel territory infarct. No extra-axial fluid collection. No mass lesion or midline shift. No hydrocephalus.  Contusion present at the central forehead. Scalp soft tissues otherwise unremarkable. No acute abnormality about the orbits.  Calvarium intact.  CT MAXILLOFACIAL FINDINGS  Study is severely limited by motion artifact.  Globes are intact. No retro-orbital pathology. Bony orbits are grossly intact without definite orbital floor fracture. No intraorbital gas to suggest orbital fracture.  Zygomatic arches grossly intact. Mandible grossly intact. No definite maxillary fracture. Irregularity about the nasal bones favored to be chronic in nature with no definite overlying soft  tissue swelling. Pterygoid plates intact.  No significant soft tissue swelling seen elsewhere within the face. Paranasal sinuses are clear. No mastoid effusion.  CT CERVICAL SPINE FINDINGS  Studies limited by motion artifact.  Vertebral bodies are normally aligned with preservation of the normal cervical lordosis. Vertebral body heights grossly maintained. Evaluation of the dens is somewhat limited by motion. No definite fracture. No listhesis. Prevertebral soft tissues are normal.  No acute soft tissue abnormality within the neck. Visualized lungs are  clear.  IMPRESSION: CT HEAD:  1. No acute intracranial process. 2. Central forehead contusion. 3. Cerebellar atrophy.  CT MAXILLOFACIAL:  Limited study due to motion with no definite acute maxillofacial injury identified.  CT CERVICAL SPINE:  Somewhat limited study due to motion. No definite acute traumatic injury within the cervical spine.   Electronically Signed   By: Rise Mu M.D.   On: 01/28/2015 22:13    Microbiology: Recent Results (from the past 240 hour(s))  MRSA PCR Screening     Status: Abnormal   Collection Time: 01/29/15  3:30 AM  Result Value Ref Range Status   MRSA by PCR POSITIVE (A) NEGATIVE Final    Comment:        The GeneXpert MRSA Assay (FDA approved for NASAL specimens only), is one component of a comprehensive MRSA colonization surveillance program. It is not intended to diagnose MRSA infection nor to guide or monitor treatment for MRSA infections. RESULT CALLED TO, READ BACK BY AND VERIFIED WITH: L.FORTE AT 0911 ON 01/29/15 BY S.VANHOORNE      Labs: Basic Metabolic Panel:  Recent Labs Lab 01/29/15 0007 01/29/15 0603  NA 135 139  K 4.1 3.7  CL 98* 103  CO2  --  29  GLUCOSE 137* 86  BUN 15 12  CREATININE 0.60 0.40*  CALCIUM  --  9.3   Liver Function Tests:  Recent Labs Lab 01/29/15 0603  AST 18  ALT 14  ALKPHOS 55  BILITOT 0.4  PROT 6.0*  ALBUMIN 3.4*   No results for input(s): LIPASE, AMYLASE in the last 168 hours. No results for input(s): AMMONIA in the last 168 hours. CBC:  Recent Labs Lab 01/29/15 0007 01/29/15 0603  WBC  --  8.3  HGB 12.2 11.7*  HCT 36.0 34.7*  MCV  --  85.9  PLT  --  231   Cardiac Enzymes: No results for input(s): CKTOTAL, CKMB, CKMBINDEX, TROPONINI in the last 168 hours. BNP: BNP (last 3 results) No results for input(s): BNP in the last 8760 hours.  ProBNP (last 3 results) No results for input(s): PROBNP in the last 8760 hours.  CBG: No results for input(s): GLUCAP in the last 168  hours.     SignedGwenyth Bender  [facility] 01/30/2015, 11:21 AM

## 2015-01-31 DIAGNOSIS — S0181XA Laceration without foreign body of other part of head, initial encounter: Secondary | ICD-10-CM | POA: Diagnosis not present

## 2015-01-31 DIAGNOSIS — F79 Unspecified intellectual disabilities: Secondary | ICD-10-CM | POA: Diagnosis not present

## 2015-01-31 DIAGNOSIS — R569 Unspecified convulsions: Secondary | ICD-10-CM | POA: Diagnosis not present

## 2015-01-31 DIAGNOSIS — I10 Essential (primary) hypertension: Secondary | ICD-10-CM | POA: Diagnosis not present

## 2015-02-01 DIAGNOSIS — E785 Hyperlipidemia, unspecified: Secondary | ICD-10-CM | POA: Diagnosis not present

## 2015-02-01 DIAGNOSIS — D649 Anemia, unspecified: Secondary | ICD-10-CM | POA: Diagnosis not present

## 2015-02-01 DIAGNOSIS — I10 Essential (primary) hypertension: Secondary | ICD-10-CM | POA: Diagnosis not present

## 2015-02-01 DIAGNOSIS — R569 Unspecified convulsions: Secondary | ICD-10-CM | POA: Diagnosis not present

## 2015-02-01 DIAGNOSIS — R4182 Altered mental status, unspecified: Secondary | ICD-10-CM | POA: Diagnosis not present

## 2015-02-01 DIAGNOSIS — Z79899 Other long term (current) drug therapy: Secondary | ICD-10-CM | POA: Diagnosis not present

## 2015-02-01 DIAGNOSIS — G40001 Localization-related (focal) (partial) idiopathic epilepsy and epileptic syndromes with seizures of localized onset, not intractable, with status epilepticus: Secondary | ICD-10-CM | POA: Diagnosis not present

## 2015-02-01 DIAGNOSIS — F79 Unspecified intellectual disabilities: Secondary | ICD-10-CM | POA: Diagnosis not present

## 2015-02-05 NOTE — Procedures (Signed)
  [facility] Christine Forrer A. Gerilyn Pilgrimoonquah, MD     [email or web address]           HISTORY: The patient presents with seizures. There is history of multiple seizures and status epilepticus.  MEDICATIONS: Scheduled Meds: Continuous Infusions: PRN Meds:.  Prior to Admission medications   Medication Sig Start Date End Date Taking? Authorizing Provider  chlorhexidine (PERIDEX) 0.12 % solution Use as directed 10 mLs in the mouth or throat 4 (four) times daily.    Historical Provider, MD  hydrochlorothiazide (MICROZIDE) 12.5 MG capsule Take 12.5 mg by mouth daily.    Historical Provider, MD  hydrocortisone (ANUSOL-HC) 2.5 % rectal cream Place 1 application rectally 2 (two) times daily as needed for hemorrhoids.     Historical Provider, MD  lamoTRIgine (LAMICTAL) 25 MG tablet Take 3 tablets (75 mg total) by mouth 2 (two) times daily. 01/30/15   Gwenyth BenderKaren M Black, NP  levETIRAcetam (KEPPRA) 1000 MG tablet Take 1 tablet (1,000 mg total) by mouth 2 (two) times daily. 01/30/15   Erick BlinksJehanzeb Memon, MD  LORazepam (ATIVAN) 2 MG/ML injection Inject 1 mg into the vein as needed.    Historical Provider, MD  moexipril (UNIVASC) 7.5 MG tablet Take 7.5 mg by mouth daily.  12/30/13   Historical Provider, MD  oxybutynin (DITROPAN) 5 MG tablet Take 5 mg by mouth daily.  12/30/13   Historical Provider, MD  paliperidone (INVEGA) 6 MG 24 hr tablet Take 6 mg by mouth daily.    Historical Provider, MD  PHENobarbital (LUMINAL) 64.8 MG tablet Take 2 tablets (129.6 mg total) by mouth at bedtime. 10/11/14   Gwenyth BenderKaren M Black, NP  QUEtiapine (SEROQUEL) 50 MG tablet Take 1 tablet (50 mg total) by mouth at bedtime. 10/11/14   Gwenyth BenderKaren M Black, NP  simvastatin (ZOCOR) 20 MG tablet Take 20 mg by mouth at bedtime.  12/30/13   Historical Provider, MD  Vitamin D, Ergocalciferol, (DRISDOL) 50000 UNITS CAPS capsule Take 50,000 Units by mouth every 7 (seven) days. Takes on Saturdays.    Historical Provider, MD  vitamin E 200 UNIT capsule Take 200 Units by  mouth daily.    Historical Provider, MD      ANALYSIS: A 16 channel recording using standard 10 20 measurements is conducted for 20 minutes. There is a well-formed posterior dominant rhythm of 9-1/2 Hz to 10 Hz which attenuates with eye opening. There is beta activity observed in the frontal areas. Awake and sleep activities are observed. K complexes and sleep spindles observed. Photic simulation is carried out without abnormal changes in the background activity. The patient has infrequent frontal intermittent rhythmic delta activities. There is no focal or lateral slowing. There is no epileptiform activity observed.   IMPRESSION: 1. This recording shows infrequent frontal intermittent rhythmic delta activities. This is often associated with metabolic encephalopathies. No epileptiform activities are observed.      Emmer Lillibridge A. Gerilyn Pena, M.D.  Diplomate, Biomedical engineerAmerican Board of Psychiatry and Neurology ( Neurology).

## 2015-02-09 ENCOUNTER — Encounter: Payer: Self-pay | Admitting: Obstetrics and Gynecology

## 2015-02-09 ENCOUNTER — Ambulatory Visit (INDEPENDENT_AMBULATORY_CARE_PROVIDER_SITE_OTHER): Payer: Medicare Other | Admitting: Obstetrics and Gynecology

## 2015-02-09 VITALS — BP 120/82

## 2015-02-09 DIAGNOSIS — N950 Postmenopausal bleeding: Secondary | ICD-10-CM

## 2015-02-09 NOTE — Progress Notes (Addendum)
Patient ID: Christine EagleDebra L Olarte, female   DOB: July 04, 1956, 59 y.o.   MRN: [id number]   [facility]  Patient name: Christine Pena MRN [id number]  Date of birth: July 04, 1956  CC & HPI:  Christine Pena is a 59 y.o. female from [facility] nursing [facility] presenting today for vaginal bleeding. Her caretaker is unsure how long the vaginal bleeding has occurred or whether her bleeding has stopped. Her caretaker states there was some spotting on her underwear. Patient also fell "a while" ago and struck her forehead.   ROS:  Unable to perform ROS due to pt refusal to cooperate.   Pt has family in the area but does not listen to them. Mother [name] is in [facility] at [phone] . Brother Casimiro NeedleMichael [phone]. Brother [name] [phone].   Pertinent History Reviewed:   Reviewed: Significant for n/a Medical         Past Medical History  Diagnosis Date  . Hypertension   . Seizures   . Mental retardation   . Hyperlipidemia   . Anxiety                               Surgical Hx:   No past surgical history on file. Medications: Reviewed & Updated - see associated section                       Current outpatient prescriptions:  .  chlorhexidine (PERIDEX) 0.12 % solution, Use as directed 10 mLs in the mouth or throat 4 (four) times daily., Disp: , Rfl:  .  hydrochlorothiazide (MICROZIDE) 12.5 MG capsule, Take 12.5 mg by mouth daily., Disp: , Rfl:  .  hydrocortisone (ANUSOL-HC) 2.5 % rectal cream, Place 1 application rectally 2 (two) times daily as needed for hemorrhoids. , Disp: , Rfl:  .  lamoTRIgine (LAMICTAL) 25 MG tablet, Take 3 tablets (75 mg total) by mouth 2 (two) times daily., Disp: 90 tablet, Rfl: 0 .  levETIRAcetam (KEPPRA) 1000 MG tablet, Take 1 tablet (1,000 mg total) by mouth 2 (two) times daily., Disp: 60 tablet, Rfl: 1 .  LORazepam (ATIVAN) 2 MG/ML injection, Inject 1 mg into the vein as needed., Disp: , Rfl:  .  moexipril (UNIVASC) 7.5 MG tablet, Take 7.5 mg by  mouth daily. , Disp: , Rfl:  .  oxybutynin (DITROPAN) 5 MG tablet, Take 5 mg by mouth daily. , Disp: , Rfl:  .  paliperidone (INVEGA) 6 MG 24 hr tablet, Take 6 mg by mouth daily., Disp: , Rfl:  .  PHENobarbital (LUMINAL) 64.8 MG tablet, Take 2 tablets (129.6 mg total) by mouth at bedtime., Disp: 30 tablet, Rfl: 0 .  QUEtiapine (SEROQUEL) 50 MG tablet, Take 1 tablet (50 mg total) by mouth at bedtime., Disp: 30 tablet, Rfl: 1 .  simvastatin (ZOCOR) 20 MG tablet, Take 20 mg by mouth at bedtime. , Disp: , Rfl:  .  Vitamin D, Ergocalciferol, (DRISDOL) 50000 UNITS CAPS capsule, Take 50,000 Units by mouth every 7 (seven) days. Takes on Saturdays., Disp: , Rfl:  .  vitamin E 200 UNIT capsule, Take 200 Units by mouth daily., Disp: , Rfl:    Social History: Reviewed -  reports that she has never smoked. She has never used smokeless tobacco.  Objective Findings:  Vitals: Blood pressure 120/82.  Physical Examination: General appearance - overweight and uncooperative Mental status - uncooperative  Patient  absolutely resists examination. Nursing home aware.   Assessment & Plan:   A:  1. Post-menopausal, perineal bleeding x 1.  P:  1. Unable to complete pelvic exam, pt refusal.  2. Will schedule for [facility]  3. If pt refuses, we will simply f/u prn recurrence and ask [facility] to call me to discuss before scheduling a visit.  This chart was SCRIBED for Christin BachJohn Dazia Lippold, MD by Ronney LionSuzanne Le, ED Scribe. This patient was seen in room 2 and the patient's care was started at 11:56 AM.  I personally performed the services described in this documentation, which was SCRIBED in my presence. The recorded information has been reviewed and considered accurate. It has been edited as necessary during review. Tilda BurrowFERGUSON,Crysta Gulick V, MD

## 2015-02-14 DIAGNOSIS — S0181XA Laceration without foreign body of other part of head, initial encounter: Secondary | ICD-10-CM | POA: Diagnosis not present

## 2015-02-14 DIAGNOSIS — E876 Hypokalemia: Secondary | ICD-10-CM | POA: Diagnosis not present

## 2015-02-14 DIAGNOSIS — R4182 Altered mental status, unspecified: Secondary | ICD-10-CM | POA: Diagnosis not present

## 2015-02-14 DIAGNOSIS — R601 Generalized edema: Secondary | ICD-10-CM | POA: Diagnosis not present

## 2015-02-14 DIAGNOSIS — R569 Unspecified convulsions: Secondary | ICD-10-CM | POA: Diagnosis not present

## 2015-02-14 DIAGNOSIS — G40911 Epilepsy, unspecified, intractable, with status epilepticus: Secondary | ICD-10-CM | POA: Diagnosis not present

## 2015-02-14 DIAGNOSIS — F919 Conduct disorder, unspecified: Secondary | ICD-10-CM | POA: Diagnosis not present

## 2015-02-14 DIAGNOSIS — Z79899 Other long term (current) drug therapy: Secondary | ICD-10-CM | POA: Diagnosis not present

## 2015-02-14 DIAGNOSIS — R270 Ataxia, unspecified: Secondary | ICD-10-CM | POA: Diagnosis not present

## 2015-02-15 ENCOUNTER — Encounter (HOSPITAL_COMMUNITY): Payer: Self-pay | Admitting: Emergency Medicine

## 2015-02-15 ENCOUNTER — Emergency Department (HOSPITAL_COMMUNITY): Payer: Medicare Other

## 2015-02-15 ENCOUNTER — Emergency Department (HOSPITAL_COMMUNITY)
Admission: EM | Admit: 2015-02-15 | Discharge: 2015-02-16 | Disposition: A | Discharge status: Home / Self Care | Payer: Medicare Other | Attending: Emergency Medicine | Admitting: Emergency Medicine

## 2015-02-15 DIAGNOSIS — R4182 Altered mental status, unspecified: Secondary | ICD-10-CM | POA: Diagnosis not present

## 2015-02-15 DIAGNOSIS — Z79899 Other long term (current) drug therapy: Secondary | ICD-10-CM | POA: Insufficient documentation

## 2015-02-15 DIAGNOSIS — I10 Essential (primary) hypertension: Secondary | ICD-10-CM | POA: Diagnosis not present

## 2015-02-15 DIAGNOSIS — E785 Hyperlipidemia, unspecified: Secondary | ICD-10-CM | POA: Diagnosis not present

## 2015-02-15 DIAGNOSIS — F79 Unspecified intellectual disabilities: Secondary | ICD-10-CM | POA: Diagnosis not present

## 2015-02-15 DIAGNOSIS — Z046 Encounter for general psychiatric examination, requested by authority: Secondary | ICD-10-CM | POA: Diagnosis present

## 2015-02-15 DIAGNOSIS — F419 Anxiety disorder, unspecified: Secondary | ICD-10-CM | POA: Diagnosis not present

## 2015-02-15 DIAGNOSIS — G40909 Epilepsy, unspecified, not intractable, without status epilepticus: Secondary | ICD-10-CM | POA: Diagnosis not present

## 2015-02-15 DIAGNOSIS — R918 Other nonspecific abnormal finding of lung field: Secondary | ICD-10-CM | POA: Diagnosis not present

## 2015-02-15 LAB — AMMONIA: AMMONIA: 18 umol/L (ref 9–35)

## 2015-02-15 LAB — COMPREHENSIVE METABOLIC PANEL
ALBUMIN: 3.8 g/dL (ref 3.5–5.0)
ALT: 11 U/L — AB (ref 14–54)
AST: 13 U/L — ABNORMAL LOW (ref 15–41)
Alkaline Phosphatase: 62 U/L (ref 38–126)
Anion gap: 6 (ref 5–15)
BILIRUBIN TOTAL: 0.6 mg/dL (ref 0.3–1.2)
BUN: 14 mg/dL (ref 6–20)
CALCIUM: 9.8 mg/dL (ref 8.9–10.3)
CO2: 28 mmol/L (ref 22–32)
CREATININE: 0.5 mg/dL (ref 0.44–1.00)
Chloride: 105 mmol/L (ref 101–111)
GFR calc Af Amer: >60 mL/min (ref >60)
GFR calc non Af Amer: >60 mL/min (ref >60)
GLUCOSE: 91 mg/dL (ref 65–99)
Potassium: 4.2 mmol/L (ref 3.5–5.1)
Sodium: 139 mmol/L (ref 135–145)
TOTAL PROTEIN: 6.4 g/dL — AB (ref 6.5–8.1)

## 2015-02-15 LAB — RAPID URINE DRUG SCREEN, HOSP PERFORMED
Amphetamines: NOT DETECTED
Barbiturates: POSITIVE — AB
Benzodiazepines: NOT DETECTED
Cocaine: NOT DETECTED
Opiates: NOT DETECTED
TETRAHYDROCANNABINOL: NOT DETECTED

## 2015-02-15 LAB — CBC WITH DIFFERENTIAL/PLATELET
Basophils Absolute: 0 10*3/uL (ref 0.0–0.1)
Basophils Relative: 1 % (ref 0–1)
EOS PCT: 0 % (ref 0–5)
Eosinophils Absolute: 0 10*3/uL (ref 0.0–0.7)
HCT: 35.4 % — ABNORMAL LOW (ref 36.0–46.0)
HEMOGLOBIN: 12 g/dL (ref 12.0–15.0)
Lymphocytes Relative: 33 % (ref 12–46)
Lymphs Abs: 1.7 10*3/uL (ref 0.7–4.0)
MCH: 29.2 pg (ref 26.0–34.0)
MCHC: 33.9 g/dL (ref 30.0–36.0)
MCV: 86.1 fL (ref 78.0–100.0)
MONO ABS: 0.3 10*3/uL (ref 0.1–1.0)
Monocytes Relative: 6 % (ref 3–12)
NEUTROS PCT: 60 % (ref 43–77)
Neutro Abs: 3.1 10*3/uL (ref 1.7–7.7)
Platelets: 257 10*3/uL (ref 150–400)
RBC: 4.11 MIL/uL (ref 3.87–5.11)
RDW: 12.1 % (ref 11.5–15.5)
WBC: 5.2 10*3/uL (ref 4.0–10.5)

## 2015-02-15 LAB — URINALYSIS, ROUTINE W REFLEX MICROSCOPIC
Bilirubin Urine: NEGATIVE
GLUCOSE, UA: NEGATIVE mg/dL
KETONES UR: NEGATIVE mg/dL
Leukocytes, UA: NEGATIVE
Nitrite: NEGATIVE
PROTEIN: NEGATIVE mg/dL
SPECIFIC GRAVITY, URINE: 1.015 (ref 1.005–1.030)
Urobilinogen, UA: 0.2 mg/dL (ref 0.0–1.0)
pH: 6 (ref 5.0–8.0)

## 2015-02-15 LAB — URINE MICROSCOPIC-ADD ON

## 2015-02-15 LAB — ETHANOL: Alcohol, Ethyl (B): <5 mg/dL (ref <5)

## 2015-02-15 LAB — PHENOBARBITAL LEVEL: PHENOBARBITAL: 20.1 ug/mL (ref 15.0–40.0)

## 2015-02-15 MED ORDER — PHENOBARBITAL 32.4 MG PO TABS
129.6000 mg | ORAL_TABLET | Freq: Every day | ORAL | Status: DC
  Administered 2015-02-15: 129.6 mg via ORAL
  Filled 2015-02-15: qty 4

## 2015-02-15 MED ORDER — LAMOTRIGINE 100 MG PO TABS
100.0000 mg | ORAL_TABLET | Freq: Every day | ORAL | Status: DC
  Administered 2015-02-15 – 2015-02-16 (×2): 100 mg via ORAL
  Filled 2015-02-15 (×3): qty 1

## 2015-02-15 MED ORDER — OXYBUTYNIN CHLORIDE 5 MG PO TABS
5.0000 mg | ORAL_TABLET | Freq: Every day | ORAL | Status: DC
  Administered 2015-02-15 – 2015-02-16 (×2): 5 mg via ORAL
  Filled 2015-02-15 (×3): qty 1

## 2015-02-15 MED ORDER — SIMVASTATIN 10 MG PO TABS
20.0000 mg | ORAL_TABLET | Freq: Every day | ORAL | Status: DC
  Administered 2015-02-15: 20 mg via ORAL
  Filled 2015-02-15: qty 2

## 2015-02-15 MED ORDER — QUETIAPINE FUMARATE 100 MG PO TABS
50.0000 mg | ORAL_TABLET | Freq: Every day | ORAL | Status: DC
  Administered 2015-02-15: 50 mg via ORAL
  Filled 2015-02-15: qty 1

## 2015-02-15 MED ORDER — TRANDOLAPRIL 2 MG PO TABS
1.0000 mg | ORAL_TABLET | Freq: Every day | ORAL | Status: DC
  Administered 2015-02-15 – 2015-02-16 (×2): 1 mg via ORAL
  Filled 2015-02-15 (×3): qty 0.5

## 2015-02-15 MED ORDER — VITAMIN D (ERGOCALCIFEROL) 1.25 MG (50000 UNIT) PO CAPS: 50000.0000 [IU] | ORAL_CAPSULE | ORAL | Status: DC

## 2015-02-15 MED ORDER — LEVETIRACETAM 500 MG PO TABS
1000.0000 mg | ORAL_TABLET | Freq: Two times a day (BID) | ORAL | Status: DC
  Administered 2015-02-15 – 2015-02-16 (×3): 1000 mg via ORAL
  Filled 2015-02-15 (×3): qty 2

## 2015-02-15 MED ORDER — HYDROCHLOROTHIAZIDE 12.5 MG PO CAPS
12.5000 mg | ORAL_CAPSULE | Freq: Every day | ORAL | Status: DC
  Administered 2015-02-15 – 2015-02-16 (×2): 12.5 mg via ORAL
  Filled 2015-02-15 (×3): qty 1
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
## 2015-02-15 NOTE — ED Notes (Signed)
Meal given
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
## 2015-02-15 NOTE — ED Notes (Signed)
Pt resting quietly at this time
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
## 2015-02-15 NOTE — Progress Notes (Signed)
Per [facility]rad [facility], patient does not meet inpatient criteria and she is social work placement.   [name], LCSWA Disposition staff 02/15/2015 5:18 PM

## 2015-02-15 NOTE — ED Notes (Signed)
Patient finishing meal at this time. Patient resting on stretcher.
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
## 2015-02-15 NOTE — ED Notes (Signed)
Called pharmacy to request daily medications
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
## 2015-02-15 NOTE — ED Notes (Signed)
Nurse from nursing home updated on pt status.

## 2015-02-15 NOTE — ED Notes (Signed)
Initially informed when patient arrived that [facility] stated they would not accept the patient back once discharged. After reading the [facility] assessment note stating recommendation of overnight observation and reassessment by psychiatry, I called [facility] to determine what to do in order to ensure patient had somewhere to go when discharge actually takes place, whether tomorrow or future. Spoke with Barnie AldermanMelissa Price ([facility]) and she advised me to inform [facility] that the patient would require a proper discharge, allowing a 30 day notice and proper placement. I then called and spoke to The director and AD at [facility]. They stated that they had stated that pt could not come back until "something was done with medications or some other kind of intervention" allowing for improvement of patients behavior due to the safety of other residents. They then began to ask about assessment criteria. I then referred them to [facility] for further questions since I am not able to answer for [facility] providers. Melissa Price from [facility] stated that if there is refusal of acceptance of the patient to the original facility at any time without proper discharge,  to call her tomorrow or whenever discharge takes place and if needed, she would be contacting the state.

## 2015-02-15 NOTE — ED Notes (Signed)
Pt placed in paper scrubs.  Nurse from [facility] at bedside.  Pt calm and cooperative at this time.  Pt only states "let me go". Does not answer any other questions.

## 2015-02-16 DIAGNOSIS — F919 Conduct disorder, unspecified: Secondary | ICD-10-CM | POA: Diagnosis not present

## 2015-02-16 DIAGNOSIS — Z743 Need for continuous supervision: Secondary | ICD-10-CM | POA: Diagnosis not present

## 2015-02-16 DIAGNOSIS — S0181XA Laceration without foreign body of other part of head, initial encounter: Secondary | ICD-10-CM | POA: Diagnosis not present

## 2015-02-16 DIAGNOSIS — R279 Unspecified lack of coordination: Secondary | ICD-10-CM | POA: Diagnosis not present

## 2015-02-16 NOTE — Discharge Instructions (Signed)
Follow up with your md °

## 2015-02-16 NOTE — Clinical Social Work Note (Addendum)
[facility] from [facility] called and facility is going to accept pt back. ED RN notified and will arrange transport. CSW notified pt's brother, [name] by [employer]. Spoke with pt's brother, [name] who is aware and agreeable.   Derenda FennelKara Asuncion Shibata, LCSW [phone]
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
## 2015-02-16 NOTE — ED Notes (Signed)
Attempted to awaken pt for breakfast.  Pt told NT to leave her alone and that she wanted to keep sleeping.  Pt easily aroused but is not talking at this time.  Will try later to wake up fully.

## 2015-02-16 NOTE — Progress Notes (Signed)
Spoke with [facility] Social Work, was informed [facility] has been contacted and requests psych notes be faxed to [phone] (initially requested to speak with provider, who is unavailable for phone call today). CSW faxed pt's psych consult notes to above number. Was told Misty Clinical research associate(administrator at [facility]) requested this Clinical research associatewriter call her at [phone]. CSW called and was told by reception that [facility] would be in meeting until afternoon and would return call if needed.  Ilean SkillMeghan Samual Beals, MSW, LCSWA Clinical Social Work, Disposition  02/16/2015 [phone]

## 2015-02-16 NOTE — ED Notes (Signed)
Pt awake and eating at this time

## 2015-02-16 NOTE — ED Notes (Signed)
Patient is asleep at this time. No distress noted.

## 2015-02-16 NOTE — Clinical Social Work Note (Signed)
CSW called Debbie at [facility] this morning after speaking with supervisor. Discussed that pt has been medically and psychiatrically cleared. Facility has not issued pt a 30 day notice. Debbie reports she understands they need to take pt back and was going to discuss with administrator [facility](Misty) there. CSW called back again this afternoon after Meghan, Disposition CSW faxed psych assessment for them to review. Eunice BlaseDebbie said that it had been given to [facility], but she had not heard anything else. CSW awaiting return call from [facility]. Facility is aware that the state will be contacted if they refuse pt's return.  Christine FennelKara Ailanie Ruttan, LCSW [phone]
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
## 2015-02-17 ENCOUNTER — Ambulatory Visit (HOSPITAL_COMMUNITY): Admission: RE | Admit: 2015-02-17 | Payer: Medicare Other | Source: Ambulatory Visit

## 2015-02-20 DIAGNOSIS — F919 Conduct disorder, unspecified: Secondary | ICD-10-CM | POA: Diagnosis not present

## 2015-02-20 DIAGNOSIS — Z9181 History of falling: Secondary | ICD-10-CM | POA: Diagnosis not present

## 2015-02-20 DIAGNOSIS — R569 Unspecified convulsions: Secondary | ICD-10-CM | POA: Diagnosis not present

## 2015-02-20 DIAGNOSIS — F39 Unspecified mood [affective] disorder: Secondary | ICD-10-CM | POA: Diagnosis not present

## 2015-02-20 DIAGNOSIS — R262 Difficulty in walking, not elsewhere classified: Secondary | ICD-10-CM | POA: Diagnosis not present

## 2015-02-20 DIAGNOSIS — N950 Postmenopausal bleeding: Secondary | ICD-10-CM | POA: Diagnosis not present

## 2015-02-20 DIAGNOSIS — F419 Anxiety disorder, unspecified: Secondary | ICD-10-CM | POA: Diagnosis not present

## 2015-02-20 DIAGNOSIS — N939 Abnormal uterine and vaginal bleeding, unspecified: Secondary | ICD-10-CM | POA: Diagnosis not present

## 2015-02-20 DIAGNOSIS — I10 Essential (primary) hypertension: Secondary | ICD-10-CM | POA: Diagnosis not present

## 2015-02-20 DIAGNOSIS — E785 Hyperlipidemia, unspecified: Secondary | ICD-10-CM | POA: Diagnosis not present

## 2015-02-20 DIAGNOSIS — M6281 Muscle weakness (generalized): Secondary | ICD-10-CM | POA: Diagnosis not present

## 2015-02-20 DIAGNOSIS — G40001 Localization-related (focal) (partial) idiopathic epilepsy and epileptic syndromes with seizures of localized onset, not intractable, with status epilepticus: Secondary | ICD-10-CM | POA: Diagnosis not present

## 2015-02-20 DIAGNOSIS — E559 Vitamin D deficiency, unspecified: Secondary | ICD-10-CM | POA: Diagnosis not present

## 2015-02-20 DIAGNOSIS — R2689 Other abnormalities of gait and mobility: Secondary | ICD-10-CM | POA: Diagnosis not present

## 2015-02-20 DIAGNOSIS — F79 Unspecified intellectual disabilities: Secondary | ICD-10-CM | POA: Diagnosis not present

## 2015-02-20 DIAGNOSIS — R4182 Altered mental status, unspecified: Secondary | ICD-10-CM | POA: Diagnosis not present

## 2015-02-21 ENCOUNTER — Ambulatory Visit (HOSPITAL_COMMUNITY)
Admission: RE | Admit: 2015-02-21 | Discharge: 2015-02-21 | Disposition: A | Discharge status: Home / Self Care | Payer: Medicare Other | Source: Ambulatory Visit | Attending: Obstetrics and Gynecology | Admitting: Obstetrics and Gynecology

## 2015-02-21 DIAGNOSIS — N950 Postmenopausal bleeding: Secondary | ICD-10-CM

## 2015-02-21 DIAGNOSIS — N939 Abnormal uterine and vaginal bleeding, unspecified: Secondary | ICD-10-CM | POA: Diagnosis not present

## 2015-02-26 DIAGNOSIS — E785 Hyperlipidemia, unspecified: Secondary | ICD-10-CM | POA: Diagnosis not present

## 2015-02-26 DIAGNOSIS — F79 Unspecified intellectual disabilities: Secondary | ICD-10-CM | POA: Diagnosis not present

## 2015-02-26 DIAGNOSIS — F919 Conduct disorder, unspecified: Secondary | ICD-10-CM | POA: Diagnosis not present

## 2015-02-26 DIAGNOSIS — E559 Vitamin D deficiency, unspecified: Secondary | ICD-10-CM | POA: Diagnosis not present

## 2015-03-06 ENCOUNTER — Emergency Department (HOSPITAL_COMMUNITY)
Admission: EM | Admit: 2015-03-06 | Discharge: 2015-03-06 | Disposition: A | Discharge status: Skilled Nursing Facility | Payer: Medicare Other | Attending: Emergency Medicine | Admitting: Emergency Medicine

## 2015-03-06 ENCOUNTER — Encounter (HOSPITAL_COMMUNITY): Payer: Self-pay | Admitting: *Deleted

## 2015-03-06 DIAGNOSIS — F79 Unspecified intellectual disabilities: Secondary | ICD-10-CM | POA: Diagnosis not present

## 2015-03-06 DIAGNOSIS — D649 Anemia, unspecified: Secondary | ICD-10-CM | POA: Diagnosis not present

## 2015-03-06 DIAGNOSIS — I10 Essential (primary) hypertension: Secondary | ICD-10-CM | POA: Insufficient documentation

## 2015-03-06 DIAGNOSIS — R569 Unspecified convulsions: Secondary | ICD-10-CM | POA: Diagnosis not present

## 2015-03-06 DIAGNOSIS — G40909 Epilepsy, unspecified, not intractable, without status epilepticus: Secondary | ICD-10-CM | POA: Insufficient documentation

## 2015-03-06 DIAGNOSIS — Z79899 Other long term (current) drug therapy: Secondary | ICD-10-CM | POA: Diagnosis not present

## 2015-03-06 DIAGNOSIS — R4182 Altered mental status, unspecified: Secondary | ICD-10-CM | POA: Diagnosis not present

## 2015-03-06 DIAGNOSIS — E785 Hyperlipidemia, unspecified: Secondary | ICD-10-CM | POA: Insufficient documentation

## 2015-03-06 DIAGNOSIS — F419 Anxiety disorder, unspecified: Secondary | ICD-10-CM | POA: Insufficient documentation

## 2015-03-06 LAB — CBC WITH DIFFERENTIAL/PLATELET
Basophils Absolute: 0 10*3/uL (ref 0.0–0.1)
Basophils Relative: 0 % (ref 0–1)
EOS ABS: 0 10*3/uL (ref 0.0–0.7)
EOS PCT: 0 % (ref 0–5)
HEMATOCRIT: 36.9 % (ref 36.0–46.0)
Hemoglobin: 12.8 g/dL (ref 12.0–15.0)
LYMPHS ABS: 1.4 10*3/uL (ref 0.7–4.0)
LYMPHS PCT: 22 % (ref 12–46)
MCH: 29.4 pg (ref 26.0–34.0)
MCHC: 34.7 g/dL (ref 30.0–36.0)
MCV: 84.8 fL (ref 78.0–100.0)
MONO ABS: 0.4 10*3/uL (ref 0.1–1.0)
MONOS PCT: 6 % (ref 3–12)
Neutro Abs: 4.4 10*3/uL (ref 1.7–7.7)
Neutrophils Relative %: 72 % (ref 43–77)
Platelets: 257 10*3/uL (ref 150–400)
RBC: 4.35 MIL/uL (ref 3.87–5.11)
RDW: 12.2 % (ref 11.5–15.5)
WBC: 6.2 10*3/uL (ref 4.0–10.5)

## 2015-03-06 LAB — URINALYSIS, ROUTINE W REFLEX MICROSCOPIC
Bilirubin Urine: NEGATIVE
Glucose, UA: NEGATIVE mg/dL
KETONES UR: NEGATIVE mg/dL
LEUKOCYTES UA: NEGATIVE
Nitrite: NEGATIVE
PROTEIN: 100 mg/dL — AB
Specific Gravity, Urine: 1.025 (ref 1.005–1.030)
Urobilinogen, UA: 0.2 mg/dL (ref 0.0–1.0)
pH: 7 (ref 5.0–8.0)

## 2015-03-06 LAB — BASIC METABOLIC PANEL
Anion gap: 11 (ref 5–15)
BUN: 11 mg/dL (ref 6–20)
CALCIUM: 9.7 mg/dL (ref 8.9–10.3)
CO2: 27 mmol/L (ref 22–32)
Chloride: 103 mmol/L (ref 101–111)
Creatinine, Ser: 0.46 mg/dL (ref 0.44–1.00)
GFR calc Af Amer: >60 mL/min (ref >60)
GLUCOSE: 120 mg/dL — AB (ref 65–99)
Potassium: 3.7 mmol/L (ref 3.5–5.1)
Sodium: 141 mmol/L (ref 135–145)

## 2015-03-06 LAB — URINE MICROSCOPIC-ADD ON

## 2015-03-06 LAB — I-STAT CG4 LACTIC ACID, ED: LACTIC ACID, VENOUS: 1.62 mmol/L (ref 0.5–2.0)

## 2015-03-06 MED ORDER — LORAZEPAM 2 MG/ML IJ SOLN
1.0000 mg | Freq: Once | INTRAMUSCULAR | Status: AC
  Administered 2015-03-06: 1 mg via INTRAVENOUS
  Filled 2015-03-06: qty 1

## 2015-03-06 NOTE — ED Notes (Signed)
Report called to [facility], pt back to baseline. Discharged back to facility. Transport via [employer].

## 2015-03-06 NOTE — Discharge Instructions (Signed)

## 2015-03-06 NOTE — ED Notes (Signed)
Rectal temp obtained, in and out cath completed. Pt cleaned and placed on dry chuck pads.

## 2015-03-06 NOTE — ED Provider Notes (Signed)
CSN: [id number]     Arrival date & time 03/06/15  0343 History   First MD Initiated Contact with Patient 03/06/15 0351     Chief Complaint  Patient presents with  . Seizures     (Consider location/radiation/quality/duration/timing/severity/associated sxs/prior Treatment) HPI  This a 59 year old female with a history of hypertension, seizures, mental retardation who presents following seizure-like activity. She comes by EMS from [facility].  Most of history is obtained by EMS. Per EMS, patient was up going to the restroom when her eyes rolled into the back of her head and she began to not respond.  Per EMS, they were called because her "postictal period was longer than normal." Unclear what the patient's baseline is. Currently she is very difficult to understand and I'm not able to obtain any history from her. She does appear to respond to my questions and is awake.  Per chart review, patient has difficult to control epilepsy. She is on Keppra, Lamictal, and phenobarbital. She has been admitted several times for status epilepticus.  Level V caveat  Past Medical History  Diagnosis Date  . Hypertension   . Seizures   . Mental retardation   . Hyperlipidemia   . Anxiety    History reviewed. No pertinent past surgical history. Family History  Problem Relation Age of Onset  . Heart disease Mother    History  Substance Use Topics  . Smoking status: Never Smoker   . Smokeless tobacco: Never Used  . Alcohol Use: No   OB History    No data available     Review of Systems  Unable to perform ROS: Mental status change      Allergies  Depakote and Haldol  Home Medications   Prior to Admission medications   Medication Sig Start Date End Date Taking? Authorizing Provider  chlorhexidine (PERIDEX) 0.12 % solution Use as directed 15 mLs in the mouth or throat 2 (two) times daily.   Yes Historical Provider, MD  hydrochlorothiazide (MICROZIDE) 12.5 MG capsule Take 12.5 mg by mouth daily.    Yes Historical Provider, MD  lamoTRIgine (LAMICTAL) 100 MG tablet Take 100 mg by mouth daily.   Yes Historical Provider, MD  levETIRAcetam (KEPPRA) 1000 MG tablet Take 1,000 mg by mouth 2 (two) times daily.   Yes Historical Provider, MD  LORazepam (ATIVAN) 2 MG/ML injection Inject 1 mg into the vein daily as needed for seizure.   Yes Historical Provider, MD  moexipril (UNIVASC) 7.5 MG tablet Take 7.5 mg by mouth daily.  12/30/13  Yes Historical Provider, MD  oxybutynin (DITROPAN) 5 MG tablet Take 5 mg by mouth daily.  12/30/13  Yes Historical Provider, MD  PHENobarbital (LUMINAL) 64.8 MG tablet Take 2 tablets (129.6 mg total) by mouth at bedtime. 10/11/14  Yes Lesle Chris Black, NP  QUEtiapine (SEROQUEL) 50 MG tablet Take 1 tablet (50 mg total) by mouth at bedtime. Patient taking differently: Take 50 mg by mouth 2 (two) times daily.  10/11/14  Yes Lesle Chris Black, NP  simvastatin (ZOCOR) 20 MG tablet Take 20 mg by mouth at bedtime.  12/30/13  Yes Historical Provider, MD  Vitamin D, Ergocalciferol, (DRISDOL) 50000 UNITS CAPS capsule Take 50,000 Units by mouth every 7 (seven) days. Takes on Saturdays.   Yes Historical Provider, MD  vitamin E (VITAMIN E) 200 UNIT capsule Take 200 Units by mouth daily.   Yes Historical Provider, MD  lamoTRIgine (LAMICTAL) 25 MG tablet Take 3 tablets (75 mg total) by mouth 2 (two) times  daily. Patient not taking: Reported on 02/15/2015 01/30/15   Gwenyth Bender, NP  levETIRAcetam (KEPPRA) 1000 MG tablet Take 1 tablet (1,000 mg total) by mouth 2 (two) times daily. Patient not taking: Reported on 02/15/2015 01/30/15   Erick Blinks, MD   BP 155/81 mmHg  Pulse 89  Temp(Src) 98.1 F (36.7 C) (Rectal)  Resp 26  SpO2 98% Physical Exam  Constitutional: No distress.  Chronically ill-appearing  HENT:  Head: Normocephalic and atraumatic.  Mucous membranes dry, holds mouth open  Eyes: Pupils are equal, round, and reactive to light.  Pupils 4 mm reactive bilaterally  Neck: Neck supple.   Cardiovascular: Normal rate, regular rhythm and normal heart sounds.   No murmur heard. Pulmonary/Chest: Effort normal and breath sounds normal. No respiratory distress. She has no wheezes.  Abdominal: Soft. Bowel sounds are normal. There is no tenderness. There is no rebound.  Musculoskeletal: She exhibits no edema.  Neurological: She is alert.  Appears to be oriented to herself, responds to simple questions but unintelligible, moves all 4 extremities  Skin: Skin is warm and dry.  Nursing note and vitals reviewed.   ED Course  Procedures (including critical care time) Labs Review Labs Reviewed  BASIC METABOLIC PANEL - Abnormal; Notable for the following:    Glucose, Bld 120 (*)    All other components within normal limits  URINALYSIS, ROUTINE W REFLEX MICROSCOPIC (NOT AT [facility]) - Abnormal; Notable for the following:    Hgb urine dipstick SMALL (*)    Protein, ur 100 (*)    All other components within normal limits  CBC WITH DIFFERENTIAL/PLATELET  URINE MICROSCOPIC-ADD ON  LAMOTRIGINE LEVEL  I-STAT CG4 LACTIC ACID, ED    Imaging Review No results found.   EKG Interpretation None      MDM   Final diagnoses:  Seizure    Patient presents following a seizure. Difficult to know what her baseline is but based on chart review, she seems to be oriented to herself at baseline and can answer simple yes or no questions. She is likely postictal on my initial evaluation. Otherwise nontoxic. Vital signs are reassuring. Protecting her airway.  Patient was given 1 mg of Ativan. Lactate is normal and basic labwork is reassuring. No evidence of metabolic derangement. Patient's mentation improved. On repeat exam, no evidence of status seizures. She is responsive and when asked if she would like to go back to her facility she not her head yes. Will discharge back to her living facility. Continue seizure medications as prescribed.  After history, exam, and medical workup I feel the patient  has been appropriately medically screened and is safe for discharge home. Pertinent diagnoses were discussed with the patient. Patient was given return precautions.     Shon Baton, MD 03/06/15 [id number]

## 2015-03-06 NOTE — ED Notes (Signed)
Pt arrived by EMS from [facility]. Reported pt up to restroom & then her eyes rolled back into head. Pt w/ a history of seizures.

## 2015-03-07 DIAGNOSIS — R601 Generalized edema: Secondary | ICD-10-CM | POA: Diagnosis not present

## 2015-03-07 DIAGNOSIS — N390 Urinary tract infection, site not specified: Secondary | ICD-10-CM | POA: Diagnosis not present

## 2015-03-07 DIAGNOSIS — R4182 Altered mental status, unspecified: Secondary | ICD-10-CM | POA: Diagnosis not present

## 2015-03-07 DIAGNOSIS — R569 Unspecified convulsions: Secondary | ICD-10-CM | POA: Diagnosis not present

## 2015-03-07 DIAGNOSIS — R319 Hematuria, unspecified: Secondary | ICD-10-CM | POA: Diagnosis not present

## 2015-03-07 DIAGNOSIS — I10 Essential (primary) hypertension: Secondary | ICD-10-CM | POA: Diagnosis not present

## 2015-03-07 LAB — LAMOTRIGINE LEVEL: Lamotrigine Lvl: NOT DETECTED ug/mL (ref 2.0–20.0)

## 2015-03-09 ENCOUNTER — Emergency Department (HOSPITAL_COMMUNITY)
Admission: EM | Admit: 2015-03-09 | Discharge: 2015-03-11 | Disposition: A | Discharge status: Other Acute Inpatient Hospital | Payer: Medicare Other | Attending: Emergency Medicine | Admitting: Emergency Medicine

## 2015-03-09 ENCOUNTER — Encounter (HOSPITAL_COMMUNITY): Payer: Self-pay | Admitting: Emergency Medicine

## 2015-03-09 DIAGNOSIS — Z046 Encounter for general psychiatric examination, requested by authority: Secondary | ICD-10-CM | POA: Diagnosis present

## 2015-03-09 DIAGNOSIS — I10 Essential (primary) hypertension: Secondary | ICD-10-CM | POA: Insufficient documentation

## 2015-03-09 DIAGNOSIS — Z79899 Other long term (current) drug therapy: Secondary | ICD-10-CM | POA: Insufficient documentation

## 2015-03-09 DIAGNOSIS — G40909 Epilepsy, unspecified, not intractable, without status epilepticus: Secondary | ICD-10-CM | POA: Insufficient documentation

## 2015-03-09 DIAGNOSIS — E785 Hyperlipidemia, unspecified: Secondary | ICD-10-CM | POA: Diagnosis not present

## 2015-03-09 DIAGNOSIS — R4182 Altered mental status, unspecified: Secondary | ICD-10-CM | POA: Diagnosis not present

## 2015-03-09 LAB — CBC WITH DIFFERENTIAL/PLATELET
Basophils Absolute: 0 10*3/uL (ref 0.0–0.1)
Basophils Relative: 0 % (ref 0–1)
Eosinophils Absolute: 0 10*3/uL (ref 0.0–0.7)
Eosinophils Relative: 0 % (ref 0–5)
HCT: 38.9 % (ref 36.0–46.0)
HEMOGLOBIN: 13.4 g/dL (ref 12.0–15.0)
LYMPHS ABS: 3.3 10*3/uL (ref 0.7–4.0)
Lymphocytes Relative: 39 % (ref 12–46)
MCH: 29.4 pg (ref 26.0–34.0)
MCHC: 34.4 g/dL (ref 30.0–36.0)
MCV: 85.3 fL (ref 78.0–100.0)
MONOS PCT: 7 % (ref 3–12)
Monocytes Absolute: 0.6 10*3/uL (ref 0.1–1.0)
NEUTROS ABS: 4.6 10*3/uL (ref 1.7–7.7)
NEUTROS PCT: 54 % (ref 43–77)
RBC: 4.56 MIL/uL (ref 3.87–5.11)
RDW: 12.4 % (ref 11.5–15.5)
WBC: 8.5 10*3/uL (ref 4.0–10.5)

## 2015-03-09 LAB — RAPID URINE DRUG SCREEN, HOSP PERFORMED
AMPHETAMINES: NOT DETECTED
BARBITURATES: POSITIVE — AB
BENZODIAZEPINES: NOT DETECTED
Cocaine: NOT DETECTED
Opiates: NOT DETECTED
TETRAHYDROCANNABINOL: NOT DETECTED

## 2015-03-09 LAB — BASIC METABOLIC PANEL
Anion gap: 7 (ref 5–15)
BUN: 16 mg/dL (ref 6–20)
CO2: 28 mmol/L (ref 22–32)
Calcium: 9.5 mg/dL (ref 8.9–10.3)
Chloride: 102 mmol/L (ref 101–111)
Creatinine, Ser: 0.6 mg/dL (ref 0.44–1.00)
GFR calc non Af Amer: >60 mL/min (ref >60)
GLUCOSE: 103 mg/dL — AB (ref 65–99)
POTASSIUM: 4 mmol/L (ref 3.5–5.1)
Sodium: 137 mmol/L (ref 135–145)

## 2015-03-09 LAB — ETHANOL
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
## 2015-03-09 NOTE — ED Notes (Signed)
Placed seizure padding on patient's bed.
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
## 2015-03-10 MED ORDER — LORAZEPAM 1 MG PO TABS: 1.0000 mg | ORAL_TABLET | Freq: Three times a day (TID) | ORAL | Status: DC | PRN

## 2015-03-10 MED ORDER — ACETAMINOPHEN 325 MG PO TABS: 650.0000 mg | ORAL_TABLET | ORAL | Status: DC | PRN

## 2015-03-10 MED ORDER — SIMVASTATIN 10 MG PO TABS
20.0000 mg | ORAL_TABLET | Freq: Every day | ORAL | Status: DC
  Administered 2015-03-10: 20 mg via ORAL
  Filled 2015-03-10: qty 2

## 2015-03-10 MED ORDER — LEVETIRACETAM 500 MG PO TABS
1000.0000 mg | ORAL_TABLET | Freq: Two times a day (BID) | ORAL | Status: DC
  Administered 2015-03-10 (×2): 1000 mg via ORAL
  Filled 2015-03-10 (×3): qty 2

## 2015-03-10 MED ORDER — OXYBUTYNIN CHLORIDE ER 5 MG PO TB24
5.0000 mg | ORAL_TABLET | Freq: Every day | ORAL | Status: DC
  Administered 2015-03-10: 5 mg via ORAL
  Filled 2015-03-10 (×2): qty 1

## 2015-03-10 MED ORDER — PHENOBARBITAL 32.4 MG PO TABS
97.2000 mg | ORAL_TABLET | Freq: Every day | ORAL | Status: DC
  Administered 2015-03-10: 97.2 mg via ORAL
  Filled 2015-03-10: qty 3

## 2015-03-10 MED ORDER — LAMOTRIGINE 100 MG PO TABS
100.0000 mg | ORAL_TABLET | Freq: Every day | ORAL | Status: DC
  Administered 2015-03-10: 100 mg via ORAL
  Filled 2015-03-10 (×2): qty 1

## 2015-03-10 MED ORDER — HYDROCHLOROTHIAZIDE 12.5 MG PO CAPS
12.5000 mg | ORAL_CAPSULE | Freq: Every day | ORAL | Status: DC
  Administered 2015-03-10: 12.5 mg via ORAL
  Filled 2015-03-10 (×2): qty 1

## 2015-03-10 MED ORDER — ONDANSETRON HCL 4 MG PO TABS: 4.0000 mg | ORAL_TABLET | Freq: Three times a day (TID) | ORAL | Status: DC | PRN

## 2015-03-10 NOTE — Progress Notes (Addendum)
Delorise Shiner from [facility] requested an EKG for patient. Patient's nurse informed. EKG papers were faxed to [facility]. Lupita Leash from [facility] stated patient is accepted but needs IVC papers to be faced first.  Per pt's nurse aware and IVC papers will be ready shortly. This Clinical research associate will follow up.  Melbourne Abts, LCSWA Disposition staff 03/10/2015 4:47 PM
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
## 2015-03-10 NOTE — Progress Notes (Signed)
Pt's case discussed with Dr. Lucianne Muss. Dr. Lucianne Muss reviewed pt's clinical information and she advises pt meets inpatient criteria and is in need of psychiatric placement. Placement will be sought on pt's behalf.  Ilean Skill, MSW, LCSWA Clinical Social Work, Disposition  03/10/2015 [phone]

## 2015-03-10 NOTE — Progress Notes (Signed)
Made contact with pt's brother who states pt's guardian is their mother, who lives in an assisted living facility and does not have access to any paperwork, guardianship, psychologicals, or otherwise. Debbie at [facility] they never received copies of either while pt resided in the facility, therefore MR diagnosis is not verified at this time, as IQ score results are unable to be located. Will continue seeking information.   Contacted [facility], where patient had been referred by [facility] staff per Eunice Blase. Left voicemail requesting returned call re: status of referral.  Seeking inpatient gero-psych placement. Referral made: [facility]- for waitlist- per Gypsy [facility]- per Waldemar Dickens- per Delorise Shiner  At capacity for gero units: [facility]- per April [facility]- per Milly [facility]- per [facility]- per Delfino Lovett- per Marylou Mccoy, MSW, [facility] Clinical Social Work, Disposition  03/10/2015 [phone]

## 2015-03-10 NOTE — Progress Notes (Signed)
Writer spoke with Christine Pena at [facility] and patient has a bed and will need IVC papers to be faxed at [facility].  RN at [facility] informed and to fax IVC papers to Fax#[phone].  CSW will continue to follow-up in the morning.  Melbourne Abts, LCSWA Disposition staff 03/10/2015 11:24 PM

## 2015-03-10 NOTE — ED Provider Notes (Signed)
Pt stable Pt currently in psych hold Per notes, pt is to have psychiatry eval in the morning   Zadie Rhine, MD 03/10/15 0330

## 2015-03-10 NOTE — ED Notes (Signed)
[facility] requesting EKG -- order placed
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
## 2015-03-11 NOTE — ED Provider Notes (Signed)
Pt here from nursing facility for aggressive behavior She has shown aggression previously and h/o psychotic behavior She is currently awake/alert at this time It has been recommended that she is admitted to hospital BP 128/56 mmHg  Pulse 68  Temp(Src) 98.2 F (36.8 C) (Oral)  Resp 15  Wt 202 lb (91.627 kg)  SpO2 98%   Zadie Rhine, MD 03/11/15 [phone]

## 2015-03-11 NOTE — ED Provider Notes (Signed)
Pt currently waiting placement to [facility] BP 132/80 mmHg  Pulse 63  Temp(Src) 98.3 F (36.8 C) (Oral)  Resp 18  Wt 202 lb (91.627 kg)  SpO2 100%   EKG Interpretation  Date/Time:  Friday March 10 2015 16:47:59 EDT Ventricular Rate:  61 PR Interval:  176 QRS Duration: 110 QT Interval:  394 QTC Calculation: 396 R Axis:   30 Text Interpretation:  Normal sinus rhythm Incomplete right bundle branch block Borderline ECG artifact noted No significant change since last tracing Confirmed by Bebe Shaggy  MD, Najla Aughenbaugh ([id number]) on 03/11/2015 12:03:44 AM        Zadie Rhine, MD 03/11/15 0004

## 2015-03-11 NOTE — ED Notes (Signed)
Paperwork faxed to [facility]

## 2015-03-11 NOTE — ED Notes (Signed)
[facility] Here for transport, pt clothes and wheelchair sent with her. Pt alert and cooperative.

## 2015-03-11 NOTE — ED Notes (Signed)
Pt changed into paper scrubs.

## 2015-03-11 NOTE — ED Provider Notes (Signed)
BP 102/53 mmHg  Pulse 58  Temp(Src) 98.4 F (36.9 C) (Oral)  Resp 14  Wt 202 lb (91.627 kg)  SpO2 96% The patient appears reasonably stabilized for transfer considering the current resources, flow, and capabilities available in the ED at this time, and I doubt any other [facility] requiring further screening and/or treatment in the ED prior to transfer.   Zadie Rhine, MD 03/11/15 [phone]

## 2015-03-11 NOTE — ED Notes (Signed)
Per Lupita Leash at [facility] pt. Has been accepted and can come after 7am to bed 402-B

## 2015-03-12 DIAGNOSIS — E559 Vitamin D deficiency, unspecified: Secondary | ICD-10-CM | POA: Diagnosis not present

## 2015-03-12 DIAGNOSIS — F79 Unspecified intellectual disabilities: Secondary | ICD-10-CM | POA: Diagnosis not present

## 2015-03-12 DIAGNOSIS — I10 Essential (primary) hypertension: Secondary | ICD-10-CM | POA: Diagnosis not present

## 2015-03-12 DIAGNOSIS — R569 Unspecified convulsions: Secondary | ICD-10-CM | POA: Diagnosis not present

## 2015-03-13 DIAGNOSIS — E559 Vitamin D deficiency, unspecified: Secondary | ICD-10-CM | POA: Diagnosis not present

## 2015-03-13 DIAGNOSIS — F99 Mental disorder, not otherwise specified: Secondary | ICD-10-CM | POA: Diagnosis not present

## 2015-03-13 DIAGNOSIS — E785 Hyperlipidemia, unspecified: Secondary | ICD-10-CM | POA: Diagnosis not present

## 2015-03-13 DIAGNOSIS — I10 Essential (primary) hypertension: Secondary | ICD-10-CM | POA: Diagnosis not present

## 2015-03-16 DIAGNOSIS — E559 Vitamin D deficiency, unspecified: Secondary | ICD-10-CM | POA: Diagnosis not present

## 2015-03-16 DIAGNOSIS — I10 Essential (primary) hypertension: Secondary | ICD-10-CM | POA: Diagnosis not present

## 2015-03-16 DIAGNOSIS — F99 Mental disorder, not otherwise specified: Secondary | ICD-10-CM | POA: Diagnosis not present

## 2015-03-16 DIAGNOSIS — E785 Hyperlipidemia, unspecified: Secondary | ICD-10-CM | POA: Diagnosis not present

## 2015-03-21 DIAGNOSIS — K5901 Slow transit constipation: Secondary | ICD-10-CM | POA: Diagnosis not present

## 2015-03-21 DIAGNOSIS — I10 Essential (primary) hypertension: Secondary | ICD-10-CM | POA: Diagnosis not present

## 2015-03-21 DIAGNOSIS — R569 Unspecified convulsions: Secondary | ICD-10-CM | POA: Diagnosis not present

## 2015-03-21 DIAGNOSIS — K5900 Constipation, unspecified: Secondary | ICD-10-CM | POA: Diagnosis not present

## 2015-03-21 DIAGNOSIS — R112 Nausea with vomiting, unspecified: Secondary | ICD-10-CM | POA: Diagnosis not present

## 2015-03-22 DIAGNOSIS — R569 Unspecified convulsions: Secondary | ICD-10-CM | POA: Diagnosis not present

## 2015-03-22 DIAGNOSIS — R112 Nausea with vomiting, unspecified: Secondary | ICD-10-CM | POA: Diagnosis not present

## 2015-03-22 DIAGNOSIS — K5901 Slow transit constipation: Secondary | ICD-10-CM | POA: Diagnosis not present

## 2015-03-22 DIAGNOSIS — I10 Essential (primary) hypertension: Secondary | ICD-10-CM | POA: Diagnosis not present

## 2015-03-24 DIAGNOSIS — R569 Unspecified convulsions: Secondary | ICD-10-CM | POA: Diagnosis not present

## 2015-03-24 DIAGNOSIS — K5901 Slow transit constipation: Secondary | ICD-10-CM | POA: Diagnosis not present

## 2015-03-24 DIAGNOSIS — R112 Nausea with vomiting, unspecified: Secondary | ICD-10-CM | POA: Diagnosis not present

## 2015-03-24 DIAGNOSIS — I10 Essential (primary) hypertension: Secondary | ICD-10-CM | POA: Diagnosis not present

## 2015-03-26 DIAGNOSIS — K5901 Slow transit constipation: Secondary | ICD-10-CM | POA: Diagnosis not present

## 2015-03-26 DIAGNOSIS — I10 Essential (primary) hypertension: Secondary | ICD-10-CM | POA: Diagnosis not present

## 2015-03-26 DIAGNOSIS — R569 Unspecified convulsions: Secondary | ICD-10-CM | POA: Diagnosis not present

## 2015-03-29 DIAGNOSIS — I10 Essential (primary) hypertension: Secondary | ICD-10-CM | POA: Diagnosis not present

## 2015-03-29 DIAGNOSIS — F99 Mental disorder, not otherwise specified: Secondary | ICD-10-CM | POA: Diagnosis not present

## 2015-03-29 DIAGNOSIS — E785 Hyperlipidemia, unspecified: Secondary | ICD-10-CM | POA: Diagnosis not present

## 2015-03-29 DIAGNOSIS — E559 Vitamin D deficiency, unspecified: Secondary | ICD-10-CM | POA: Diagnosis not present

## 2015-03-30 DIAGNOSIS — E559 Vitamin D deficiency, unspecified: Secondary | ICD-10-CM | POA: Diagnosis not present

## 2015-03-30 DIAGNOSIS — F99 Mental disorder, not otherwise specified: Secondary | ICD-10-CM | POA: Diagnosis not present

## 2015-03-30 DIAGNOSIS — E785 Hyperlipidemia, unspecified: Secondary | ICD-10-CM | POA: Diagnosis not present

## 2015-03-30 DIAGNOSIS — I10 Essential (primary) hypertension: Secondary | ICD-10-CM | POA: Diagnosis not present

## 2015-04-01 DIAGNOSIS — F99 Mental disorder, not otherwise specified: Secondary | ICD-10-CM | POA: Diagnosis not present

## 2015-04-04 DIAGNOSIS — E785 Hyperlipidemia, unspecified: Secondary | ICD-10-CM | POA: Diagnosis not present

## 2015-04-04 DIAGNOSIS — F99 Mental disorder, not otherwise specified: Secondary | ICD-10-CM | POA: Diagnosis not present

## 2015-04-04 DIAGNOSIS — I10 Essential (primary) hypertension: Secondary | ICD-10-CM | POA: Diagnosis not present

## 2015-04-04 DIAGNOSIS — E559 Vitamin D deficiency, unspecified: Secondary | ICD-10-CM | POA: Diagnosis not present

## 2015-04-05 DIAGNOSIS — F99 Mental disorder, not otherwise specified: Secondary | ICD-10-CM | POA: Diagnosis not present

## 2015-04-05 DIAGNOSIS — E782 Mixed hyperlipidemia: Secondary | ICD-10-CM | POA: Diagnosis not present

## 2015-04-05 DIAGNOSIS — M545 Low back pain: Secondary | ICD-10-CM | POA: Diagnosis not present

## 2015-04-05 DIAGNOSIS — I10 Essential (primary) hypertension: Secondary | ICD-10-CM | POA: Diagnosis not present

## 2015-04-05 DIAGNOSIS — R600 Localized edema: Secondary | ICD-10-CM | POA: Diagnosis not present

## 2015-04-10 DIAGNOSIS — F29 Unspecified psychosis not due to a substance or known physiological condition: Secondary | ICD-10-CM | POA: Diagnosis not present

## 2015-04-10 DIAGNOSIS — F99 Mental disorder, not otherwise specified: Secondary | ICD-10-CM | POA: Diagnosis not present

## 2015-04-10 DIAGNOSIS — R600 Localized edema: Secondary | ICD-10-CM | POA: Diagnosis not present

## 2015-04-10 DIAGNOSIS — Z111 Encounter for screening for respiratory tuberculosis: Secondary | ICD-10-CM | POA: Diagnosis not present

## 2015-04-10 DIAGNOSIS — I10 Essential (primary) hypertension: Secondary | ICD-10-CM | POA: Diagnosis not present

## 2015-04-10 DIAGNOSIS — M545 Low back pain: Secondary | ICD-10-CM | POA: Diagnosis not present

## 2015-04-10 DIAGNOSIS — E782 Mixed hyperlipidemia: Secondary | ICD-10-CM | POA: Diagnosis not present

## 2015-04-17 DIAGNOSIS — K5901 Slow transit constipation: Secondary | ICD-10-CM | POA: Diagnosis not present

## 2015-04-17 DIAGNOSIS — M6281 Muscle weakness (generalized): Secondary | ICD-10-CM | POA: Diagnosis not present

## 2015-04-17 DIAGNOSIS — I10 Essential (primary) hypertension: Secondary | ICD-10-CM | POA: Diagnosis not present

## 2015-04-17 DIAGNOSIS — G4089 Other seizures: Secondary | ICD-10-CM | POA: Diagnosis not present

## 2015-04-17 DIAGNOSIS — E559 Vitamin D deficiency, unspecified: Secondary | ICD-10-CM | POA: Diagnosis not present

## 2015-04-25 DIAGNOSIS — N3281 Overactive bladder: Secondary | ICD-10-CM | POA: Diagnosis not present

## 2015-04-25 DIAGNOSIS — M6281 Muscle weakness (generalized): Secondary | ICD-10-CM | POA: Diagnosis not present

## 2015-04-25 DIAGNOSIS — I10 Essential (primary) hypertension: Secondary | ICD-10-CM | POA: Diagnosis not present

## 2015-04-25 DIAGNOSIS — F29 Unspecified psychosis not due to a substance or known physiological condition: Secondary | ICD-10-CM | POA: Diagnosis not present

## 2015-04-25 DIAGNOSIS — F0281 Dementia in other diseases classified elsewhere with behavioral disturbance: Secondary | ICD-10-CM | POA: Diagnosis not present

## 2015-04-25 DIAGNOSIS — E559 Vitamin D deficiency, unspecified: Secondary | ICD-10-CM | POA: Diagnosis not present

## 2015-04-25 DIAGNOSIS — G40909 Epilepsy, unspecified, not intractable, without status epilepticus: Secondary | ICD-10-CM | POA: Diagnosis not present

## 2015-04-25 DIAGNOSIS — M549 Dorsalgia, unspecified: Secondary | ICD-10-CM | POA: Diagnosis not present

## 2015-04-25 DIAGNOSIS — G300 Alzheimer's disease with early onset: Secondary | ICD-10-CM | POA: Diagnosis not present

## 2015-04-25 DIAGNOSIS — R32 Unspecified urinary incontinence: Secondary | ICD-10-CM | POA: Diagnosis not present

## 2015-04-25 DIAGNOSIS — K5901 Slow transit constipation: Secondary | ICD-10-CM | POA: Diagnosis not present

## 2015-04-25 DIAGNOSIS — F89 Unspecified disorder of psychological development: Secondary | ICD-10-CM | POA: Diagnosis not present

## 2015-04-25 DIAGNOSIS — E785 Hyperlipidemia, unspecified: Secondary | ICD-10-CM | POA: Diagnosis not present

## 2015-04-27 DIAGNOSIS — F0281 Dementia in other diseases classified elsewhere with behavioral disturbance: Secondary | ICD-10-CM | POA: Diagnosis not present

## 2015-04-27 DIAGNOSIS — F89 Unspecified disorder of psychological development: Secondary | ICD-10-CM | POA: Diagnosis not present

## 2015-04-27 DIAGNOSIS — E559 Vitamin D deficiency, unspecified: Secondary | ICD-10-CM | POA: Diagnosis not present

## 2015-04-27 DIAGNOSIS — G300 Alzheimer's disease with early onset: Secondary | ICD-10-CM | POA: Diagnosis not present

## 2015-04-27 DIAGNOSIS — M6281 Muscle weakness (generalized): Secondary | ICD-10-CM | POA: Diagnosis not present

## 2015-04-27 DIAGNOSIS — K5901 Slow transit constipation: Secondary | ICD-10-CM | POA: Diagnosis not present

## 2015-04-27 DIAGNOSIS — F29 Unspecified psychosis not due to a substance or known physiological condition: Secondary | ICD-10-CM | POA: Diagnosis not present

## 2015-04-27 DIAGNOSIS — G40909 Epilepsy, unspecified, not intractable, without status epilepticus: Secondary | ICD-10-CM | POA: Diagnosis not present

## 2015-04-27 DIAGNOSIS — M549 Dorsalgia, unspecified: Secondary | ICD-10-CM | POA: Diagnosis not present

## 2015-04-27 DIAGNOSIS — N3281 Overactive bladder: Secondary | ICD-10-CM | POA: Diagnosis not present

## 2015-04-27 DIAGNOSIS — R32 Unspecified urinary incontinence: Secondary | ICD-10-CM | POA: Diagnosis not present

## 2015-04-27 DIAGNOSIS — E785 Hyperlipidemia, unspecified: Secondary | ICD-10-CM | POA: Diagnosis not present

## 2015-04-27 DIAGNOSIS — I10 Essential (primary) hypertension: Secondary | ICD-10-CM | POA: Diagnosis not present

## 2015-05-01 DIAGNOSIS — E785 Hyperlipidemia, unspecified: Secondary | ICD-10-CM | POA: Diagnosis not present

## 2015-05-01 DIAGNOSIS — N3281 Overactive bladder: Secondary | ICD-10-CM | POA: Diagnosis not present

## 2015-05-01 DIAGNOSIS — E559 Vitamin D deficiency, unspecified: Secondary | ICD-10-CM | POA: Diagnosis not present

## 2015-05-01 DIAGNOSIS — F89 Unspecified disorder of psychological development: Secondary | ICD-10-CM | POA: Diagnosis not present

## 2015-05-01 DIAGNOSIS — I10 Essential (primary) hypertension: Secondary | ICD-10-CM | POA: Diagnosis not present

## 2015-05-01 DIAGNOSIS — G40909 Epilepsy, unspecified, not intractable, without status epilepticus: Secondary | ICD-10-CM | POA: Diagnosis not present

## 2015-05-01 DIAGNOSIS — F0281 Dementia in other diseases classified elsewhere with behavioral disturbance: Secondary | ICD-10-CM | POA: Diagnosis not present

## 2015-05-01 DIAGNOSIS — F29 Unspecified psychosis not due to a substance or known physiological condition: Secondary | ICD-10-CM | POA: Diagnosis not present

## 2015-05-01 DIAGNOSIS — R32 Unspecified urinary incontinence: Secondary | ICD-10-CM | POA: Diagnosis not present

## 2015-05-01 DIAGNOSIS — K5901 Slow transit constipation: Secondary | ICD-10-CM | POA: Diagnosis not present

## 2015-05-01 DIAGNOSIS — M549 Dorsalgia, unspecified: Secondary | ICD-10-CM | POA: Diagnosis not present

## 2015-05-01 DIAGNOSIS — M6281 Muscle weakness (generalized): Secondary | ICD-10-CM | POA: Diagnosis not present

## 2015-05-01 DIAGNOSIS — G300 Alzheimer's disease with early onset: Secondary | ICD-10-CM | POA: Diagnosis not present

## 2015-05-03 DIAGNOSIS — G300 Alzheimer's disease with early onset: Secondary | ICD-10-CM | POA: Diagnosis not present

## 2015-05-03 DIAGNOSIS — R32 Unspecified urinary incontinence: Secondary | ICD-10-CM | POA: Diagnosis not present

## 2015-05-03 DIAGNOSIS — G40909 Epilepsy, unspecified, not intractable, without status epilepticus: Secondary | ICD-10-CM | POA: Diagnosis not present

## 2015-05-03 DIAGNOSIS — F29 Unspecified psychosis not due to a substance or known physiological condition: Secondary | ICD-10-CM | POA: Diagnosis not present

## 2015-05-03 DIAGNOSIS — F89 Unspecified disorder of psychological development: Secondary | ICD-10-CM | POA: Diagnosis not present

## 2015-05-03 DIAGNOSIS — I10 Essential (primary) hypertension: Secondary | ICD-10-CM | POA: Diagnosis not present

## 2015-05-03 DIAGNOSIS — K5901 Slow transit constipation: Secondary | ICD-10-CM | POA: Diagnosis not present

## 2015-05-03 DIAGNOSIS — E785 Hyperlipidemia, unspecified: Secondary | ICD-10-CM | POA: Diagnosis not present

## 2015-05-03 DIAGNOSIS — M549 Dorsalgia, unspecified: Secondary | ICD-10-CM | POA: Diagnosis not present

## 2015-05-03 DIAGNOSIS — N3281 Overactive bladder: Secondary | ICD-10-CM | POA: Diagnosis not present

## 2015-05-03 DIAGNOSIS — E559 Vitamin D deficiency, unspecified: Secondary | ICD-10-CM | POA: Diagnosis not present

## 2015-05-03 DIAGNOSIS — F0281 Dementia in other diseases classified elsewhere with behavioral disturbance: Secondary | ICD-10-CM | POA: Diagnosis not present

## 2015-05-03 DIAGNOSIS — M6281 Muscle weakness (generalized): Secondary | ICD-10-CM | POA: Diagnosis not present

## 2015-05-04 ENCOUNTER — Encounter: Payer: Self-pay | Admitting: Neurology

## 2015-05-04 ENCOUNTER — Ambulatory Visit (INDEPENDENT_AMBULATORY_CARE_PROVIDER_SITE_OTHER): Payer: Medicare Other | Admitting: Neurology

## 2015-05-04 VITALS — BP 126/75 | HR 80

## 2015-05-04 DIAGNOSIS — R569 Unspecified convulsions: Secondary | ICD-10-CM | POA: Diagnosis not present

## 2015-05-04 DIAGNOSIS — G219 Secondary parkinsonism, unspecified: Secondary | ICD-10-CM | POA: Diagnosis not present

## 2015-05-04 HISTORY — DX: Secondary parkinsonism, unspecified: G21.9

## 2015-05-04 NOTE — Progress Notes (Signed)
Reason for visit: Seizures  Referring physician: Dr. [name] is a 59 y.o. female  History of present illness:  Christine Pena is a 59 year old white female with a history of mental retardation, and some history of aggressive behavior. The patient is on multiple psychoactive medications including thiothixene and Seroquel, Cogentin. The patient has a history of seizures, she has had 3 recent emergency room visits on May 25, 13th of June, and 16th of June for episodes of altered mental status or seizures. The seizures are described as a period of unresponsiveness with eyes rolling back. She is on phenobarbital taking the 64.8 milligrams tablets, 3 tablets on Monday, Wednesday, and Friday, 2 tablets all other nights of the week. The patient is also on Keppra taking 1000 mg twice daily. The patient has been seen previously by Dr. Judithann Sheen, and MRI brain evaluation has been done in March 2016. This did not show a definite source of seizures. The patient has had EEG evaluations on 2 occasions, once on 10/10/2014 which was normal. 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 The patient has had a recent phenobarbital level that was done on 02/15/2015 with a level of 20.1. The patient has moved to her current extended care facility, [facility], on 04/10/2015. The caretaker that is with her knows little about her prior medical history. She indicates that the patient has not had any seizures since her transfer to this facility. The patient initially was able to stand and help with transfers, but at this point she is completely immobile requiring 3 or 4 people to try to stand her up for transfers. The patient is incontinent of bowel and bladder. She is able to feed herself, otherwise she is a total care patient. She is minimally verbal. She is sent to this office for an evaluation.  Past Medical History  Diagnosis Date  . Hypertension   . Seizures   . Mental retardation   . Hyperlipidemia     . Anxiety   . Vitamin D deficiency   . Obesity   . Dementia   . 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 No pertinent past surgical history.  Family History  Problem Relation Age of Onset  . Heart disease Mother     Social history:  reports that she has never smoked. She has never used smokeless tobacco. She reports that she does not drink alcohol or use illicit drugs.  Medications:  Prior to Admission medications   Medication Sig Start Date End Date Taking? Authorizing Provider  acetaminophen (TYLENOL) 500 MG tablet Take 500 mg by mouth every 6 (six) hours as needed.   Yes Historical Provider, MD  alum & mag hydroxide-simeth (MAALOX/MYLANTA) 200-200-20 MG/5ML suspension Take 30 mLs by mouth every 6 (six) hours as needed for indigestion or heartburn.   Yes Historical Provider, MD  benztropine (COGENTIN) 1 MG tablet Take 1 mg by mouth 2 (two) times daily.   Yes Historical Provider, MD  chlorhexidine (PERIDEX) 0.12 % solution Use as directed 10 mLs in the mouth or throat 2 (two) times daily.    Yes Historical Provider, MD  clonazePAM (KLONOPIN) 1 MG tablet Take 1 mg by mouth 2 (two) times daily.   Yes Historical Provider, MD  furosemide (LASIX) 20 MG tablet Take 20 mg by mouth 2 (two) times daily.   Yes Historical Provider, MD  guaifenesin (ROBITUSSIN) 100 MG/5ML syrup Take 200 mg by mouth 3 (three) times daily as needed for  cough.   Yes Historical Provider, MD  levETIRAcetam (KEPPRA) 1000 MG tablet Take 1 tablet (1,000 mg total) by mouth 2 (two) times daily. 01/30/15  Yes Erick Blinks, MD  loperamide (IMODIUM) 2 MG capsule Take 2 mg by mouth as needed for diarrhea or loose stools.   Yes Historical Provider, MD  magnesium hydroxide (MILK OF MAGNESIA) 400 MG/5ML suspension Take 30 mLs by mouth daily as needed for mild constipation.   Yes Historical Provider, MD  miconazole (MICOTIN) 2 % cream Apply 1 application topically 2 (two) times daily.   Yes Historical Provider,  MD  moexipril (UNIVASC) 7.5 MG tablet Take 7.5 mg by mouth daily.  12/30/13  Yes Historical Provider, MD  oxybutynin (DITROPAN-XL) 5 MG 24 hr tablet Take 5 mg by mouth daily.   Yes Historical Provider, MD  PHENobarbital (LUMINAL) 64.8 MG tablet Take 2 tablets (129.6 mg total) by mouth at bedtime. Patient taking differently: Take 64.8 mg by mouth at bedtime. Patient takes 2 tablets Tuesday, Thursday, Saturday and Sunday. She takes 3 tablets Monday, Wednesday, Friday. 10/11/14  Yes Lesle Chris Black, NP  pravastatin (PRAVACHOL) 40 MG tablet Take 40 mg by mouth daily.   Yes Historical Provider, MD  QUEtiapine (SEROQUEL) 50 MG tablet Take 1 tablet (50 mg total) by mouth at bedtime. Patient taking differently: Take 100 mg by mouth 2 (two) times daily.  10/11/14  Yes Lesle Chris Black, NP  simvastatin (ZOCOR) 20 MG tablet Take 20 mg by mouth at bedtime.  12/30/13  Yes Historical Provider, MD  thiothixene (NAVANE) 10 MG capsule Take 10 mg by mouth 3 (three) times daily.   Yes Historical Provider, MD  Vitamin D, Ergocalciferol, (DRISDOL) 50000 UNITS CAPS capsule Take 50,000 Units by mouth every 7 (seven) days. Takes on Saturdays.   Yes Historical Provider, MD  vitamin E (VITAMIN E) 200 UNIT capsule Take 200 Units by mouth daily.   Yes Historical Provider, MD      Allergies  Allergen Reactions  . Depakote [Valproic Acid]     altered mental status/ high ammonia  . Haldol [Haloperidol Lactate]     Unknown reaction.    ROS:  Out of a complete 14 system review of symptoms, the patient complains only of the following symptoms, and all other reviewed systems are negative.  Swelling in the legs Snoring Memory loss, mental retardation Seizures  Blood pressure 126/75, pulse 80.  Physical Exam  General: The patient is alert and cooperative at the time of the examination. The patient is markedly obese.  Eyes: Pupils are equal, round, and reactive to light. Discs are flat bilaterally.  Neck: The neck is supple,  no carotid bruits are noted.  Respiratory: The respiratory examination is clear.  Cardiovascular: The cardiovascular examination reveals a regular rate and rhythm, no obvious murmurs or rubs are noted.  Skin: Extremities are with 3+ edema below the knees is noted bilaterally.  Neurologic Exam  Mental status: The patient is alert, is minimally verbal, will follow some commands.  Cranial nerves: Facial symmetry is present. Masking of the face is seen. The patient responded little to pain stimulation. The strength of the facial muscles and the muscles to head turning and shoulder shrug are normal bilaterally. Speech is limited, to be slightly dysarthric. The patient will blink to threat bilaterally. Pupils are round reacted to light.  Motor: The motor testing reveals symmetric motor tone on all fours, the patient appears to have better use of the arms than the legs, she will  not cooperate for direct motor testing, but no obvious weakness of the extremities was seen.  Sensory: Sensory testing is quite difficult, the patient does not respond to painful stimulation on all fours.  Coordination: Cerebellar testing reveals fairly good finger-nose-finger bilaterally. The patient does not perform heel-to-shin on either side, tremors are noted in both lower extremities.  Gait and station: Gait is difficult to test, the patient could not stand up, even with maximal assistance.  Reflexes: Deep tendon reflexes are symmetric and normal bilaterally. Toes are downgoing bilaterally.   MRI brain 11/24/14:  IMPRESSION: 1. Moderate cerebellar atrophy. This is nonspecific, but can be seen in the setting of chronic anti epileptic therapy. 2. No acute or focal abnormality to explain seizures or altered mental status.  * MRI scan images were reviewed online. I agree with the written report.    Assessment/Plan:  1. Mental retardation  2. Gait disorder  3. History of seizures  4. 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 The patient may be developing secondary parkinsonism. A reduction in the thiothixene may be indicated at this time. The patient has not had any seizures since her transfer to the current extended care facility, I will not alter seizure medications at this time. The patient has already had an EEG and MRI brain evaluation. Blood levels of phenobarbital have been done recently. She will follow-up in 4 months, the facility is to contact our office if any further seizure-type events are noted.  Marlan Palau MD 05/04/2015 8:07 PM  [facility] [address] [place], [place] [address]  Phone [phone] Fax [phone]

## 2015-05-04 NOTE — Patient Instructions (Addendum)
Continue the phenobarbital and Keppra at the current dosing regimen. 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 I would recommend a consideration for tapering the dose of this medication to help improve mobility.  Epilepsy Epilepsy is a disorder in which a person has repeated seizures over time. A seizure is a release of abnormal electrical activity in the brain. Seizures can cause a change in attention, behavior, or the ability to remain awake and alert (altered mental status). Seizures often involve uncontrollable shaking (convulsions).  Most people with epilepsy lead normal lives. However, people with epilepsy are at an increased risk of falls, accidents, and injuries. Therefore, it is important to begin treatment right away. CAUSES  Epilepsy has many possible causes. Anything that disturbs the normal pattern of brain cell activity can lead to seizures. This may include:   Head injury.  Birth trauma.  High fever as a child.  Stroke.  Bleeding into or around the brain.  Certain drugs.  Prolonged low oxygen, such as what occurs after CPR efforts.  Abnormal brain development.  Certain illnesses, such as meningitis, encephalitis (brain infection), malaria, and other infections.  An imbalance of nerve signaling chemicals (neurotransmitters).  SIGNS AND SYMPTOMS  The symptoms of a seizure can vary greatly from one person to another. Right before a seizure, you may have a warning (aura) that a seizure is about to occur. An aura may include the following symptoms:  Fear or anxiety.  Nausea.  Feeling like the room is spinning (vertigo).  Vision changes, such as seeing flashing lights or spots. Common symptoms during a seizure include:  Abnormal sensations, such as an abnormal smell or a bitter taste in the mouth.   Sudden, general body stiffness.   Convulsions that involve rhythmic jerking  of the face, arm, or leg on one or both sides.   Sudden change in consciousness.   Appearing to be awake but not responding.   Appearing to be asleep but cannot be awakened.   Grimacing, chewing, lip smacking, drooling, tongue biting, or loss of bowel or bladder control. After a seizure, you may feel sleepy for a while. DIAGNOSIS  Your health care provider will ask about your symptoms and take a medical history. Descriptions from any witnesses to your seizures will be very helpful in the diagnosis. A physical exam, including a detailed neurological exam, is necessary. Various tests may be done, such as:   An electroencephalogram (EEG). This is a painless test of your brain waves. In this test, a diagram is created of your brain waves. These diagrams can be interpreted by a specialist.  An MRI of the brain.   A CT scan of the brain.   A spinal tap (lumbar puncture, LP).  Blood tests to check for signs of infection or abnormal blood chemistry. TREATMENT  There is no cure for epilepsy, but it is generally treatable. Once epilepsy is diagnosed, it is important to begin treatment as soon as possible. For most people with epilepsy, seizures can be controlled with medicines. The following may also be used:  A pacemaker for the brain (vagus nerve stimulator) can be used for people with seizures that are not well controlled by medicine.  Surgery on the brain. For some people, epilepsy eventually goes away. HOME CARE INSTRUCTIONS   Follow your health care provider's recommendations on driving and safety in normal activities.  Get enough rest. Lack of sleep can cause seizures.  Only take  over-the-counter or prescription medicines as directed by your health care provider. Take any prescribed medicine exactly as directed.  Avoid any known triggers of your seizures.  Keep a seizure diary. Record what you recall about any seizure, especially any possible trigger.   Make sure the  people you live and work with know that you are prone to seizures. They should receive instructions on how to help you. In general, a witness to a seizure should:   Cushion your head and body.   Turn you on your side.   Avoid unnecessarily restraining you.   Not place anything inside your mouth.   Call for emergency medical help if there is any question about what has occurred.   Follow up with your health care provider as directed. You may need regular blood tests to monitor the levels of your medicine.  SEEK MEDICAL CARE IF:   You develop signs of infection or other illness. This might increase the risk of a seizure.   You seem to be having more frequent seizures.   Your seizure pattern is changing.  SEEK IMMEDIATE MEDICAL CARE IF:   You have a seizure that does not stop after a few moments.   You have a seizure that causes any difficulty in breathing.   You have a seizure that results in a very severe headache.   You have a seizure that leaves you with the inability to speak or use a part of your body.  Document Released: 09/09/2005 Document Revised: 06/30/2013 Document Reviewed: 04/21/2013 [facility] Patient Information 2015 [place], [place]. This information is not intended to replace advice given to you by your health care provider. Make sure you discuss any questions you have with your health care provider.

## 2015-05-08 DIAGNOSIS — R32 Unspecified urinary incontinence: Secondary | ICD-10-CM | POA: Diagnosis not present

## 2015-05-08 DIAGNOSIS — F29 Unspecified psychosis not due to a substance or known physiological condition: Secondary | ICD-10-CM | POA: Diagnosis not present

## 2015-05-08 DIAGNOSIS — K5901 Slow transit constipation: Secondary | ICD-10-CM | POA: Diagnosis not present

## 2015-05-08 DIAGNOSIS — M6281 Muscle weakness (generalized): Secondary | ICD-10-CM | POA: Diagnosis not present

## 2015-05-08 DIAGNOSIS — I10 Essential (primary) hypertension: Secondary | ICD-10-CM | POA: Diagnosis not present

## 2015-05-08 DIAGNOSIS — N3281 Overactive bladder: Secondary | ICD-10-CM | POA: Diagnosis not present

## 2015-05-08 DIAGNOSIS — F0281 Dementia in other diseases classified elsewhere with behavioral disturbance: Secondary | ICD-10-CM | POA: Diagnosis not present

## 2015-05-08 DIAGNOSIS — G40909 Epilepsy, unspecified, not intractable, without status epilepticus: Secondary | ICD-10-CM | POA: Diagnosis not present

## 2015-05-08 DIAGNOSIS — M549 Dorsalgia, unspecified: Secondary | ICD-10-CM | POA: Diagnosis not present

## 2015-05-08 DIAGNOSIS — E785 Hyperlipidemia, unspecified: Secondary | ICD-10-CM | POA: Diagnosis not present

## 2015-05-08 DIAGNOSIS — F89 Unspecified disorder of psychological development: Secondary | ICD-10-CM | POA: Diagnosis not present

## 2015-05-08 DIAGNOSIS — G300 Alzheimer's disease with early onset: Secondary | ICD-10-CM | POA: Diagnosis not present

## 2015-05-08 DIAGNOSIS — E559 Vitamin D deficiency, unspecified: Secondary | ICD-10-CM | POA: Diagnosis not present

## 2015-05-10 DIAGNOSIS — M6281 Muscle weakness (generalized): Secondary | ICD-10-CM | POA: Diagnosis not present

## 2015-05-10 DIAGNOSIS — M549 Dorsalgia, unspecified: Secondary | ICD-10-CM | POA: Diagnosis not present

## 2015-05-10 DIAGNOSIS — F89 Unspecified disorder of psychological development: Secondary | ICD-10-CM | POA: Diagnosis not present

## 2015-05-10 DIAGNOSIS — K5901 Slow transit constipation: Secondary | ICD-10-CM | POA: Diagnosis not present

## 2015-05-10 DIAGNOSIS — R32 Unspecified urinary incontinence: Secondary | ICD-10-CM | POA: Diagnosis not present

## 2015-05-10 DIAGNOSIS — F29 Unspecified psychosis not due to a substance or known physiological condition: Secondary | ICD-10-CM | POA: Diagnosis not present

## 2015-05-10 DIAGNOSIS — G40909 Epilepsy, unspecified, not intractable, without status epilepticus: Secondary | ICD-10-CM | POA: Diagnosis not present

## 2015-05-10 DIAGNOSIS — I10 Essential (primary) hypertension: Secondary | ICD-10-CM | POA: Diagnosis not present

## 2015-05-10 DIAGNOSIS — N3281 Overactive bladder: Secondary | ICD-10-CM | POA: Diagnosis not present

## 2015-05-10 DIAGNOSIS — G300 Alzheimer's disease with early onset: Secondary | ICD-10-CM | POA: Diagnosis not present

## 2015-05-10 DIAGNOSIS — F0281 Dementia in other diseases classified elsewhere with behavioral disturbance: Secondary | ICD-10-CM | POA: Diagnosis not present

## 2015-05-10 DIAGNOSIS — E559 Vitamin D deficiency, unspecified: Secondary | ICD-10-CM | POA: Diagnosis not present

## 2015-05-10 DIAGNOSIS — E785 Hyperlipidemia, unspecified: Secondary | ICD-10-CM | POA: Diagnosis not present

## 2015-05-14 ENCOUNTER — Emergency Department (HOSPITAL_COMMUNITY): Payer: Medicare Other

## 2015-05-14 ENCOUNTER — Inpatient Hospital Stay (HOSPITAL_COMMUNITY)
Admission: EM | Admit: 2015-05-14 | Discharge: 2015-05-17 | DRG: 101 | Disposition: A | Discharge status: Home / Self Care | Payer: Medicare Other | Attending: Internal Medicine | Admitting: Internal Medicine

## 2015-05-14 ENCOUNTER — Encounter (HOSPITAL_COMMUNITY): Payer: Self-pay | Admitting: *Deleted

## 2015-05-14 DIAGNOSIS — S0990XA Unspecified injury of head, initial encounter: Secondary | ICD-10-CM | POA: Diagnosis not present

## 2015-05-14 DIAGNOSIS — I10 Essential (primary) hypertension: Secondary | ICD-10-CM | POA: Diagnosis not present

## 2015-05-14 DIAGNOSIS — F419 Anxiety disorder, unspecified: Secondary | ICD-10-CM | POA: Diagnosis not present

## 2015-05-14 DIAGNOSIS — F79 Unspecified intellectual disabilities: Secondary | ICD-10-CM | POA: Diagnosis not present

## 2015-05-14 DIAGNOSIS — E559 Vitamin D deficiency, unspecified: Secondary | ICD-10-CM | POA: Diagnosis present

## 2015-05-14 DIAGNOSIS — S199XXA Unspecified injury of neck, initial encounter: Secondary | ICD-10-CM | POA: Diagnosis not present

## 2015-05-14 DIAGNOSIS — E785 Hyperlipidemia, unspecified: Secondary | ICD-10-CM | POA: Diagnosis present

## 2015-05-14 DIAGNOSIS — G40909 Epilepsy, unspecified, not intractable, without status epilepticus: Principal | ICD-10-CM | POA: Diagnosis present

## 2015-05-14 DIAGNOSIS — G219 Secondary parkinsonism, unspecified: Secondary | ICD-10-CM | POA: Diagnosis not present

## 2015-05-14 DIAGNOSIS — Z888 Allergy status to other drugs, medicaments and biological substances status: Secondary | ICD-10-CM

## 2015-05-14 DIAGNOSIS — W19XXXA Unspecified fall, initial encounter: Secondary | ICD-10-CM | POA: Diagnosis present

## 2015-05-14 DIAGNOSIS — Z6833 Body mass index (BMI) 33.0-33.9, adult: Secondary | ICD-10-CM

## 2015-05-14 DIAGNOSIS — Z79899 Other long term (current) drug therapy: Secondary | ICD-10-CM

## 2015-05-14 DIAGNOSIS — J9811 Atelectasis: Secondary | ICD-10-CM | POA: Diagnosis not present

## 2015-05-14 DIAGNOSIS — E669 Obesity, unspecified: Secondary | ICD-10-CM | POA: Diagnosis present

## 2015-05-14 DIAGNOSIS — R569 Unspecified convulsions: Secondary | ICD-10-CM

## 2015-05-14 DIAGNOSIS — Y92099 Unspecified place in other non-institutional residence as the place of occurrence of the external cause: Secondary | ICD-10-CM

## 2015-05-14 DIAGNOSIS — W050XXA Fall from non-moving wheelchair, initial encounter: Secondary | ICD-10-CM | POA: Diagnosis present

## 2015-05-14 LAB — CBC WITH DIFFERENTIAL/PLATELET
BASOS PCT: 0 % (ref 0–1)
Basophils Absolute: 0 10*3/uL (ref 0.0–0.1)
EOS ABS: 0 10*3/uL (ref 0.0–0.7)
EOS PCT: 0 % (ref 0–5)
HCT: 37.2 % (ref 36.0–46.0)
Hemoglobin: 12.9 g/dL (ref 12.0–15.0)
LYMPHS ABS: 1.3 10*3/uL (ref 0.7–4.0)
Lymphocytes Relative: 16 % (ref 12–46)
MCH: 30.1 pg (ref 26.0–34.0)
MCHC: 34.7 g/dL (ref 30.0–36.0)
MCV: 86.9 fL (ref 78.0–100.0)
Monocytes Absolute: 0.6 10*3/uL (ref 0.1–1.0)
Monocytes Relative: 7 % (ref 3–12)
Neutro Abs: 6.2 10*3/uL (ref 1.7–7.7)
Neutrophils Relative %: 77 % (ref 43–77)
PLATELETS: 285 10*3/uL (ref 150–400)
RBC: 4.28 MIL/uL (ref 3.87–5.11)
RDW: 12.3 % (ref 11.5–15.5)
WBC: 8.1 10*3/uL (ref 4.0–10.5)

## 2015-05-14 LAB — I-STAT CHEM 8, ED
BUN: 11 mg/dL (ref 6–20)
CALCIUM ION: 1.24 mmol/L — AB (ref 1.12–1.23)
CHLORIDE: 104 mmol/L (ref 101–111)
Creatinine, Ser: 0.6 mg/dL (ref 0.44–1.00)
Glucose, Bld: 116 mg/dL — ABNORMAL HIGH (ref 65–99)
HEMATOCRIT: 38 % (ref 36.0–46.0)
Hemoglobin: 12.9 g/dL (ref 12.0–15.0)
Potassium: 3.5 mmol/L (ref 3.5–5.1)
SODIUM: 144 mmol/L (ref 135–145)
TCO2: 28 mmol/L (ref 0–100)

## 2015-05-14 LAB — CBG MONITORING, ED: GLUCOSE-CAPILLARY: 134 mg/dL — AB (ref 65–99)

## 2015-05-14 MED ORDER — LEVETIRACETAM 1000 MG PO TABS: 1000.0000 mg | ORAL_TABLET | ORAL | Status: DC

## 2015-05-14 NOTE — ED Notes (Signed)
Urinalysis d/c per MD

## 2015-05-14 NOTE — ED Notes (Signed)
[facility] called for transport.  

## 2015-05-14 NOTE — ED Notes (Signed)
Seizure pads placed on the bed 

## 2015-05-14 NOTE — ED Notes (Addendum)
Pt to ED from [facility] after falling face forward from her wheelchair. Per nursing facility, pt had full body shaking episode lasting 6-7 minutes long. Hx of dementia. CBG 172; 142 palpated bp

## 2015-05-14 NOTE — ED Notes (Signed)
Dr Erenest Rasher at bedside.

## 2015-05-14 NOTE — ED Notes (Addendum)
Multiple attempts at calling report to facility; no answer at facility

## 2015-05-14 NOTE — Discharge Instructions (Signed)

## 2015-05-14 NOTE — ED Provider Notes (Signed)
The patient is a 59 year old female, she has medical problems including a baseline mental status which is altered. She presents after reportedly having a fall out of a wheelchair forward striking her head and then having a post-head injury seizure which lasted approximately 6 or 7 minutes. The patient is unable to give any history, level V caveat applies. On exam the patient does not follow commands with her arms or legs however she is able to say "will perform imaging of the head and cervical spine, basic labs, evaluate for source of the seizure though it was likely posttraumatic. [facility] when I ask her to open her mouth. She follows my finger with her eyes occasionally, she has a large hematoma to the top of her head.  evaluated with labs and CT scan, source of seizure was likely posttraumatic.   I saw and evaluated the patient, reviewed the resident's note and I agree with the findings and plan.   I personally interpreted the EKG as well as the resident and agree with the interpretation on the resident's chart.  Final diagnoses:  Fall, initial encounter  Seizure      Eber Hong, MD 05/14/15 2334

## 2015-05-14 NOTE — ED Notes (Signed)
Patient returned from CT

## 2015-05-14 NOTE — ED Provider Notes (Signed)
History   Chief Complaint  Patient presents with  . Fall    HPI 59 year old female with past medical history as below notable for mental retardation, nursing home patient, dementia, Parkinson's, seizures who presents to ED after falling face forward from wheelchair and subsequently having a 6 minute generalized seizure per nursing home staff. This was witnessed. Prior to this fall patient was reported to be her baseline which is nonconversant and wheelchair-bound. Staff noted a bruise to her head and brow. Patient has not had any reported fevers. History is limited second patient mental retardation and condition. EMS reports patient was stable during transport.  Past medical/surgical history, social history, medications, allergies and FH have been reviewed with patient and/or in documentation. Furthermore, if pt family or friend(s) present, additional historical information was obtained from them.  Past Medical History  Diagnosis Date  . Hypertension   . Seizures   . Mental retardation   . Hyperlipidemia   . Anxiety   . Vitamin D deficiency   . Obesity   . Dementia   . 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 No pertinent past surgical history. Family History  Problem Relation Age of Onset  . Heart disease Mother    Social History  Substance Use Topics  . Smoking status: Never Smoker   . Smokeless tobacco: Never Used  . Alcohol Use: No     Review of Systems Unable to obtain severe to patient condition  Physical Exam  Physical Exam  ED Triage Vitals  Enc Vitals Group     BP 05/14/15 2144 142/91 mmHg     Pulse Rate 05/14/15 2144 99     Resp 05/14/15 2144 12     Temp 05/14/15 2204 98.2 F (36.8 C)     Temp Source 05/14/15 2204 Oral     SpO2 05/14/15 2144 94 %     Weight --      Height --      Head Cir --      Peak Flow --      Pain Score --      Pain Loc --      Pain Edu? --      Excl. in GC? --    Constitutional: Chronically ill-appearing  59 year old female who is nonconversant. Patient is able to follow my finger with her eyes and open her mouth but otherwise is not following commands. Head: Hematoma to superior head and small contusion to left brow. No signs of depressed skull fracture. Eyes: Extraocular motion intact, no scleral icterus Mouth: MMM, OP clear Neck: Supple without meningismus, mass, or overt JVD. No midline tenderness to palpation. Respiratory: No respiratory distress. Normal WOB. No w/r/g. CV: RRR, no obvious murmurs.  Pulses +2 and symmetric. Euvolemic Abdomen: Soft, NT, ND, no r/g. No mass.  MSK: Extremities are atraumatic without deformity, ROM intact Skin: Warm, dry, intact without rash Neuro: MAE 4/5 x 4. increased tone diffusely. Patient has tremors in bilateral upper extremities.    ED Course  Procedures   Labs Reviewed  CBG MONITORING, ED - Abnormal; Notable for the following:    Glucose-Capillary 134 (*)    All other components within normal limits  I-STAT CHEM 8, ED - Abnormal; Notable for the following:    Glucose, Bld 116 (*)    Calcium, Ion 1.24 (*)    All other components within normal limits  CBC WITH DIFFERENTIAL/PLATELET   I personally reviewed and interpreted all labs.  Ct Head Wo Contrast  05/14/2015   CLINICAL DATA:  Trauma, injury. Fall forward from wheelchair landing on face. Evaluate for intracranial bleed or cervical spine injury.  EXAM: CT HEAD WITHOUT CONTRAST  CT CERVICAL SPINE WITHOUT CONTRAST  TECHNIQUE: Multidetector CT imaging of the head and cervical spine was performed following the standard protocol without intravenous contrast. Multiplanar CT image reconstructions of the cervical spine were also generated.  COMPARISON:  Most recent head and cervical spine CT 01/28/2015  FINDINGS: CT HEAD FINDINGS  No intracranial hemorrhage, mass effect, or midline shift. No hydrocephalus. The basilar cisterns are patent. No evidence of territorial infarct. No intracranial fluid  collection. Cerebellar atrophy is unchanged. Questionable right frontal scalp swelling. Calvarium is intact. Included paranasal sinuses and mastoid air cells are well aerated.  CT CERVICAL SPINE FINDINGS  Cervical spine alignment is maintained. Vertebral body heights preserved. There is no fracture. The dens is intact. There are no jumped or perched facets. Mild diffuse disc space narrowing and endplate spurring most prominent in the lower cervical spine. Prominent Schmorl's node involving inferior endplate of T2. No prevertebral soft tissue edema.  IMPRESSION: 1. No acute intracranial abnormality. Cerebellar atrophy is unchanged. 2. No acute fracture or subluxation in the cervical spine.   Electronically Signed   By: Rubye Oaks M.D.   On: 05/14/2015 23:13   Ct Cervical Spine Wo Contrast  05/14/2015   CLINICAL DATA:  Trauma, injury. Fall forward from wheelchair landing on face. Evaluate for intracranial bleed or cervical spine injury.  EXAM: CT HEAD WITHOUT CONTRAST  CT CERVICAL SPINE WITHOUT CONTRAST  TECHNIQUE: Multidetector CT imaging of the head and cervical spine was performed following the standard protocol without intravenous contrast. Multiplanar CT image reconstructions of the cervical spine were also generated.  COMPARISON:  Most recent head and cervical spine CT 01/28/2015  FINDINGS: CT HEAD FINDINGS  No intracranial hemorrhage, mass effect, or midline shift. No hydrocephalus. The basilar cisterns are patent. No evidence of territorial infarct. No intracranial fluid collection. Cerebellar atrophy is unchanged. Questionable right frontal scalp swelling. Calvarium is intact. Included paranasal sinuses and mastoid air cells are well aerated.  CT CERVICAL SPINE FINDINGS  Cervical spine alignment is maintained. Vertebral body heights preserved. There is no fracture. The dens is intact. There are no jumped or perched facets. Mild diffuse disc space narrowing and endplate spurring most prominent in the  lower cervical spine. Prominent Schmorl's node involving inferior endplate of T2. No prevertebral soft tissue edema.  IMPRESSION: 1. No acute intracranial abnormality. Cerebellar atrophy is unchanged. 2. No acute fracture or subluxation in the cervical spine.   Electronically Signed   By: Rubye Oaks M.D.   On: 05/14/2015 23:13   Dg Chest Portable 1 View  05/14/2015   CLINICAL DATA:  Fall with seizure.  Evaluate bleed.  EXAM: PORTABLE CHEST - 1 VIEW  COMPARISON:  Chest radiograph 02/15/2015  FINDINGS: Lung volumes remain low. Patient is rotated. Heart at the upper limits of normal in size. Mild vascular congestion without overt edema. Mild bibasilar atelectasis, left greater than right. No large pleural effusion. No evident pneumothorax. No acute osseous abnormalities are seen.  IMPRESSION: 1. No evidence of acute traumatic injury allowing for low lung volumes and rotation. 2. Mild vascular congestion and bibasilar atelectasis.   Electronically Signed   By: Rubye Oaks M.D.   On: 05/14/2015 22:41   I personally viewed above image(s) which were used in my medical decision making. Formal interpretations by Radiology.   EKG Interpretation  Date/Time:  Ventricular Rate:    PR Interval:    QRS Duration:   QT Interval:    QTC Calculation:   R Axis:     Text Interpretation:         MDM: AUNNA SNOOKS is a 59 y.o. female with H&P as above who p/w CC: Fall, seizure  On arrival, patient is human dynamically stable and in no apparent distress. Patient is MR, demented, wheelchair bound at baseline. Patient does have trauma to her head and had a reported 6 minute seizure following this. Patient also has history of seizures and baseline tremor. Will get CT head, CT C-spine, screening labs for further evaluation. EKG shows normal sinus rhythm, incomplete right bundle branch block, no ST-T wave abnormality or signs of acute ischemia. Unchanged from prior EKG. Workup today is negative for any  acute traumatic or other signs of metabolic or infectious etiology. Patient likely had seizure and then fell over. No seizure activity during encounter today. Patient remained hemodynamically stable. Patient is stable for discharge and I have increased her Keppra dose to 1500 mg in the morning and thousand milligrams in the evening.  Old records reviewed (if available). Labs and imaging reviewed personally by myself and considered in medical decision making if ordered.   Clinical Impression: 1. Fall, initial encounter   2. Seizure     Disposition: Discharge  Condition: Good  I have discussed the results, Dx and Tx plan with the pt(& family if present). He/she/they expressed understanding and agree(s) with the plan. Discharge instructions discussed at great length. Strict return precautions discussed and pt &/or family have verbalized understanding of the instructions. No further questions at time of discharge.    New Prescriptions   No medications on file    Follow Up: Ron Parker, MD      Pt seen in conjunction with Dr. Eber Hong, MD  Ames Dura, DO [facility] Emergency Medicine Resident - PGY-3     Ames Dura, MD 05/14/15 2325  Eber Hong, MD 05/14/15 [phone]

## 2015-05-15 ENCOUNTER — Inpatient Hospital Stay (HOSPITAL_COMMUNITY)
Admit: 2015-05-15 | Discharge: 2015-05-15 | Disposition: A | Discharge status: Home / Self Care | Payer: Medicare Other | Attending: Neurology | Admitting: Neurology

## 2015-05-15 ENCOUNTER — Encounter (HOSPITAL_COMMUNITY): Payer: Self-pay | Admitting: Emergency Medicine

## 2015-05-15 DIAGNOSIS — W050XXA Fall from non-moving wheelchair, initial encounter: Secondary | ICD-10-CM | POA: Diagnosis present

## 2015-05-15 DIAGNOSIS — G4089 Other seizures: Secondary | ICD-10-CM | POA: Diagnosis not present

## 2015-05-15 DIAGNOSIS — E785 Hyperlipidemia, unspecified: Secondary | ICD-10-CM | POA: Diagnosis not present

## 2015-05-15 DIAGNOSIS — G40909 Epilepsy, unspecified, not intractable, without status epilepticus: Secondary | ICD-10-CM | POA: Diagnosis not present

## 2015-05-15 DIAGNOSIS — W19XXXA Unspecified fall, initial encounter: Secondary | ICD-10-CM

## 2015-05-15 DIAGNOSIS — Y92099 Unspecified place in other non-institutional residence as the place of occurrence of the external cause: Secondary | ICD-10-CM | POA: Diagnosis not present

## 2015-05-15 DIAGNOSIS — E559 Vitamin D deficiency, unspecified: Secondary | ICD-10-CM | POA: Diagnosis present

## 2015-05-15 DIAGNOSIS — G219 Secondary parkinsonism, unspecified: Secondary | ICD-10-CM | POA: Diagnosis not present

## 2015-05-15 DIAGNOSIS — I10 Essential (primary) hypertension: Secondary | ICD-10-CM | POA: Diagnosis not present

## 2015-05-15 DIAGNOSIS — Z79899 Other long term (current) drug therapy: Secondary | ICD-10-CM | POA: Diagnosis not present

## 2015-05-15 DIAGNOSIS — R569 Unspecified convulsions: Secondary | ICD-10-CM | POA: Diagnosis not present

## 2015-05-15 DIAGNOSIS — F79 Unspecified intellectual disabilities: Secondary | ICD-10-CM | POA: Diagnosis not present

## 2015-05-15 DIAGNOSIS — E669 Obesity, unspecified: Secondary | ICD-10-CM | POA: Diagnosis present

## 2015-05-15 DIAGNOSIS — F419 Anxiety disorder, unspecified: Secondary | ICD-10-CM | POA: Diagnosis present

## 2015-05-15 DIAGNOSIS — Z888 Allergy status to other drugs, medicaments and biological substances status: Secondary | ICD-10-CM | POA: Diagnosis not present

## 2015-05-15 DIAGNOSIS — Z6833 Body mass index (BMI) 33.0-33.9, adult: Secondary | ICD-10-CM | POA: Diagnosis not present

## 2015-05-15 DIAGNOSIS — G218 Other secondary parkinsonism: Secondary | ICD-10-CM | POA: Diagnosis not present

## 2015-05-15 LAB — CBC
HCT: 34.3 % — ABNORMAL LOW (ref 36.0–46.0)
HEMOGLOBIN: 11.7 g/dL — AB (ref 12.0–15.0)
MCH: 29.6 pg (ref 26.0–34.0)
MCHC: 34.1 g/dL (ref 30.0–36.0)
MCV: 86.8 fL (ref 78.0–100.0)
PLATELETS: 251 10*3/uL (ref 150–400)
RBC: 3.95 MIL/uL (ref 3.87–5.11)
RDW: 12.2 % (ref 11.5–15.5)
WBC: 6.9 10*3/uL (ref 4.0–10.5)

## 2015-05-15 LAB — BASIC METABOLIC PANEL
ANION GAP: 10 (ref 5–15)
BUN: 8 mg/dL (ref 6–20)
CALCIUM: 9.4 mg/dL (ref 8.9–10.3)
CO2: 26 mmol/L (ref 22–32)
CREATININE: 0.51 mg/dL (ref 0.44–1.00)
Chloride: 108 mmol/L (ref 101–111)
Glucose, Bld: 104 mg/dL — ABNORMAL HIGH (ref 65–99)
Potassium: 3.3 mmol/L — ABNORMAL LOW (ref 3.5–5.1)
SODIUM: 144 mmol/L (ref 135–145)

## 2015-05-15 LAB — URINALYSIS, ROUTINE W REFLEX MICROSCOPIC
Glucose, UA: 100 mg/dL — AB
Ketones, ur: 15 mg/dL — AB
Leukocytes, UA: NEGATIVE
Nitrite: NEGATIVE
PROTEIN: 100 mg/dL — AB
Specific Gravity, Urine: 1.028 (ref 1.005–1.030)
UROBILINOGEN UA: 1 mg/dL (ref 0.0–1.0)
pH: 5 (ref 5.0–8.0)

## 2015-05-15 LAB — URINE MICROSCOPIC-ADD ON

## 2015-05-15 LAB — PHENOBARBITAL LEVEL: PHENOBARBITAL: 11.9 ug/mL — AB (ref 15.0–40.0)

## 2015-05-15 LAB — GLUCOSE, CAPILLARY: GLUCOSE-CAPILLARY: 98 mg/dL (ref 65–99)

## 2015-05-15 LAB — MRSA PCR SCREENING: MRSA BY PCR: POSITIVE — AB

## 2015-05-15 MED ORDER — ACETAMINOPHEN 325 MG PO TABS: 650.0000 mg | ORAL_TABLET | Freq: Four times a day (QID) | ORAL | Status: DC | PRN

## 2015-05-15 MED ORDER — ACETAMINOPHEN 650 MG RE SUPP: 650.0000 mg | Freq: Four times a day (QID) | RECTAL | Status: DC | PRN

## 2015-05-15 MED ORDER — PHENOBARBITAL 32.4 MG PO TABS
129.6000 mg | ORAL_TABLET | ORAL | Status: DC
  Administered 2015-05-16: 129.6 mg via ORAL
  Filled 2015-05-15: qty 4

## 2015-05-15 MED ORDER — LEVETIRACETAM 500 MG/5ML IV SOLN
1000.0000 mg | Freq: Once | INTRAVENOUS | Status: AC
  Administered 2015-05-15: 1000 mg via INTRAVENOUS
  Filled 2015-05-15: qty 10

## 2015-05-15 MED ORDER — SODIUM CHLORIDE 0.9 % IV SOLN
INTRAVENOUS | Status: AC
  Administered 2015-05-15: 04:00:00 via INTRAVENOUS

## 2015-05-15 MED ORDER — LEVETIRACETAM 500 MG/5ML IV SOLN
1000.0000 mg | INTRAVENOUS | Status: DC
  Administered 2015-05-15 – 2015-05-16 (×2): 1000 mg via INTRAVENOUS
  Filled 2015-05-15 (×3): qty 10

## 2015-05-15 MED ORDER — OXYCODONE HCL 5 MG PO TABS
5.0000 mg | ORAL_TABLET | ORAL | Status: DC | PRN
  Administered 2015-05-16: 5 mg via ORAL
  Filled 2015-05-15: qty 1

## 2015-05-15 MED ORDER — CLONAZEPAM 0.5 MG PO TABS
0.5000 mg | ORAL_TABLET | Freq: Two times a day (BID) | ORAL | Status: DC
  Administered 2015-05-15 – 2015-05-17 (×5): 0.5 mg via ORAL
  Filled 2015-05-15 (×5): qty 1

## 2015-05-15 MED ORDER — SODIUM CHLORIDE 0.9 % IV SOLN
1500.0000 mg | Freq: Two times a day (BID) | INTRAVENOUS | Status: DC
  Administered 2015-05-15: 1500 mg via INTRAVENOUS
  Filled 2015-05-15 (×2): qty 15

## 2015-05-15 MED ORDER — SODIUM CHLORIDE 0.9 % IV SOLN
1500.0000 mg | INTRAVENOUS | Status: DC
  Administered 2015-05-16 – 2015-05-17 (×2): 1500 mg via INTRAVENOUS
  Filled 2015-05-15 (×2): qty 15

## 2015-05-15 MED ORDER — HYDROMORPHONE HCL 1 MG/ML IJ SOLN: 0.5000 mg | INTRAMUSCULAR | Status: DC | PRN

## 2015-05-15 MED ORDER — SODIUM CHLORIDE 0.9 % IJ SOLN
3.0000 mL | Freq: Two times a day (BID) | INTRAMUSCULAR | Status: DC
  Administered 2015-05-15 – 2015-05-17 (×4): 3 mL via INTRAVENOUS

## 2015-05-15 MED ORDER — ONDANSETRON HCL 4 MG/2ML IJ SOLN: 4.0000 mg | Freq: Four times a day (QID) | INTRAMUSCULAR | Status: DC | PRN

## 2015-05-15 MED ORDER — LORAZEPAM 2 MG/ML IJ SOLN: 1.0000 mg | Freq: Four times a day (QID) | INTRAMUSCULAR | Status: DC | PRN

## 2015-05-15 MED ORDER — ONDANSETRON HCL 4 MG PO TABS: 4.0000 mg | ORAL_TABLET | Freq: Four times a day (QID) | ORAL | Status: DC | PRN

## 2015-05-15 MED ORDER — PHENOBARBITAL 64.8 MG PO TABS: 64.8000 mg | ORAL_TABLET | ORAL | Status: DC

## 2015-05-15 MED ORDER — PHENOBARBITAL 32.4 MG PO TABS
194.4000 mg | ORAL_TABLET | ORAL | Status: DC
  Administered 2015-05-16: 194.4 mg via ORAL
  Filled 2015-05-15: qty 6

## 2015-05-15 MED ORDER — ENOXAPARIN SODIUM 40 MG/0.4ML ~~LOC~~ SOLN
40.0000 mg | Freq: Every day | SUBCUTANEOUS | Status: DC
  Administered 2015-05-15 – 2015-05-17 (×3): 40 mg via SUBCUTANEOUS
  Filled 2015-05-15 (×4): qty 0.4

## 2015-05-15 NOTE — ED Notes (Signed)
Dr. Camillo at bedside. 

## 2015-05-15 NOTE — ED Notes (Signed)
Pt found to be more alert, pt removed all of cardiac monitor and also removed IV. Urinary brief changed. Pt following commands and able to state name

## 2015-05-15 NOTE — Consult Note (Addendum)
NEURO HOSPITALIST CONSULT NOTE   Referring physician: ED Reason for Consult: seizures  HPI:                                                                                                                                          Christine Pena is an 59 y.o. female with a past medical history significant for MR, HTN, hyperlipidemia, dementia, parkinsonism, and seizures who was initially sent to the ED after sustaining a fall followed by a seizure at the nursing home and after having unrevealing testing in the ED was about to be discharge back to the nursing home and then had another witnessed GTC seizure. Patient is follow by Dr Stephanie Acre at [facility] and according to his last outpatient note from 05/04/15 she should be taking " phenobarbital 64.8 milligrams tablets, 3 tablets on Monday, Wednesday, and Friday, 2 tablets all other nights of the week and Keppra taking 1000 mg twice daily.MRI brain evaluation has been done in March 2016. This did not show a definite source of seizures. The patient has had EEG evaluations on 2 occasions, once on 10/10/2014 which was normal. An EEG study done on 02/05/2015 showed FIRDA". CT brain and available serologies today were independently reviewed and unrevealing. Presently, she is drowsy, open her eyes but is non verbal and does not follow commands (seems to be her baseline). Receiving a loading dose 1 gram IV now.   Past Medical History  Diagnosis Date  . Hypertension   . Seizures   . Mental retardation   . Hyperlipidemia   . Anxiety   . Vitamin D deficiency   . Obesity   . Dementia   . 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 No pertinent past surgical history.  Family History  Problem Relation Age of Onset  . Heart disease Mother     Family History: unable to obtain due to mental status  Social History:  reports that she has never smoked. She has never used smokeless tobacco. She reports that she does not  drink alcohol or use illicit drugs.  Allergies  Allergen Reactions  . Depakote [Valproic Acid]     altered mental status/ high ammonia  . Haldol [Haloperidol Lactate]     Unknown reaction.    MEDICATIONS:  I have reviewed the patient's current medications.   ROS: unable to obtain due to mental status                                                                                                                                     History obtained from chart review  Physical exam:  Constitutional: well developed, pleasant female in no apparent distress. Blood pressure 144/68, pulse 64, temperature 98.2 F (36.8 C), temperature source Oral, resp. rate 17, SpO2 92 %. Eyes: no jaundice or exophthalmos.  Head: normocephalic. Neck: supple, no bruits, no JVD. Cardiac: no murmurs. Lungs: clear. Abdomen: soft, no tender, no mass. Extremities: no edema, clubbing, or cyanosis.  Skin: no rash  Neurologic Examination:                                                                                                      General: Mental Status: Drowsy but open eyes. Non verbal. Does not follow commands. Cranial Nerves: II: Discs flat bilaterally; Visual fields grossly normal, pupils equal, round, reactive to light and accommodation III,IV, VI: ptosis not present, extra-ocular motions intact bilaterally V,VII: smile symmetric, facial light touch sensation normal bilaterally VIII: hearing normal bilaterally IX,X: uvula rises symmetrically XI: bilateral shoulder shrug no tested XII: midline tongue extension without atrophy or fasciculations  Motor: Moves all limbs symmetrically Tone and bulk:normal tone throughout; no atrophy noted Sensory: reacts to noxious stimuli Deep Tendon Reflexes:  2 all over Plantars: Right: downgoing   Left: downgoing Cerebellar: Unable to test as  she is not follow commands.Patient exhibits bilateral resting and action tremor Gait: Unable to test due to mental status.    No results found for: CHOL  Results for orders placed or performed during the hospital encounter of 05/14/15 (from the past 48 hour(s))  CBC with Differential     Status: None   Collection Time: 05/14/15 10:12 PM  Result Value Ref Range   WBC 8.1 4.0 - 10.5 K/uL   RBC 4.28 3.87 - 5.11 MIL/uL   Hemoglobin 12.9 12.0 - 15.0 g/dL   HCT 16.1 09.6 - 04.5 %   MCV 86.9 78.0 - 100.0 fL   MCH 30.1 26.0 - 34.0 pg   MCHC 34.7 30.0 - 36.0 g/dL   RDW 40.9 81.1 - 91.4 %   Platelets 285 150 - 400 K/uL   Neutrophils Relative % 77 43 - 77 %   Neutro Abs 6.2 1.7 - 7.7 K/uL   Lymphocytes Relative 16 12 -  46 %   Lymphs Abs 1.3 0.7 - 4.0 K/uL   Monocytes Relative 7 3 - 12 %   Monocytes Absolute 0.6 0.1 - 1.0 K/uL   Eosinophils Relative 0 0 - 5 %   Eosinophils Absolute 0.0 0.0 - 0.7 K/uL   Basophils Relative 0 0 - 1 %   Basophils Absolute 0.0 0.0 - 0.1 K/uL  I-stat Chem 8, ED     Status: Abnormal   Collection Time: 05/14/15 10:27 PM  Result Value Ref Range   Sodium 144 135 - 145 mmol/L   Potassium 3.5 3.5 - 5.1 mmol/L   Chloride 104 101 - 111 mmol/L   BUN 11 6 - 20 mg/dL   Creatinine, Ser 1.61 0.44 - 1.00 mg/dL   Glucose, Bld 096 (H) 65 - 99 mg/dL   Calcium, Ion 0.45 (H) 1.12 - 1.23 mmol/L   TCO2 28 0 - 100 mmol/L   Hemoglobin 12.9 12.0 - 15.0 g/dL   HCT 40.9 81.1 - 91.4 %  CBG monitoring, ED     Status: Abnormal   Collection Time: 05/14/15 11:06 PM  Result Value Ref Range   Glucose-Capillary 134 (H) 65 - 99 mg/dL    Ct Head Wo Contrast  05/14/2015   CLINICAL DATA:  Trauma, injury. Fall forward from wheelchair landing on face. Evaluate for intracranial bleed or cervical spine injury.  EXAM: CT HEAD WITHOUT CONTRAST  CT CERVICAL SPINE WITHOUT CONTRAST  TECHNIQUE: Multidetector CT imaging of the head and cervical spine was performed following the standard protocol  without intravenous contrast. Multiplanar CT image reconstructions of the cervical spine were also generated.  COMPARISON:  Most recent head and cervical spine CT 01/28/2015  FINDINGS: CT HEAD FINDINGS  No intracranial hemorrhage, mass effect, or midline shift. No hydrocephalus. The basilar cisterns are patent. No evidence of territorial infarct. No intracranial fluid collection. Cerebellar atrophy is unchanged. Questionable right frontal scalp swelling. Calvarium is intact. Included paranasal sinuses and mastoid air cells are well aerated.  CT CERVICAL SPINE FINDINGS  Cervical spine alignment is maintained. Vertebral body heights preserved. There is no fracture. The dens is intact. There are no jumped or perched facets. Mild diffuse disc space narrowing and endplate spurring most prominent in the lower cervical spine. Prominent Schmorl's node involving inferior endplate of T2. No prevertebral soft tissue edema.  IMPRESSION: 1. No acute intracranial abnormality. Cerebellar atrophy is unchanged. 2. No acute fracture or subluxation in the cervical spine.   Electronically Signed   By: Rubye Oaks M.D.   On: 05/14/2015 23:13   Ct Cervical Spine Wo Contrast  05/14/2015   CLINICAL DATA:  Trauma, injury. Fall forward from wheelchair landing on face. Evaluate for intracranial bleed or cervical spine injury.  EXAM: CT HEAD WITHOUT CONTRAST  CT CERVICAL SPINE WITHOUT CONTRAST  TECHNIQUE: Multidetector CT imaging of the head and cervical spine was performed following the standard protocol without intravenous contrast. Multiplanar CT image reconstructions of the cervical spine were also generated.  COMPARISON:  Most recent head and cervical spine CT 01/28/2015  FINDINGS: CT HEAD FINDINGS  No intracranial hemorrhage, mass effect, or midline shift. No hydrocephalus. The basilar cisterns are patent. No evidence of territorial infarct. No intracranial fluid collection. Cerebellar atrophy is unchanged. Questionable right  frontal scalp swelling. Calvarium is intact. Included paranasal sinuses and mastoid air cells are well aerated.  CT CERVICAL SPINE FINDINGS  Cervical spine alignment is maintained. Vertebral body heights preserved. There is no fracture. The dens is intact. There are  no jumped or perched facets. Mild diffuse disc space narrowing and endplate spurring most prominent in the lower cervical spine. Prominent Schmorl's node involving inferior endplate of T2. No prevertebral soft tissue edema.  IMPRESSION: 1. No acute intracranial abnormality. Cerebellar atrophy is unchanged. 2. No acute fracture or subluxation in the cervical spine.   Electronically Signed   By: Rubye Oaks M.D.   On: 05/14/2015 23:13   Dg Chest Portable 1 View  05/14/2015   CLINICAL DATA:  Fall with seizure.  Evaluate bleed.  EXAM: PORTABLE CHEST - 1 VIEW  COMPARISON:  Chest radiograph 02/15/2015  FINDINGS: Lung volumes remain low. Patient is rotated. Heart at the upper limits of normal in size. Mild vascular congestion without overt edema. Mild bibasilar atelectasis, left greater than right. No large pleural effusion. No evident pneumothorax. No acute osseous abnormalities are seen.  IMPRESSION: 1. No evidence of acute traumatic injury allowing for low lung volumes and rotation. 2. Mild vascular congestion and bibasilar atelectasis.   Electronically Signed   By: Rubye Oaks M.D.   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 She is drowsy but has a non focal exam. CT brain negative for acute abnormality. Recommend: 1) Loading with 1 gram IV keppra. 2) Increase keppra to 1,[id number] mg BID 3) Check phenobarbital level and adjust maintenance dose if needed. Will follow up.   Wyatt Portela, MD 05/15/2015, 1:38 AM  [facility]

## 2015-05-15 NOTE — Procedures (Signed)
ELECTROENCEPHALOGRAM REPORT  Patient: [facility]       Room #: 5M20 EEG No. ID: [id number] Age: 59 y.o.        Sex: female Referring Physician: Mort Sawyers Report Date:  05/15/2015        Interpreting Physician: Aline Brochure  History: EARA BURRUEL is an 59 y.o. female with a history of mental retardation and seizure disorder presenting with recurrent generalized seizures.  Indications for study:  Rule out ongoing seizure activity.  Technique: This is an 18 channel routine scalp EEG performed at the bedside with bipolar and monopolar montages arranged in accordance to the international 10/20 system of electrode placement.   Description: This EEG recording was performed during wakefulness and during light sleep. Urine wakefulness the predominant activity consisted of low amplitude diffuse irregular delta activity with superimposed rhythmic activity diffusely in the 7-8 Hz range. Photic stimulation produced symmetrical occipital driving response. Hyperventilation not performed. During light sleep symmetrical vertex waves and sleep spindles were recorded, in addition to increased slowing in background activity in baseline. No epileptiform discharges were recorded.  Interpretation: This EEG is abnormal with mild generalized nonspecific continuous slowing of cerebral activity. No evidence of seizure activity was recorded.   Venetia Maxon M.D. [facility] [phone]

## 2015-05-15 NOTE — ED Notes (Signed)
Pt alert to name only; able to follow some commands such as opening mouth for repeat temperature and to assess possible oral trauma. No trauma noted to tongue. Pt attempting to verbalize needs; speech incomprehensible. Pt resting with eyes closed

## 2015-05-15 NOTE — Progress Notes (Signed)
Patient is seen &examined. Please see today's H&P for the details. 59 y/o female with PMH of HTN, Dementia, Parkinson's Disease, Mental Retardation, Seizures is presented from SNF with episode of a seizure, she another episode of GTC  in ED, loaded with IV Keppra. Phenytoin level is low. No new episode of seizure, mental status improved likely at baseline.  -admitted for seizure, medications are being adjusted by neurology. Cont monitor    Esperanza Sheets  [facility] Pager [phone]. If 7PM-7AM, please contact night-coverage at [email or web address], password [facility] 05/15/2015, 9:24 AM  LOS: 0 days

## 2015-05-15 NOTE — ED Notes (Signed)
Christine Pena at [facility] updated on admission

## 2015-05-15 NOTE — ED Provider Notes (Signed)
CSN: [id number]     Arrival date & time 05/14/15  2126 History   This chart was scribed for Christine Gavin, MD by Christine Pena, ED Scribe. This patient was seen in room D30C/D30C and the patient's care was started 1:10 AM.   Chief Complaint  Patient presents with  . Fall   The history is provided by the nursing home, medical records and the EMS personnel. The history is limited by the condition of the patient. No language interpreter was used.    LEVEL 5 CAVEAT DUE TO CONDITION   HPI Comments: Christine Pena is a 59 y.o. female with a PMHx of mental retardation, dementia, parkinson's disease, tremors, and seizures who presents to the Emergency Department here after a fall sustained 1-2 hours prior to arrival. Per previous note, pt fell face forward from her wheelchair followed by a witnessed seizure that lasted 6-8 minutes in duration. CT performed during initial visit this evening with no abnormal findings. Pt then sustained a second seizure lasting approximately 3 minutes as she was waiting for transport back to nursing facility after discharge this evening. Christine Pena is non-verbal and bed ridden at baseline. Pt with known allergies to Depakote and Haldol.   Past Medical History  Diagnosis Date  . Hypertension   . Seizures   . Mental retardation   . Hyperlipidemia   . Anxiety   . Vitamin D deficiency   . Obesity   . Dementia   . 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 No pertinent past surgical history. Family History  Problem Relation Age of Onset  . Heart disease Mother    Social History  Substance Use Topics  . Smoking status: Never Smoker   . Smokeless tobacco: Never Used  . Alcohol Use: No   OB History    No data available     Review of Systems  Unable to perform ROS: Other      Allergies  Depakote and Haldol  Home Medications   Prior to Admission medications   Medication Sig Start Date End Date Taking? Authorizing Provider   acetaminophen (TYLENOL) 500 MG tablet Take 500 mg by mouth every 6 (six) hours as needed.    Historical Provider, MD  alum & mag hydroxide-simeth (MAALOX/MYLANTA) 200-200-20 MG/5ML suspension Take 30 mLs by mouth every 6 (six) hours as needed for indigestion or heartburn.    Historical Provider, MD  benztropine (COGENTIN) 1 MG tablet Take 1 mg by mouth 2 (two) times daily.    Historical Provider, MD  chlorhexidine (PERIDEX) 0.12 % solution Use as directed 10 mLs in the mouth or throat 2 (two) times daily.     Historical Provider, MD  clonazePAM (KLONOPIN) 1 MG tablet Take 1 mg by mouth 2 (two) times daily.    Historical Provider, MD  furosemide (LASIX) 20 MG tablet Take 20 mg by mouth 2 (two) times daily.    Historical Provider, MD  guaifenesin (ROBITUSSIN) 100 MG/5ML syrup Take 200 mg by mouth 3 (three) times daily as needed for cough.    Historical Provider, MD  levETIRAcetam (KEPPRA) 1000 MG tablet Take 1 tablet (1,000 mg total) by mouth See admin instructions. Take 1.5 tablet in morning and 1 table in evening. 05/14/15   Christine Dura, MD  loperamide (IMODIUM) 2 MG capsule Take 2 mg by mouth as needed for diarrhea or loose stools.    Historical Provider, MD  magnesium hydroxide (MILK OF MAGNESIA) 400 MG/5ML suspension Take 30 mLs by mouth daily  as needed for mild constipation.    Historical Provider, MD  miconazole (MICOTIN) 2 % cream Apply 1 application topically 2 (two) times daily.    Historical Provider, MD  moexipril (UNIVASC) 7.5 MG tablet Take 7.5 mg by mouth daily.  12/30/13   Historical Provider, MD  oxybutynin (DITROPAN-XL) 5 MG 24 hr tablet Take 5 mg by mouth daily.    Historical Provider, MD  PHENobarbital (LUMINAL) 64.8 MG tablet Take 2 tablets (129.6 mg total) by mouth at bedtime. Patient taking differently: Take 64.8 mg by mouth at bedtime. Patient takes 2 tablets Tuesday, Thursday, Saturday and Sunday. She takes 3 tablets Monday, Wednesday, Friday. 10/11/14   Christine Bender, NP   pravastatin (PRAVACHOL) 40 MG tablet Take 40 mg by mouth daily.    Historical Provider, MD  QUEtiapine (SEROQUEL) 50 MG tablet Take 1 tablet (50 mg total) by mouth at bedtime. Patient taking differently: Take 100 mg by mouth 2 (two) times daily.  10/11/14   Christine Bender, NP  simvastatin (ZOCOR) 20 MG tablet Take 20 mg by mouth at bedtime.  12/30/13   Historical Provider, MD  thiothixene (NAVANE) 10 MG capsule Take 10 mg by mouth 3 (three) times daily.    Historical Provider, MD  Vitamin D, Ergocalciferol, (DRISDOL) 50000 UNITS CAPS capsule Take 50,000 Units by mouth every 7 (seven) days. Takes on Saturdays.    Historical Provider, MD  vitamin E (VITAMIN E) 200 UNIT capsule Take 200 Units by mouth daily.    Historical Provider, MD   Triage Vitals: BP 144/68 mmHg  Pulse 64  Temp(Src) 98.2 F (36.8 C) (Oral)  Resp 17  SpO2 92%   Physical Exam  Constitutional: She appears well-developed and well-nourished. No distress.  HENT:  Head: Normocephalic and atraumatic.  Eyes: EOM are normal.  Neck: Normal range of motion.  Cardiovascular: Normal rate, regular rhythm and normal heart sounds.   Pulmonary/Chest: Effort normal and breath sounds normal. No respiratory distress. She has no wheezes. She has no rales.  Abdominal: Soft. She exhibits no distension. There is no tenderness. There is no rebound.  Musculoskeletal: Normal range of motion.  Neurological: She has normal reflexes.  Skin: Skin is warm and dry.  Psychiatric:  unable  Nursing note and vitals reviewed.   ED Course  Procedures (including critical care time)  DIAGNOSTIC STUDIES: Oxygen Saturation is 92% on RA, adequate by my interpretation.    COORDINATION OF CARE: 1:10 AM- Will give Keppra. Discussed treatment plan with pt at bedside and pt agreed to plan.     1:48 AM- Consulted with Dr. Elige Ko on Neurology. Plan includes IV Keppra load and admit to medicine.  Labs Review Labs Reviewed  CBG MONITORING, ED - Abnormal;  Notable for the following:    Glucose-Capillary 134 (*)    All other components within normal limits  I-STAT CHEM 8, ED - Abnormal; Notable for the following:    Glucose, Bld 116 (*)    Calcium, Ion 1.24 (*)    All other components within normal limits  CBC WITH DIFFERENTIAL/PLATELET    Imaging Review Ct Head Wo Contrast  05/14/2015   CLINICAL DATA:  Trauma, injury. Fall forward from wheelchair landing on face. Evaluate for intracranial bleed or cervical spine injury.  EXAM: CT HEAD WITHOUT CONTRAST  CT CERVICAL SPINE WITHOUT CONTRAST  TECHNIQUE: Multidetector CT imaging of the head and cervical spine was performed following the standard protocol without intravenous contrast. Multiplanar CT image reconstructions of the cervical spine were  also generated.  COMPARISON:  Most recent head and cervical spine CT 01/28/2015  FINDINGS: CT HEAD FINDINGS  No intracranial hemorrhage, mass effect, or midline shift. No hydrocephalus. The basilar cisterns are patent. No evidence of territorial infarct. No intracranial fluid collection. Cerebellar atrophy is unchanged. Questionable right frontal scalp swelling. Calvarium is intact. Included paranasal sinuses and mastoid air cells are well aerated.  CT CERVICAL SPINE FINDINGS  Cervical spine alignment is maintained. Vertebral body heights preserved. There is no fracture. The dens is intact. There are no jumped or perched facets. Mild diffuse disc space narrowing and endplate spurring most prominent in the lower cervical spine. Prominent Schmorl's node involving inferior endplate of T2. No prevertebral soft tissue edema.  IMPRESSION: 1. No acute intracranial abnormality. Cerebellar atrophy is unchanged. 2. No acute fracture or subluxation in the cervical spine.   Electronically Signed   By: Rubye Oaks M.D.   On: 05/14/2015 23:13   Ct Cervical Spine Wo Contrast  05/14/2015   CLINICAL DATA:  Trauma, injury. Fall forward from wheelchair landing on face. Evaluate  for intracranial bleed or cervical spine injury.  EXAM: CT HEAD WITHOUT CONTRAST  CT CERVICAL SPINE WITHOUT CONTRAST  TECHNIQUE: Multidetector CT imaging of the head and cervical spine was performed following the standard protocol without intravenous contrast. Multiplanar CT image reconstructions of the cervical spine were also generated.  COMPARISON:  Most recent head and cervical spine CT 01/28/2015  FINDINGS: CT HEAD FINDINGS  No intracranial hemorrhage, mass effect, or midline shift. No hydrocephalus. The basilar cisterns are patent. No evidence of territorial infarct. No intracranial fluid collection. Cerebellar atrophy is unchanged. Questionable right frontal scalp swelling. Calvarium is intact. Included paranasal sinuses and mastoid air cells are well aerated.  CT CERVICAL SPINE FINDINGS  Cervical spine alignment is maintained. Vertebral body heights preserved. There is no fracture. The dens is intact. There are no jumped or perched facets. Mild diffuse disc space narrowing and endplate spurring most prominent in the lower cervical spine. Prominent Schmorl's node involving inferior endplate of T2. No prevertebral soft tissue edema.  IMPRESSION: 1. No acute intracranial abnormality. Cerebellar atrophy is unchanged. 2. No acute fracture or subluxation in the cervical spine.   Electronically Signed   By: Rubye Oaks M.D.   On: 05/14/2015 23:13   Dg Chest Portable 1 View  05/14/2015   CLINICAL DATA:  Fall with seizure.  Evaluate bleed.  EXAM: PORTABLE CHEST - 1 VIEW  COMPARISON:  Chest radiograph 02/15/2015  FINDINGS: Lung volumes remain low. Patient is rotated. Heart at the upper limits of normal in size. Mild vascular congestion without overt edema. Mild bibasilar atelectasis, left greater than right. No large pleural effusion. No evident pneumothorax. No acute osseous abnormalities are seen.  IMPRESSION: 1. No evidence of acute traumatic injury allowing for low lung volumes and rotation. 2. Mild  vascular congestion and bibasilar atelectasis.   Electronically Signed   By: Rubye Oaks M.D.   On: 05/14/2015 22:41   I have personally reviewed and evaluated these images and lab results as part of my medical decision-making.   EKG Interpretation None      MDM   Final diagnoses:  Fall, initial encounter  Seizure    Seen by neurology keppra loaded admit to medicine tele I personally performed the services described in this documentation, which was scribed in my presence. The recorded information has been reviewed and is accurate.    Cy Blamer, MD 05/15/15 [phone]

## 2015-05-15 NOTE — Progress Notes (Signed)
STAT EEG completed; results pending. 

## 2015-05-15 NOTE — Progress Notes (Signed)
PHARMACY CONSULT NOTE - Initial Consult  Pharmacy Consult for phenobarbital Indication: seizure disorder  Allergies  Allergen Reactions  . Depakote [Valproic Acid]     altered mental status/ high ammonia  . Haldol [Haloperidol Lactate]     Unknown reaction.    Vital Signs: Temp: 98.3 F (36.8 C) (08/22 1051) Temp Source: Oral (08/22 1051) BP: 127/72 mmHg (08/22 1051) Pulse Rate: 75 (08/22 1051)  Labs:  Recent Labs  05/14/15 2212 05/14/15 2227 05/15/15 0911  HGB 12.9 12.9 11.7*  HCT 37.2 38.0 34.3*  PLT 285  --  251  CREATININE  --  0.60 0.51    CrCl cannot be calculated (Unknown ideal weight.).   Medical History: Past Medical History  Diagnosis Date  . Hypertension   . Seizures   . Mental retardation   . Hyperlipidemia   . Anxiety   . Vitamin D deficiency   . Obesity   . Dementia   . 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 female with a history of seizure disorder who fell out of her wheelchair at her SNF and had reported seizure-like activity. Patient had witnessed generalized tonic clonic activity with left-sided gaze lasting about 90 seconds. Patient was on phenobarbital prior to admission. Patient was loaded with levetiracetam.   Current phenobarbital level = 11.9, which is SUBtherapeutic  PTA phenobarbital dose: 64.8 mg tablets - 2 tablets (129.6 mg) TTSS and 3 tablets MWF (194.4 mg)  Per Care Everywhere, patient was therapeutic on this dose a month ago with a level of 17.   Goal of Therapy:  Prevention of further seizure activity Phenobarbital goal 15-30 mcg/mL  Plan:  - Patient has already been loaded on levetiracetam - Would recommend against changing the phenobarbital dose as the levetiracetam dose has already been changed and then changing the phenobarbital dose later if needed. Phenobarbital has a lot of side effects, especially if levels >30 so consider monitoring levels and adjusting if needed  (patient has been therapeutic on this dose).  - If physician team would still like to increase phenobarbital dose, would recommend load of phenobarbital 259.2 mg (4 tablets of 64.8 mg) x 1 and then restarting previous dosing or if sure patient has not missed any doses could increase to 194.4 mg daily with close follow up of phenobarbital levels. - Unable to reach neurology with this recommendation, will continue to attempt.    Juanita Craver, PharmD, BCPS Clinical Pharmacist [phone]

## 2015-05-15 NOTE — ED Notes (Signed)
Pt moved from exam room to hallway while awaiting transport. Pt screamed then had a full body convulsion with posturing and L sided gaze lasting approx. 90 secs. VS stable during seizure activity. Dr.Palumbo called to bedside; pt drooling from left side of mouth, airway suctioned.

## 2015-05-15 NOTE — Progress Notes (Signed)
Pt arrived to 5M20 from ED @ 1055, Pt A&Ox 2, c/o pain 0/10. No dressing , no drains skin intact has reddened area to scalp and hairline on the right side due to preadmission fall. Pt VS taken.  Fluids runningat 75 cc/hr.  Pt without distress. No family at the bedside. Will continue to monitor.

## 2015-05-15 NOTE — Care Management Note (Signed)
Case Management Note  Patient Details  Name: Christine Pena MRN: [id number] Date of Birth: August 01, 1956  Subjective/Objective:                    Action/Plan: Patient was admitted with seizures. Resides at [facility].  CSW aware. Patient was active with [facility] prior to admission. CM will continue to follow.  Expected Discharge Date:                  Expected Discharge Plan:  Assisted Living / Rest Home [facility])  In-House Referral:  Clinical Social Work  Discharge planning Services     Post Acute Care Choice:  Home Health Choice offered to:     DME Arranged:    DME Agency:     HH Arranged:  PT HH Agency:  [facility]  Status of Service:  In process, will continue to follow  Medicare Important Message Given:    Date Medicare IM Given:    Medicare IM give by:    Date Additional Medicare IM Given:    Additional Medicare Important Message give by:     If discussed at Long Length of Stay Meetings, dates discussed:    Additional Comments:  Anda Kraft, RN 05/15/2015, 2:43 PM

## 2015-05-15 NOTE — Progress Notes (Signed)
Subjective: patient in bed non con versive.   Exam: Filed Vitals:   05/15/15 1051  BP: 127/72  Pulse: 75  Temp: 98.3 F (36.8 C)  Resp: 20    HEENT-  Normocephalic, no lesions, without obvious abnormality.  Normal external eye and conjunctiva.  Normal TM's bilaterally.  Normal auditory canals and external ears. Normal external nose, mucus membranes and septum.  Normal pharynx. Cardiovascular- S1, S2 normal, pulses palpable throughout   Lungs- chest clear, no wheezing, rales, normal symmetric air entry Abdomen- normal findings: bowel sounds normal Extremities- no edema     Gen: In bed, NAD MS: alert but not following commands or conversive.  When asked questions will mumble no coherent speech.  CN: PERRLA, blinks to threat bilaterally and tract my movements, face symmetrical Motor: moving all extremities antigravity spontaneously and purposefully but not to command.  Sensory: withdraws from pain all extremities.  DTR: depressed throughout  Pertinent Labs: Phenobarbital 11.9  K+ 3.3 EEG pending Felicie Morn PA-C [facility] [phone]  Impression: Break through seizure in setting of sub therapeutic Phenobarbital. today EEG shows no seizure activity.    Recommendations: 1) continue with current dose of Keppra 2) phenobarbital per pharmacy  Will continue to follow    05/15/2015, 11:21 AM

## 2015-05-15 NOTE — ED Notes (Signed)
Attempted report 

## 2015-05-15 NOTE — H&P (Signed)
[facility] Admission History and Physical       Christine Pena ZOX:[id number] DOB: April 21, 1956 DOA: 05/14/2015  Referring physician: EDP PCP: Ron Parker, MD  Specialists:   Chief Complaint: Seizure  HPI: Christine Pena is a 59 y.o. female with history of  Seizure Disorder, Secondary Parkinson's  Dz, and Mental Retardation who was fell out of her wheelchair at her SNF hitting her face and head  And had reported seizure like activity and was brought to the ED and evaluated, and was to return to the SNF but began to have witnessed generalized tonic clonic activity with left sided gaze lasting about 90 seconds.   She was post-ictal afterward, and Neurology was consulted and she was loaded with IV Keppra at 1000 mg and referred for admission.     Review of Systems: Unable to Obtain from the Patient   Past Medical History  Diagnosis Date  . Hypertension   . Seizures   . Mental retardation   . Hyperlipidemia   . Anxiety   . Vitamin D deficiency   . Obesity   . Dementia   . 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 No pertinent past surgical history.    Prior to Admission medications   Medication Sig Start Date End Date Taking? Authorizing Provider  acetaminophen (TYLENOL) 500 MG tablet Take 500 mg by mouth every 6 (six) hours as needed.    Historical Provider, MD  alum & mag hydroxide-simeth (MAALOX/MYLANTA) 200-200-20 MG/5ML suspension Take 30 mLs by mouth every 6 (six) hours as needed for indigestion or heartburn.    Historical Provider, MD  benztropine (COGENTIN) 1 MG tablet Take 1 mg by mouth 2 (two) times daily.    Historical Provider, MD  chlorhexidine (PERIDEX) 0.12 % solution Use as directed 10 mLs in the mouth or throat 2 (two) times daily.     Historical Provider, MD  clonazePAM (KLONOPIN) 1 MG tablet Take 1 mg by mouth 2 (two) times daily.    Historical Provider, MD  furosemide (LASIX) 20 MG tablet Take 20 mg by mouth 2 (two) times daily.     Historical Provider, MD  guaifenesin (ROBITUSSIN) 100 MG/5ML syrup Take 200 mg by mouth 3 (three) times daily as needed for cough.    Historical Provider, MD  levETIRAcetam (KEPPRA) 1000 MG tablet Take 1 tablet (1,000 mg total) by mouth See admin instructions. Take 1.5 tablet in morning and 1 table in evening. 05/14/15   Ames Dura, MD  loperamide (IMODIUM) 2 MG capsule Take 2 mg by mouth as needed for diarrhea or loose stools.    Historical Provider, MD  magnesium hydroxide (MILK OF MAGNESIA) 400 MG/5ML suspension Take 30 mLs by mouth daily as needed for mild constipation.    Historical Provider, MD  miconazole (MICOTIN) 2 % cream Apply 1 application topically 2 (two) times daily.    Historical Provider, MD  moexipril (UNIVASC) 7.5 MG tablet Take 7.5 mg by mouth daily.  12/30/13   Historical Provider, MD  oxybutynin (DITROPAN-XL) 5 MG 24 hr tablet Take 5 mg by mouth daily.    Historical Provider, MD  PHENobarbital (LUMINAL) 64.8 MG tablet Take 2 tablets (129.6 mg total) by mouth at bedtime. Patient taking differently: Take 64.8 mg by mouth at bedtime. Patient takes 2 tablets Tuesday, Thursday, Saturday and Sunday. She takes 3 tablets Monday, Wednesday, Friday. 10/11/14   Gwenyth Bender, NP  pravastatin (PRAVACHOL) 40 MG tablet Take 40 mg by mouth daily.  Historical Provider, MD  QUEtiapine (SEROQUEL) 50 MG tablet Take 1 tablet (50 mg total) by mouth at bedtime. Patient taking differently: Take 100 mg by mouth 2 (two) times daily.  10/11/14   Gwenyth Bender, NP  simvastatin (ZOCOR) 20 MG tablet Take 20 mg by mouth at bedtime.  12/30/13   Historical Provider, MD  thiothixene (NAVANE) 10 MG capsule Take 10 mg by mouth 3 (three) times daily.    Historical Provider, MD  Vitamin D, Ergocalciferol, (DRISDOL) 50000 UNITS CAPS capsule Take 50,000 Units by mouth every 7 (seven) days. Takes on Saturdays.    Historical Provider, MD  vitamin E (VITAMIN E) 200 UNIT capsule Take 200 Units by mouth daily.     Historical Provider, MD     Allergies  Allergen Reactions  . Depakote [Valproic Acid]     altered mental status/ high ammonia  . Haldol [Haloperidol Lactate]     Unknown reaction.    Social History:  reports that she has never smoked. She has never used smokeless tobacco. She reports that she does not drink alcohol or use illicit drugs.    Family History  Problem Relation Age of Onset  . Heart disease Mother        Physical Exam:  GEN:  Obtunded Obese 59 y.o. Caucasian female examined and in no acute distress;  Filed Vitals:   05/14/15 2330 05/15/15 0015 05/15/15 0100 05/15/15 0152  BP: 149/71 144/67 144/68 152/67  Pulse: 77 66 64 81  Temp:    98.8 F (37.1 C)  TempSrc:    Oral  Resp: 25 20 17  32  SpO2: 95% 94% 92% 93%   Blood pressure 152/67, pulse 81, temperature 98.8 F (37.1 C), temperature source Oral, resp. rate 32, SpO2 93 %. PSYCH: She is alert and oriented x 0; Obtunded HEENT: Normocephalic and Atraumatic, Mucous membranes pink; PERRLA; EOM intact; Fundi:  Benign;  No scleral icterus, Nares: Patent, Oropharynx: Clear, Unable to Visualize,    Neck:  FROM, No Cervical Lymphadenopathy nor Thyromegaly or Carotid Bruit; No JVD; Breasts:: Not examined CHEST WALL: No tenderness CHEST: Normal respiration, clear to auscultation bilaterally HEART: Regular rate and rhythm; no murmurs rubs or gallops BACK: No kyphosis or scoliosis; No CVA tenderness ABDOMEN: Positive Bowel Sounds, Obese, Soft Non-Tender, No Rebound or Guarding; No Masses, No Organomegaly, No Pannus; No Intertriginous candida. Rectal Exam: Not done EXTREMITIES: No  Cyanosis, Clubbing, or Edema; No Ulcerations. Genitalia: not examined PULSES: 2+ and symmetric SKIN: Normal hydration no rash or ulceration CNS:  Alert and Oriented x 0, No Focal Deficits Vascular: pulses palpable throughout    Labs on Admission:  Basic Metabolic Panel:  Recent Labs Lab 05/14/15 2227  NA 144  K 3.5  CL 104  GLUCOSE  116*  BUN 11  CREATININE 0.60   Liver Function Tests: No results for input(s): AST, ALT, ALKPHOS, BILITOT, PROT, ALBUMIN in the last 168 hours. No results for input(s): LIPASE, AMYLASE in the last 168 hours. No results for input(s): AMMONIA in the last 168 hours. CBC:  Recent Labs Lab 05/14/15 2212 05/14/15 2227  WBC 8.1  --   NEUTROABS 6.2  --   HGB 12.9 12.9  HCT 37.2 38.0  MCV 86.9  --   PLT 285  --    Cardiac Enzymes: No results for input(s): CKTOTAL, CKMB, CKMBINDEX, TROPONINI in the last 168 hours.  BNP (last 3 results) No results for input(s): BNP in the last 8760 hours.  ProBNP (last 3 results)  No results for input(s): PROBNP in the last 8760 hours.  CBG:  Recent Labs Lab 05/14/15 2306  GLUCAP 134*    Radiological Exams on Admission: Ct Head Wo Contrast  05/14/2015   CLINICAL DATA:  Trauma, injury. Fall forward from wheelchair landing on face. Evaluate for intracranial bleed or cervical spine injury.  EXAM: CT HEAD WITHOUT CONTRAST  CT CERVICAL SPINE WITHOUT CONTRAST  TECHNIQUE: Multidetector CT imaging of the head and cervical spine was performed following the standard protocol without intravenous contrast. Multiplanar CT image reconstructions of the cervical spine were also generated.  COMPARISON:  Most recent head and cervical spine CT 01/28/2015  FINDINGS: CT HEAD FINDINGS  No intracranial hemorrhage, mass effect, or midline shift. No hydrocephalus. The basilar cisterns are patent. No evidence of territorial infarct. No intracranial fluid collection. Cerebellar atrophy is unchanged. Questionable right frontal scalp swelling. Calvarium is intact. Included paranasal sinuses and mastoid air cells are well aerated.  CT CERVICAL SPINE FINDINGS  Cervical spine alignment is maintained. Vertebral body heights preserved. There is no fracture. The dens is intact. There are no jumped or perched facets. Mild diffuse disc space narrowing and endplate spurring most prominent in  the lower cervical spine. Prominent Schmorl's node involving inferior endplate of T2. No prevertebral soft tissue edema.  IMPRESSION: 1. No acute intracranial abnormality. Cerebellar atrophy is unchanged. 2. No acute fracture or subluxation in the cervical spine.   Electronically Signed   By: Rubye Oaks M.D.   On: 05/14/2015 23:13   Ct Cervical Spine Wo Contrast  05/14/2015   CLINICAL DATA:  Trauma, injury. Fall forward from wheelchair landing on face. Evaluate for intracranial bleed or cervical spine injury.  EXAM: CT HEAD WITHOUT CONTRAST  CT CERVICAL SPINE WITHOUT CONTRAST  TECHNIQUE: Multidetector CT imaging of the head and cervical spine was performed following the standard protocol without intravenous contrast. Multiplanar CT image reconstructions of the cervical spine were also generated.  COMPARISON:  Most recent head and cervical spine CT 01/28/2015  FINDINGS: CT HEAD FINDINGS  No intracranial hemorrhage, mass effect, or midline shift. No hydrocephalus. The basilar cisterns are patent. No evidence of territorial infarct. No intracranial fluid collection. Cerebellar atrophy is unchanged. Questionable right frontal scalp swelling. Calvarium is intact. Included paranasal sinuses and mastoid air cells are well aerated.  CT CERVICAL SPINE FINDINGS  Cervical spine alignment is maintained. Vertebral body heights preserved. There is no fracture. The dens is intact. There are no jumped or perched facets. Mild diffuse disc space narrowing and endplate spurring most prominent in the lower cervical spine. Prominent Schmorl's node involving inferior endplate of T2. No prevertebral soft tissue edema.  IMPRESSION: 1. No acute intracranial abnormality. Cerebellar atrophy is unchanged. 2. No acute fracture or subluxation in the cervical spine.   Electronically Signed   By: Rubye Oaks M.D.   On: 05/14/2015 23:13   Dg Chest Portable 1 View  05/14/2015   CLINICAL DATA:  Fall with seizure.  Evaluate bleed.   EXAM: PORTABLE CHEST - 1 VIEW  COMPARISON:  Chest radiograph 02/15/2015  FINDINGS: Lung volumes remain low. Patient is rotated. Heart at the upper limits of normal in size. Mild vascular congestion without overt edema. Mild bibasilar atelectasis, left greater than right. No large pleural effusion. No evident pneumothorax. No acute osseous abnormalities are seen.  IMPRESSION: 1. No evidence of acute traumatic injury allowing for low lung volumes and rotation. 2. Mild vascular congestion and bibasilar atelectasis.   Electronically Signed   By: Shawna Orleans  Ehinger M.D.   On: 05/14/2015 22:41     EKG: Independently reviewed.    Assessment/Plan:  59 y.o. female with  Principal Problem:   1.    Seizure   Cardiac monitoring   Seizure Precautions   Neurologic Checks   IV Keppra   PRN IV Ativan   NPO while Obtunded   Neurology Following   Active Problems:   2.    Fall     3.    Essential hypertension   PRN IV Hydralazine for SBP > 160 while NPO     4.    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    Hyperlipidemia   Resume Simvastatin when taking PO      6.    DVT Prophylaxis   Lovenox     Code Status:     FULL CODE        Family Communication:  No Family Present    Disposition Plan:    Inpatient  Status        Time spent:  54 Minutes      Ron Parker [facility] Pager [phone]   If 7AM -7PM Please Contact the Day Rounding Team MD for [facility]  If 7PM-7AM, Please Contact Night-Floor Coverage  [email or web address] Password [facility] 05/15/2015, 2:33 AM     ADDENDUM:   Patient was seen and examined on 05/15/2015

## 2015-05-16 ENCOUNTER — Encounter (HOSPITAL_COMMUNITY): Payer: Self-pay | Admitting: General Practice

## 2015-05-16 DIAGNOSIS — R569 Unspecified convulsions: Secondary | ICD-10-CM

## 2015-05-16 MED ORDER — PHENOBARBITAL 32.4 MG PO TABS
129.6000 mg | ORAL_TABLET | Freq: Once | ORAL | Status: AC
  Administered 2015-05-16: 129.6 mg via ORAL
  Filled 2015-05-16: qty 4

## 2015-05-16 NOTE — Clinical Social Work Note (Signed)
CSW  Informed by Babette Relic that [facility] can not accept the pt back. [name] reported that they can not provided the appopriate care for the pt. CSW contacted CSW Chiropodist to informed the CSW Chiropodist regarding this barrier.   Aren Cherne, MSW, LCSWA [phone]

## 2015-05-16 NOTE — Progress Notes (Signed)
Subjective: Sitting up in bed, avoids eye contact, mumbling. No seizure activity noted  Exam: Filed Vitals:   05/16/15 0533  BP: 129/68  Pulse: 70  Temp: 97.3 F (36.3 C)  Resp: 18    HEENT-  Normocephalic, no lesions, without obvious abnormality.  Normal external eye and conjunctiva.  Normal TM's bilaterally.  Normal auditory canals and external ears. Normal external nose, mucus membranes and septum.  Normal pharynx. Cardiovascular- S1, S2 normal, pulses palpable throughout   Lungs- chest clear, no wheezing, rales, normal symmetric air entry, Heart exam - S1, S2 normal, no murmur, no gallop, rate regular Abdomen- normal findings: bowel sounds normal Extremities- no skin discoloration    Gen: In bed, NAD MS: sitting up in bed, tells me that she is watching "TV" and that her hand hurts CN: PERRLA, EOMI, face symmetric, blinks to threat bialterally Motor: moving all extremities antigravity.  Sensory: withdraws from pain in all extremities   Pertinent Labs: none  Felicie Morn PA-C [facility] [phone]  Impression: 59 yo F with MR and breakthrough seizures. Continue with increased dose of keppra. Could give small extra dose of phenobarbital. She has been consistently theraputic in the past,    Recommendations:  1) Pharmacy is placing order for one time dose of 129.6 phenobarbitol 2) continue with previous home dosing of phb 3) Follow up level in one week, if still subtheraputic, then would increase dose.  4) Continue increased dose of keppra.   Ritta Slot, MD [facility] [phone]  If 7pm- 7am, please page neurology on call as listed in AMION. 05/16/2015, 10:08 AM

## 2015-05-16 NOTE — Discharge Summary (Addendum)
Physician Discharge Summary  Christine Pena:[id number] DOB: [birth year] DOA: 05/14/2015  PCP: Ron Parker, MD  Admit date: 05/14/2015 Discharge date: 05/16/2015 discharged 05/16/15, left 05/17/15  Time spent: greater than 30 minutes  Recommendations for Outpatient Follow-up:  1. Check phenobarbital level in 1 week. If still subtherapeutic, increased phenobarbital dose. 2. Back to ALF  Discharge Diagnoses:  Principal Problem:   Seizure Active Problems:   Essential hypertension   Fall   Secondary Parkinson disease   Hyperlipidemia   Discharge Condition: Stable  Diet recommendation: Heart healthy  Filed Weights   05/15/15 1331 05/15/15 1505  Weight: 89.7 kg (197 lb 12 oz) 89.7 kg (197 lb 12 oz)   History of present illness:   59 y.o. female with history of Seizure Disorder, Secondary Parkinson's Dz, and Mental Retardation who was fell out of her wheelchair at her ALF hitting her face and head And had reported seizure like activity and was brought to the ED and evaluated, and was to return to the ALF but began to have witnessed generalized tonic clonic activity with left sided gaze lasting about 90 seconds. She was post-ictal afterward, and Neurology was consulted and she was loaded with IV Keppra at 1000 mg and referred for admission.  Hospital Course:  Admitted to hospitalist service. Neurology consulted. Keppra dose increased after loading dose. She should take 1500 mg in the morning. 1000 mg in the evening. Her phenobarbital level was noted to be low. She was given a loading dose of 120 mg. Neurology recommends continuing current phenobarbital dose, rechecking level in a week and if still subtherapeutic, adjusting upward. She is followed by Dr. Anne Hahn with [facility] and may follow-up with him. Any questions about anti-epileptic drugs may be posed to him.  Patient has had no further seizures and remained stable. Will go back to facility. EEG showed no  continuing seizure activity.  Procedures:  none  Consultations:  neurology  Discharge Exam: Filed Vitals:   05/16/15 1053  BP: 120/59  Pulse: 70  Temp: 97.2 F (36.2 C)  Resp:     General: Alert. Answers questions and follows commands. Appears comfortable. Cardiovascular: Regular rate rhythm without murmurs gallops rubs Respiratory: Clear to auscultation bilaterally without wheezes rhonchi or rales Neurologic: Nonfocal Psychiatric: Flat affect. Calm and cooperative.  Discharge Instructions   Discharge Instructions    Diet - low sodium heart healthy    Complete by:  As directed      Walk with assistance    Complete by:  As directed           Current Discharge Medication List    CONTINUE these medications which have CHANGED   Details  levETIRAcetam (KEPPRA) 1000 MG tablet Take 1 tablet (1,000 mg total) by mouth See admin instructions. Take 1.5 tablet in morning and 1 table in evening. Qty: 60 tablet, Refills: 1      CONTINUE these medications which have NOT CHANGED   Details  acetaminophen (TYLENOL) 500 MG tablet Take 500 mg by mouth every 4 (four) hours as needed for mild pain or fever.     alum & mag hydroxide-simeth (MAALOX/MYLANTA) 200-200-20 MG/5ML suspension Take 30 mLs by mouth every 6 (six) hours as needed for indigestion or heartburn.    benztropine (COGENTIN) 1 MG tablet Take 1 mg by mouth 2 (two) times daily.    chlorhexidine (PERIDEX) 0.12 % solution Use as directed 10 mLs in the mouth or throat 4 (four) times daily.     clonazePAM (KLONOPIN)  1 MG tablet Take 1 mg by mouth 2 (two) times daily.    furosemide (LASIX) 20 MG tablet Take 20 mg by mouth 2 (two) times daily.    guaifenesin (ROBITUSSIN) 100 MG/5ML syrup Take 200 mg by mouth every 6 (six) hours as needed for cough.     hydrocortisone (ANUSOL-HC) 2.5 % rectal cream Place 1 application rectally 2 (two) times daily.    loperamide (IMODIUM) 2 MG capsule Take 2 mg by mouth every 3 (three)  hours as needed for diarrhea or loose stools.     magnesium hydroxide (MILK OF MAGNESIA) 400 MG/5ML suspension Take 30 mLs by mouth at bedtime as needed for mild constipation.     moexipril (UNIVASC) 7.5 MG tablet Take 7.5 mg by mouth daily.     oxybutynin (DITROPAN-XL) 5 MG 24 hr tablet Take 5 mg by mouth daily.    PHENobarbital (LUMINAL) 64.8 MG tablet Take 2 tablets (129.6 mg total) by mouth at bedtime. Qty: 30 tablet, Refills: 0    QUEtiapine (SEROQUEL) 50 MG tablet Take 1 tablet (50 mg total) by mouth at bedtime. Qty: 30 tablet, Refills: 1    simvastatin (ZOCOR) 20 MG tablet Take 20 mg by mouth at bedtime.     sodium chloride (OCEAN) 0.65 % SOLN nasal spray Place 2 sprays into both nostrils every 4 (four) hours as needed for congestion.    thiothixene (NAVANE) 10 MG capsule Take 10 mg by mouth 3 (three) times daily.    Vitamin D, Ergocalciferol, (DRISDOL) 50000 UNITS CAPS capsule Take 50,000 Units by mouth every 7 (seven) days. Takes on Saturdays.    vitamin E (VITAMIN E) 200 UNIT capsule Take 200 Units by mouth daily.       Allergies  Allergen Reactions  . Depakote [Valproic Acid]     altered mental status/ high ammonia  . Haldol [Haloperidol Lactate]     Unknown reaction.   Follow-up Information    Follow up with Ron Parker, MD.   Specialty:  Internal Medicine       The results of significant diagnostics from this hospitalization (including imaging, microbiology, ancillary and laboratory) are listed below for reference.    Significant Diagnostic Studies: Ct Head Wo Contrast  05/14/2015   CLINICAL DATA:  Trauma, injury. Fall forward from wheelchair landing on face. Evaluate for intracranial bleed or cervical spine injury.  EXAM: CT HEAD WITHOUT CONTRAST  CT CERVICAL SPINE WITHOUT CONTRAST  TECHNIQUE: Multidetector CT imaging of the head and cervical spine was performed following the standard protocol without intravenous contrast. Multiplanar CT image  reconstructions of the cervical spine were also generated.  COMPARISON:  Most recent head and cervical spine CT 01/28/2015  FINDINGS: CT HEAD FINDINGS  No intracranial hemorrhage, mass effect, or midline shift. No hydrocephalus. The basilar cisterns are patent. No evidence of territorial infarct. No intracranial fluid collection. Cerebellar atrophy is unchanged. Questionable right frontal scalp swelling. Calvarium is intact. Included paranasal sinuses and mastoid air cells are well aerated.  CT CERVICAL SPINE FINDINGS  Cervical spine alignment is maintained. Vertebral body heights preserved. There is no fracture. The dens is intact. There are no jumped or perched facets. Mild diffuse disc space narrowing and endplate spurring most prominent in the lower cervical spine. Prominent Schmorl's node involving inferior endplate of T2. No prevertebral soft tissue edema.  IMPRESSION: 1. No acute intracranial abnormality. Cerebellar atrophy is unchanged. 2. No acute fracture or subluxation in the cervical spine.   Electronically Signed   By: Rubye Oaks  M.D.   On: 05/14/2015 23:13   Ct Cervical Spine Wo Contrast  05/14/2015   CLINICAL DATA:  Trauma, injury. Fall forward from wheelchair landing on face. Evaluate for intracranial bleed or cervical spine injury.  EXAM: CT HEAD WITHOUT CONTRAST  CT CERVICAL SPINE WITHOUT CONTRAST  TECHNIQUE: Multidetector CT imaging of the head and cervical spine was performed following the standard protocol without intravenous contrast. Multiplanar CT image reconstructions of the cervical spine were also generated.  COMPARISON:  Most recent head and cervical spine CT 01/28/2015  FINDINGS: CT HEAD FINDINGS  No intracranial hemorrhage, mass effect, or midline shift. No hydrocephalus. The basilar cisterns are patent. No evidence of territorial infarct. No intracranial fluid collection. Cerebellar atrophy is unchanged. Questionable right frontal scalp swelling. Calvarium is intact. Included  paranasal sinuses and mastoid air cells are well aerated.  CT CERVICAL SPINE FINDINGS  Cervical spine alignment is maintained. Vertebral body heights preserved. There is no fracture. The dens is intact. There are no jumped or perched facets. Mild diffuse disc space narrowing and endplate spurring most prominent in the lower cervical spine. Prominent Schmorl's node involving inferior endplate of T2. No prevertebral soft tissue edema.  IMPRESSION: 1. No acute intracranial abnormality. Cerebellar atrophy is unchanged. 2. No acute fracture or subluxation in the cervical spine.   Electronically Signed   By: Rubye Oaks M.D.   On: 05/14/2015 23:13   Dg Chest Portable 1 View  05/14/2015   CLINICAL DATA:  Fall with seizure.  Evaluate bleed.  EXAM: PORTABLE CHEST - 1 VIEW  COMPARISON:  Chest radiograph 02/15/2015  FINDINGS: Lung volumes remain low. Patient is rotated. Heart at the upper limits of normal in size. Mild vascular congestion without overt edema. Mild bibasilar atelectasis, left greater than right. No large pleural effusion. No evident pneumothorax. No acute osseous abnormalities are seen.  IMPRESSION: 1. No evidence of acute traumatic injury allowing for low lung volumes and rotation. 2. Mild vascular congestion and bibasilar atelectasis.   Electronically Signed   By: Rubye Oaks M.D.   On: 05/14/2015 22:41   EEG mild generalized nonspecific continuous slowing of cerebral activity. No evidence of seizure activity was recorded  Microbiology: Recent Results (from the past 240 hour(s))  MRSA PCR Screening     Status: Abnormal   Collection Time: 05/15/15 11:40 AM  Result Value Ref Range Status   MRSA by PCR POSITIVE (A) NEGATIVE Final    Comment:        The GeneXpert MRSA Assay (FDA approved for NASAL specimens only), is one component of a comprehensive MRSA colonization surveillance program. It is not intended to diagnose MRSA infection nor to guide or monitor treatment for MRSA  infections. RESULT CALLED TO, READ BACK BY AND VERIFIED WITH: Newt Minion AT 1402 05/15/15 BY L BENFIELD      Labs: Basic Metabolic Panel:  Recent Labs Lab 05/14/15 2227 05/15/15 0911  NA 144 144  K 3.5 3.3*  CL 104 108  CO2  --  26  GLUCOSE 116* 104*  BUN 11 8  CREATININE 0.60 0.51  CALCIUM  --  9.4   Liver Function Tests: No results for input(s): AST, ALT, ALKPHOS, BILITOT, PROT, ALBUMIN in the last 168 hours. No results for input(s): LIPASE, AMYLASE in the last 168 hours. No results for input(s): AMMONIA in the last 168 hours. CBC:  Recent Labs Lab 05/14/15 2212 05/14/15 2227 05/15/15 0911  WBC 8.1  --  6.9  NEUTROABS 6.2  --   --  HGB 12.9 12.9 11.7*  HCT 37.2 38.0 34.3*  MCV 86.9  --  86.8  PLT 285  --  251   Cardiac Enzymes: No results for input(s): CKTOTAL, CKMB, CKMBINDEX, TROPONINI in the last 168 hours. BNP: BNP (last 3 results) No results for input(s): BNP in the last 8760 hours.  ProBNP (last 3 results) No results for input(s): PROBNP in the last 8760 hours.  CBG:  Recent Labs Lab 05/14/15 2306 05/15/15 1114  GLUCAP 134* 98       Signed:  Teressa Mcglocklin L  [facility] 05/16/2015, 11:30 AM

## 2015-05-16 NOTE — Progress Notes (Signed)
Received report from SW re: tx to facility, unaviodable delay til am.

## 2015-05-16 NOTE — Clinical Social Work Note (Signed)
Clinical Social Work Assessment  Patient Details  Name: Christine Pena MRN: [id number] Date of Birth: 1956/01/23  Date of referral:  05/16/15               Reason for consult:  Facility Placement                Housing/Transportation Living arrangements for the past 2 months:  Assisted Living Facility [facility] ) Source of Information:   (Sibling: Christine Pena) Patient Interpreter Needed:  None Criminal Activity/Legal Involvement Pertinent to Current Situation/Hospitalization:  No - Comment as needed Significant Relationships:  Siblings Lives with:  Facility Resident Do you feel safe going back to the place where you live?  Yes Need for family participation in patient care:  Yes (Comment)  Care giving concerns:  None   Social Worker assessment / plan: CSW spoke with the pt's brother Christine Pena. Christine Pena confirmed that the pt is a resident at [facility]. Christine Pena reported that he would like the pt to return back to [facility]. CSW faxed the pt's clinicals to Christine Pena.    Employment status:  Disabled (Comment on whether or not currently receiving Disability) Insurance information:  Managed Medicare PT Recommendations:  Skilled Nursing Facility Information / Referral to community resources:  Skilled Nursing Facility  Patient/Family's Response to care:  Christine Pena reported not knowing that the pt was in the hospital.   Patient/Family's Understanding of and Emotional Response to Diagnosis, Current Treatment, and Prognosis: Christine Pena requesting to speak with the MD.    Emotional Assessment Appearance:  Appears stated age Attitude/Demeanor/Rapport:  Unable to Assess Affect (typically observed):  Unable to Assess Orientation:  Oriented to Self Alcohol / Substance use:  Not Applicable Psych involvement (Current and /or in the community):  No (Comment)  Discharge Needs  Concerns to be addressed:  Denies Needs/Concerns at this time Readmission within the last 30 days:  No Current  discharge risk:  None Barriers to Discharge:  No Barriers Identified   Christine Geerdes, LCSW 05/16/2015, 3:21 PM

## 2015-05-16 NOTE — Progress Notes (Addendum)
Per Physical Therapy, Christine Pena is apparently at her baseline. This limits the skilled nursing facility possibilities under the patient's Advantage Plan .  CSW Chiropodist spoke with Christine Pena from [facility] regarding Christine Pena's needs. The Assisted Living Facility has agreed to accept the patient back this afternoon, will make accommodations for her care. Chiropodist notified evening CSW to proceed with discharge planning.  Christine Pena, Marine scientist Clinical Social Work [employer]

## 2015-05-16 NOTE — Evaluation (Signed)
Physical Therapy Evaluation Patient Details Name: AERYN MEDICI MRN: [id number] DOB: August 17, 1956 Today's Date: 05/16/2015   History of Present Illness  SAAYA PROCELL is a 59 y.o. female with history of Seizure Disorder, Secondary Parkinson's Dz, and Mental Retardation who was fell out of her wheelchair at her SNF hitting her face and head And had reported seizure like activity and was brought to the ED and evaluated, and was to return to the SNF but began to have witnessed generalized tonic clonic activity with left sided gaze lasting about 90 seconds.  Clinical Impression  Patient presents as total assist for all mobility which apparently is her baseline.  Feel patient may benefit from f/u PT at SNF.      Follow Up Recommendations SNF    Equipment Recommendations  None recommended by PT    Recommendations for Other Services       Precautions / Restrictions Precautions Precautions: Fall Restrictions Weight Bearing Restrictions: No      Mobility  Bed Mobility Overal bed mobility: Needs Assistance Bed Mobility: Rolling Rolling: Total assist      General bed mobility comments: Patient required increased encouragement to initiate rolling, once she did, she was able to initiate and required assistance to complete roll  Transfers                   Ambulation/Gait                Stairs            Wheelchair Mobility    Modified Rankin (Stroke Patients Only)       Balance                                     Pertinent Vitals/Pain Pain Assessment: Faces Faces Pain Scale: No hurt    Home Living                   Additional Comments: Pt from [facility]    Prior Function Level of Independence: Needs assistance   Gait / Transfers Assistance Needed: completely dependent per staff at [facility]: per staff at [facility] pt was total care except for self feeding. They had to  physically lift her OOB into a W/C due to they do not have a hoyer lift there. The person I spoke to said as of the last time the pt's mother visited (1-2 months ago) pt would do anything her mom told her to do (including geting up and walking without an AD. They also report that [facility] (HHPT) had D/C'd pt due to pt would not do anything with them.     Hand Dominance        Extremity/Trunk Assessment   Upper Extremity Assessment: Generalized weakness           Lower Extremity Assessment: Generalized weakness         Communication     Cognition Arousal/Alertness: Awake/alert Behavior During Therapy: Flat affect Overall Cognitive Status: History of cognitive impairments - at baseline                      General Comments      Exercises        Assessment/Plan    PT Assessment All further PT needs can be met in the next venue of care  PT Diagnosis  Generalized weakness   PT Problem List Decreased activity tolerance;Decreased mobility  PT Treatment Interventions     PT Goals (Current goals can be found in the Care Plan section) Acute Rehab PT Goals Patient Stated Goal: none stated PT Goal Formulation: All assessment and education complete, DC therapy    Frequency     Barriers to discharge        Co-evaluation               End of Session   Activity Tolerance: No increased pain;Patient tolerated treatment well   Nurse Communication: Mobility status         Time: 1031-2811 PT Time Calculation (min) (ACUTE ONLY): 12 min   Charges:   PT Evaluation $Initial PT Evaluation Tier I: 1 Procedure     PT G CodesShanna Cisco 05/16/2015, 3:27 PM  05/16/2015 [place], [place]

## 2015-05-16 NOTE — Evaluation (Signed)
Occupational Therapy Evaluation and Discharge Patient Details Name: Christine Pena MRN: [id number] DOB: May 04, 1956 Today's Date: 05/16/2015    History of Present Illness Christine Pena is a 59 y.o. female with history of Seizure Disorder, Secondary Parkinson's Dz, and Mental Retardation who was fell out of her wheelchair at her SNF hitting her face and head And had reported seizure like activity and was brought to the ED and evaluated, and was to return to the SNF but began to have witnessed generalized tonic clonic activity with left sided gaze lasting about 90 seconds.   Clinical Impression   This 59 yo female admitted with above presents to acute OT at what appears to be baseline thus not skilled needs identified, we will sign off.    Follow Up Recommendations  No OT follow up    Equipment Recommendations   (hoyer lift at facility she D/C's to)       Precautions / Restrictions Precautions Precautions: Fall Restrictions Weight Bearing Restrictions: No      Mobility Bed Mobility Overal bed mobility: Needs Assistance Bed Mobility: Supine to Sit;Sit to Supine     Supine to sit: Total assist Sit to supine: Total assist   General bed mobility comments: Pt inititated movement to start, but then did not attempt to A past this  Transfers                 General transfer comment: Pt would not attempt with me    Balance Overall balance assessment: Needs assistance Sitting-balance support: Feet supported;Bilateral upper extremity supported Sitting balance-Leahy Scale: Poor Sitting balance - Comments: tendency to posterior lean--VCs and gestural cues to lean forward                                    ADL                                         General ADL Comments: total A for all  ABDLs. She did not attempt to wash her face when I put a washcloth in her hand and asked her to wash her face. When I put a comb in her hand she did  bring it up to her head and combed it once.      Vision Additional Comments: Unsure of baseline and pt unable to tell me          Pertinent Vitals/Pain Pain Assessment: Faces Faces Pain Scale: No hurt        Extremity/Trunk Assessment Upper Extremity Assessment Upper Extremity Assessment: Generalized weakness   Lower Extremity Assessment Lower Extremity Assessment: Defer to PT evaluation       Communication Communication Communication:  (pt spoke very minimally to me even when given yes/no questions. The only time she intitated conversation was to tell me "no" to standing up to get in recliner)   Cognition Arousal/Alertness: Awake/alert Behavior During Therapy: Flat affect Overall Cognitive Status: History of cognitive impairments - at baseline                                Home Living  Additional Comments: Pt from [facility]      Prior Functioning/Environment Level of Independence: Needs assistance        Comments: per staff at [facility] pt was total care except for self feeding. They had to physically lift her OOB into a W/C due to they do not have a hoyer lift there. The person I spoke to said as of the last time the pt's mother visited (1-2 months ago) pt would do anything her mom told her to do (including geting up and walking without an AD. They also report that [facility] (HHPT) had D/C'd pt due to pt would not do anything with them.    OT Diagnosis: Generalized weakness;Cognitive deficits   OT Problem List: Decreased strength;Decreased range of motion;Decreased activity tolerance;Impaired balance (sitting and/or standing);Decreased cognition;Obesity (all premorbid)                       End of Session    Activity Tolerance:  (limited to pt not wanting to try and stand up with me or get up in recliner once at EOB) Patient left: in bed;with call bell/phone within  reach;with bed alarm set   Time: 1345-1407 OT Time Calculation (min): 22 min Charges:  OT General Charges $OT Visit: 1 Procedure OT Evaluation $Initial OT Evaluation Tier I: 1 Procedure  Evette Georges [phone] 05/16/2015, 2:42 PM

## 2015-05-16 NOTE — Progress Notes (Signed)
Utilization review completed. Arleigh Odowd, RN, BSN. 

## 2015-05-16 NOTE — Progress Notes (Signed)
CSW attempted to generate updated FL2 this pm for pt's return to ALF, however computer issues prohibited the completion of document.  Facility aware, as is CSW AD, Wandra Mannan.  Unit CSW will be updated for f/u in the am.

## 2015-05-17 DIAGNOSIS — G2119 Other drug induced secondary parkinsonism: Secondary | ICD-10-CM | POA: Diagnosis not present

## 2015-05-17 DIAGNOSIS — R296 Repeated falls: Secondary | ICD-10-CM | POA: Diagnosis not present

## 2015-05-17 DIAGNOSIS — G218 Other secondary parkinsonism: Secondary | ICD-10-CM | POA: Diagnosis not present

## 2015-05-17 DIAGNOSIS — G219 Secondary parkinsonism, unspecified: Secondary | ICD-10-CM | POA: Diagnosis not present

## 2015-05-17 DIAGNOSIS — G40909 Epilepsy, unspecified, not intractable, without status epilepticus: Secondary | ICD-10-CM | POA: Diagnosis not present

## 2015-05-17 DIAGNOSIS — F79 Unspecified intellectual disabilities: Secondary | ICD-10-CM | POA: Diagnosis not present

## 2015-05-17 DIAGNOSIS — R569 Unspecified convulsions: Secondary | ICD-10-CM | POA: Diagnosis not present

## 2015-05-17 DIAGNOSIS — I10 Essential (primary) hypertension: Secondary | ICD-10-CM | POA: Diagnosis not present

## 2015-05-17 DIAGNOSIS — G4089 Other seizures: Secondary | ICD-10-CM | POA: Diagnosis not present

## 2015-05-17 DIAGNOSIS — E785 Hyperlipidemia, unspecified: Secondary | ICD-10-CM | POA: Diagnosis not present

## 2015-05-17 DIAGNOSIS — R32 Unspecified urinary incontinence: Secondary | ICD-10-CM | POA: Diagnosis not present

## 2015-05-17 NOTE — Clinical Social Work Note (Signed)
CSW informed by CSW AD Lubertha Basque that [facility] has agreed to take the pt back today. CSW updated the case manager and MD.   CSW informed the pt's brother Christine Pena and Kathlene November that the pt will be discharge back to [facility] today. CSW and the family discussed ambulance transport. CSW contact [facility] at [phone] to schedule transport for the pt. CSW informed Tori at [facility] regarding the pt return. CSW faxed the pt's discharge summary. Bedside RN provided the number to call report.   Treyven Lafauci, MSW, LCSWA [phone]

## 2015-05-17 NOTE — Progress Notes (Signed)
Discharge orders received.  VSS upon discharge.  IV removed and education complete.  Transported via [facility].  Ketan Renz M, RN    

## 2015-05-18 ENCOUNTER — Non-Acute Institutional Stay (SKILLED_NURSING_FACILITY): Payer: Medicare Other | Admitting: Internal Medicine

## 2015-05-18 DIAGNOSIS — R569 Unspecified convulsions: Secondary | ICD-10-CM | POA: Diagnosis not present

## 2015-05-18 DIAGNOSIS — R296 Repeated falls: Secondary | ICD-10-CM | POA: Diagnosis not present

## 2015-05-18 DIAGNOSIS — E785 Hyperlipidemia, unspecified: Secondary | ICD-10-CM

## 2015-05-18 DIAGNOSIS — I10 Essential (primary) hypertension: Secondary | ICD-10-CM | POA: Diagnosis not present

## 2015-05-18 DIAGNOSIS — F79 Unspecified intellectual disabilities: Secondary | ICD-10-CM | POA: Diagnosis not present

## 2015-05-18 DIAGNOSIS — G219 Secondary parkinsonism, unspecified: Secondary | ICD-10-CM | POA: Diagnosis not present

## 2015-05-19 ENCOUNTER — Other Ambulatory Visit: Payer: Self-pay

## 2015-05-19 MED ORDER — PHENOBARBITAL 64.8 MG PO TABS: 129.6000 mg | ORAL_TABLET | Freq: Every day | ORAL | Status: DC

## 2015-05-19 NOTE — Telephone Encounter (Signed)
RX faxed to [facility] @ [phone], phone number [phone] 

## 2015-05-25 ENCOUNTER — Encounter: Payer: Self-pay | Admitting: Internal Medicine

## 2015-05-25 NOTE — Progress Notes (Signed)
Patient ID: Christine Pena, female   DOB: Mar 03, 1956, 59 y.o.   MRN: [id number]    HISTORY AND PHYSICAL   DATE: 05/18/15  Location:  [facility]    Place of Service: SNF [phone])   Extended Emergency Contact Information Primary Emergency Contact: Heitz,[name] Cleotilde Neer [place] Phone: [phone] Mobile Phone: [phone] Relation: Brother Secondary Emergency Contact: [place] of [place] Phone: [phone] Relation: Brother  Advanced Directive information  FULL CODE  Chief Complaint  Patient presents with  . New Admit To SNF    HPI:  59 yo female seen today as a new admission into SNF following hospital stay for seizures, fall, secondary parkinson's, HTN and hyperlipidemia. She has intellectual disability and is a poor historian. Hx obtained from chart. Imaging revealed no acute findings. EEG was neg for sz but showed nonspecific continuous slowing of cerebral activity.phenobarb level noted to be low. Her keppra dose was increased. She will need short term rehab then d/c to ALF  She has no c/o today. No seizures. No nursing issues. She is eating/sleeping well.  Urinary frequency - stable on ditropan xl.  Mood d/o stable on cogentin, clonopin, seroquel and thiothixene  BP controlled on lasix and lisinopril. She takes statin for cholesterol  She takes vitamins/mineral supplements   Past Medical History  Diagnosis Date  . Hypertension   . Seizures   . Mental retardation   . Hyperlipidemia   . Anxiety   . Vitamin D deficiency   . Obesity   . Dementia   . 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  Patient Care Team: Sande Brothers, MD as PCP - General (Internal Medicine)  Social History   Social History  . Marital Status: Single    Spouse Name: N/A  . Number of Children: 1  . Years of Education: N/A   Occupational History  . Not on file.   Social History Main  Topics  . Smoking status: Never Smoker   . Smokeless tobacco: Never Used  . Alcohol Use: No  . Drug Use: No  . Sexual Activity: Not Currently    Birth Control/ Protection: Post-menopausal   Other Topics Concern  . Not on file   Social History Narrative   Patient drinks about 5 glasses of tea daily.     reports that she has never smoked. She has never used smokeless tobacco. She reports that she does not drink alcohol or use illicit drugs.  Family History  Problem Relation Age of Onset  . Heart disease Mother    Family Status  Relation Status Death Age  . Mother Alive   . Father Deceased   . Brother Alive   . Brother Alive      There is no immunization history on file for this patient.  Allergies  Allergen Reactions  . Depakote [Valproic Acid]     altered mental status/ high ammonia  . Haldol [Haloperidol Lactate]     Unknown reaction.    Medications: Patient's Medications  New Prescriptions   No medications on file  Previous Medications   ACETAMINOPHEN (TYLENOL) 500 MG TABLET    Take 500 mg by mouth every 4 (four) hours as needed for mild pain or fever.    ALUM & MAG HYDROXIDE-SIMETH (MAALOX/MYLANTA) 200-200-20 MG/5ML SUSPENSION    Take 30 mLs by mouth every 6 (six) hours as needed for indigestion or heartburn.   ATORVASTATIN (LIPITOR) 10 MG  TABLET    Take 10 mg by mouth daily.   BENZTROPINE (COGENTIN) 1 MG TABLET    Take 1 mg by mouth 2 (two) times daily.   CHLORHEXIDINE (PERIDEX) 0.12 % SOLUTION    Use as directed 10 mLs in the mouth or throat 4 (four) times daily.    CLONAZEPAM (KLONOPIN) 1 MG TABLET    Take 1 mg by mouth 2 (two) times daily.   FUROSEMIDE (LASIX) 20 MG TABLET    Take 20 mg by mouth 2 (two) times daily.   GUAIFENESIN (ROBITUSSIN) 100 MG/5ML SYRUP    Take 200 mg by mouth every 6 (six) hours as needed for cough.    HYDROCORTISONE (ANUSOL-HC) 2.5 % RECTAL CREAM    Place 1 application rectally 2 (two) times daily.   LEVETIRACETAM (KEPPRA) 1000 MG  TABLET    Take 1 tablet (1,000 mg total) by mouth See admin instructions. Take 1.5 tablet in morning and 1 table in evening.   LISINOPRIL (PRINIVIL,ZESTRIL) 10 MG TABLET    Take 10 mg by mouth daily.   LOPERAMIDE (IMODIUM) 2 MG CAPSULE    Take 2 mg by mouth every 3 (three) hours as needed for diarrhea or loose stools.    MAGNESIUM HYDROXIDE (MILK OF MAGNESIA) 400 MG/5ML SUSPENSION    Take 30 mLs by mouth at bedtime as needed for mild constipation.    MOEXIPRIL (UNIVASC) 7.5 MG TABLET    Take 7.5 mg by mouth daily.    OXYBUTYNIN (DITROPAN-XL) 5 MG 24 HR TABLET    Take 5 mg by mouth daily.   PHENOBARBITAL (LUMINAL) 64.8 MG TABLET    Take 2 tablets (129.6 mg total) by mouth at bedtime.   QUETIAPINE (SEROQUEL) 50 MG TABLET    Take 1 tablet (50 mg total) by mouth at bedtime.   SIMVASTATIN (ZOCOR) 20 MG TABLET    Take 20 mg by mouth at bedtime.    SODIUM CHLORIDE (OCEAN) 0.65 % SOLN NASAL SPRAY    Place 2 sprays into both nostrils every 4 (four) hours as needed for congestion.   THIOTHIXENE (NAVANE) 10 MG CAPSULE    Take 10 mg by mouth 3 (three) times daily.   VITAMIN D, ERGOCALCIFEROL, (DRISDOL) 50000 UNITS CAPS CAPSULE    Take 50,000 Units by mouth every 7 (seven) days. Takes on Saturdays.   VITAMIN E (VITAMIN E) 200 UNIT CAPSULE    Take 200 Units by mouth daily.  Modified Medications   No medications on file  Discontinued Medications   No medications on file    Review of Systems  Unable to perform ROS: Psychiatric disorder    Filed Vitals:   05/18/15 1535  BP: 127/84  Pulse: 67  Temp: 97.5 F (36.4 C)  Weight: 204 lb (92.534 kg)  SpO2: 93%   Body mass index is 35 kg/(m^2).  Physical Exam  Constitutional: She appears well-developed and well-nourished. No distress.  HENT:  Mouth/Throat: Oropharynx is clear and moist. No oropharyngeal exudate.  Eyes: Pupils are equal, round, and reactive to light. No scleral icterus.  Neck: Neck supple. Carotid bruit is not present. No tracheal  deviation present. No thyromegaly present.  Cardiovascular: Normal rate, regular rhythm, normal heart sounds and intact distal pulses.  Exam reveals no gallop and no friction rub.   No murmur heard. Trace LE edema b/l. No calf TTP  Pulmonary/Chest: Effort normal and breath sounds normal. No stridor. No respiratory distress. She has no wheezes. She has no rales.  Abdominal: Soft. Bowel  sounds are normal. She exhibits no distension and no mass. There is no hepatomegaly. There is no tenderness. There is no rebound and no guarding.  Lymphadenopathy:    She has no cervical adenopathy.  Neurological: She is alert.  Skin: Skin is warm and dry. No rash noted.  Psychiatric: She has a normal mood and affect. Her behavior is normal.     Labs reviewed: Admission on 05/14/2015, Discharged on 05/17/2015  Component Date Value Ref Range Status  . Glucose-Capillary 05/14/2015 134* 65 - 99 mg/dL Final  . WBC 05/14/2015 8.1  4.0 - 10.5 K/uL Final  . RBC 05/14/2015 4.28  3.87 - 5.11 MIL/uL Final  . Hemoglobin 05/14/2015 12.9  12.0 - 15.0 g/dL Final  . HCT 05/14/2015 37.2  36.0 - 46.0 % Final  . MCV 05/14/2015 86.9  78.0 - 100.0 fL Final  . MCH 05/14/2015 30.1  26.0 - 34.0 pg Final  . MCHC 05/14/2015 34.7  30.0 - 36.0 g/dL Final  . RDW 05/14/2015 12.3  11.5 - 15.5 % Final  . Platelets 05/14/2015 285  150 - 400 K/uL Final  . Neutrophils Relative % 05/14/2015 77  43 - 77 % Final  . Neutro Abs 05/14/2015 6.2  1.7 - 7.7 K/uL Final  . Lymphocytes Relative 05/14/2015 16  12 - 46 % Final  . Lymphs Abs 05/14/2015 1.3  0.7 - 4.0 K/uL Final  . Monocytes Relative 05/14/2015 7  3 - 12 % Final  . Monocytes Absolute 05/14/2015 0.6  0.1 - 1.0 K/uL Final  . Eosinophils Relative 05/14/2015 0  0 - 5 % Final  . Eosinophils Absolute 05/14/2015 0.0  0.0 - 0.7 K/uL Final  . Basophils Relative 05/14/2015 0  0 - 1 % Final  . Basophils Absolute 05/14/2015 0.0  0.0 - 0.1 K/uL Final  . Sodium 05/14/2015 144  135 - 145 mmol/L  Final  . Potassium 05/14/2015 3.5  3.5 - 5.1 mmol/L Final  . Chloride 05/14/2015 104  101 - 111 mmol/L Final  . BUN 05/14/2015 11  6 - 20 mg/dL Final  . Creatinine, Ser 05/14/2015 0.60  0.44 - 1.00 mg/dL Final  . Glucose, Bld 05/14/2015 116* 65 - 99 mg/dL Final  . Calcium, Ion 05/14/2015 1.24* 1.12 - 1.23 mmol/L Final  . TCO2 05/14/2015 28  0 - 100 mmol/L Final  . Hemoglobin 05/14/2015 12.9  12.0 - 15.0 g/dL Final  . HCT 05/14/2015 38.0  36.0 - 46.0 % Final  . Color, Urine 05/15/2015 AMBER* YELLOW Final   BIOCHEMICALS MAY BE AFFECTED BY COLOR  . APPearance 05/15/2015 TURBID* CLEAR Final  . Specific Gravity, Urine 05/15/2015 1.028  1.005 - 1.030 Final  . pH 05/15/2015 5.0  5.0 - 8.0 Final  . Glucose, UA 05/15/2015 100* NEGATIVE mg/dL Final  . Hgb urine dipstick 05/15/2015 MODERATE* NEGATIVE Final  . Bilirubin Urine 05/15/2015 SMALL* NEGATIVE Final  . Ketones, ur 05/15/2015 15* NEGATIVE mg/dL Final  . Protein, ur 05/15/2015 100* NEGATIVE mg/dL Final  . Urobilinogen, UA 05/15/2015 1.0  0.0 - 1.0 mg/dL Final  . Nitrite 05/15/2015 NEGATIVE  NEGATIVE Final  . Leukocytes, UA 05/15/2015 NEGATIVE  NEGATIVE Final  . Phenobarbital 05/15/2015 11.9* 15.0 - 40.0 ug/mL Final  . Squamous Epithelial / LPF 05/15/2015 RARE  RARE Final  . WBC, UA 05/15/2015 3-6  <3 WBC/hpf Final  . RBC / HPF 05/15/2015 0-2  <3 RBC/hpf Final  . Bacteria, UA 05/15/2015 RARE  RARE Final  . Casts 05/15/2015 GRANULAR CAST* NEGATIVE  Final  . Crystals 05/15/2015 CA OXALATE CRYSTALS* NEGATIVE Final  . Sodium 05/15/2015 144  135 - 145 mmol/L Final  . Potassium 05/15/2015 3.3* 3.5 - 5.1 mmol/L Final  . Chloride 05/15/2015 108  101 - 111 mmol/L Final  . CO2 05/15/2015 26  22 - 32 mmol/L Final  . Glucose, Bld 05/15/2015 104* 65 - 99 mg/dL Final  . BUN 05/15/2015 8  6 - 20 mg/dL Final  . Creatinine, Ser 05/15/2015 0.51  0.44 - 1.00 mg/dL Final  . Calcium 05/15/2015 9.4  8.9 - 10.3 mg/dL Final  . GFR calc non Af Amer 05/15/2015  >60  >60 mL/min Final  . GFR calc Af Amer 05/15/2015 >60  >60 mL/min Final   Comment: (NOTE) The eGFR has been calculated using the CKD EPI equation. This calculation has not been validated in all clinical situations. eGFR's persistently <60 mL/min signify possible Chronic Kidney Disease.   . Anion gap 05/15/2015 10  5 - 15 Final  . WBC 05/15/2015 6.9  4.0 - 10.5 K/uL Final  . RBC 05/15/2015 3.95  3.87 - 5.11 MIL/uL Final  . Hemoglobin 05/15/2015 11.7* 12.0 - 15.0 g/dL Final  . HCT 05/15/2015 34.3* 36.0 - 46.0 % Final  . MCV 05/15/2015 86.8  78.0 - 100.0 fL Final  . MCH 05/15/2015 29.6  26.0 - 34.0 pg Final  . MCHC 05/15/2015 34.1  30.0 - 36.0 g/dL Final  . RDW 05/15/2015 12.2  11.5 - 15.5 % Final  . Platelets 05/15/2015 251  150 - 400 K/uL Final  . Glucose-Capillary 05/15/2015 98  65 - 99 mg/dL Final  . MRSA by PCR 05/15/2015 POSITIVE* NEGATIVE Final   Comment:        The GeneXpert MRSA Assay (FDA approved for NASAL specimens only), is one component of a comprehensive MRSA colonization surveillance program. It is not intended to diagnose MRSA infection nor to guide or monitor treatment for MRSA infections. RESULT CALLED TO, READ BACK BY AND VERIFIED WITH: Leron Croak AT 1402 05/15/15 BY L BENFIELD   Admission on 03/09/2015, Discharged on 03/11/2015  Component Date Value Ref Range Status  . WBC 03/09/2015 8.5  4.0 - 10.5 K/uL Final  . RBC 03/09/2015 4.56  3.87 - 5.11 MIL/uL Final  . Hemoglobin 03/09/2015 13.4  12.0 - 15.0 g/dL Final  . HCT 03/09/2015 38.9  36.0 - 46.0 % Final  . MCV 03/09/2015 85.3  78.0 - 100.0 fL Final  . MCH 03/09/2015 29.4  26.0 - 34.0 pg Final  . MCHC 03/09/2015 34.4  30.0 - 36.0 g/dL Final  . RDW 03/09/2015 12.4  11.5 - 15.5 % Final  . Platelets 03/09/2015 SPECIMEN CHECKED FOR CLOTS  150 - 400 K/uL Final   Comment: PLATELET CLUMPS NOTED ON SMEAR, COUNT APPEARS ADEQUATE PLATELET CLUMPING, SUGGEST RECOLLECTION OF SAMPLE IN CITRATE TUBE.   Marland Kitchen  Neutrophils Relative % 03/09/2015 54  43 - 77 % Final  . Neutro Abs 03/09/2015 4.6  1.7 - 7.7 K/uL Final  . Lymphocytes Relative 03/09/2015 39  12 - 46 % Final  . Lymphs Abs 03/09/2015 3.3  0.7 - 4.0 K/uL Final  . Monocytes Relative 03/09/2015 7  3 - 12 % Final  . Monocytes Absolute 03/09/2015 0.6  0.1 - 1.0 K/uL Final  . Eosinophils Relative 03/09/2015 0  0 - 5 % Final  . Eosinophils Absolute 03/09/2015 0.0  0.0 - 0.7 K/uL Final  . Basophils Relative 03/09/2015 0  0 - 1 % Final  .  Basophils Absolute 03/09/2015 0.0  0.0 - 0.1 K/uL Final  . Sodium 03/09/2015 137  135 - 145 mmol/L Final  . Potassium 03/09/2015 4.0  3.5 - 5.1 mmol/L Final  . Chloride 03/09/2015 102  101 - 111 mmol/L Final  . CO2 03/09/2015 28  22 - 32 mmol/L Final  . Glucose, Bld 03/09/2015 103* 65 - 99 mg/dL Final  . BUN 03/09/2015 16  6 - 20 mg/dL Final  . Creatinine, Ser 03/09/2015 0.60  0.44 - 1.00 mg/dL Final  . Calcium 03/09/2015 9.5  8.9 - 10.3 mg/dL Final  . GFR calc non Af Amer 03/09/2015 >60  >60 mL/min Final  . GFR calc Af Amer 03/09/2015 >60  >60 mL/min Final   Comment: (NOTE) The eGFR has been calculated using the CKD EPI equation. This calculation has not been validated in all clinical situations. eGFR's persistently <60 mL/min signify possible Chronic Kidney Disease.   . Anion gap 03/09/2015 7  5 - 15 Final  . Alcohol, Ethyl (B) 03/09/2015 <5  <5 mg/dL Final   Comment:        LOWEST DETECTABLE LIMIT FOR SERUM ALCOHOL IS 5 mg/dL FOR MEDICAL PURPOSES ONLY   . Opiates 03/09/2015 NONE DETECTED  NONE DETECTED Final  . Cocaine 03/09/2015 NONE DETECTED  NONE DETECTED Final  . Benzodiazepines 03/09/2015 NONE DETECTED  NONE DETECTED Final  . Amphetamines 03/09/2015 NONE DETECTED  NONE DETECTED Final  . Tetrahydrocannabinol 03/09/2015 NONE DETECTED  NONE DETECTED Final  . Barbiturates 03/09/2015 POSITIVE* NONE DETECTED Final   Comment:        DRUG SCREEN FOR MEDICAL PURPOSES ONLY.  IF CONFIRMATION IS  NEEDED FOR ANY PURPOSE, NOTIFY LAB WITHIN 5 DAYS.        LOWEST DETECTABLE LIMITS FOR URINE DRUG SCREEN Drug Class       Cutoff (ng/mL) Amphetamine      1000 Barbiturate      200 Benzodiazepine   989 Tricyclics       211 Opiates          300 Cocaine          300 THC              50   Admission on 03/06/2015, Discharged on 03/06/2015  Component Date Value Ref Range Status  . WBC 03/06/2015 6.2  4.0 - 10.5 K/uL Final  . RBC 03/06/2015 4.35  3.87 - 5.11 MIL/uL Final  . Hemoglobin 03/06/2015 12.8  12.0 - 15.0 g/dL Final  . HCT 03/06/2015 36.9  36.0 - 46.0 % Final  . MCV 03/06/2015 84.8  78.0 - 100.0 fL Final  . MCH 03/06/2015 29.4  26.0 - 34.0 pg Final  . MCHC 03/06/2015 34.7  30.0 - 36.0 g/dL Final  . RDW 03/06/2015 12.2  11.5 - 15.5 % Final  . Platelets 03/06/2015 257  150 - 400 K/uL Final  . Neutrophils Relative % 03/06/2015 72  43 - 77 % Final  . Neutro Abs 03/06/2015 4.4  1.7 - 7.7 K/uL Final  . Lymphocytes Relative 03/06/2015 22  12 - 46 % Final  . Lymphs Abs 03/06/2015 1.4  0.7 - 4.0 K/uL Final  . Monocytes Relative 03/06/2015 6  3 - 12 % Final  . Monocytes Absolute 03/06/2015 0.4  0.1 - 1.0 K/uL Final  . Eosinophils Relative 03/06/2015 0  0 - 5 % Final  . Eosinophils Absolute 03/06/2015 0.0  0.0 - 0.7 K/uL Final  . Basophils Relative 03/06/2015 0  0 - 1 %  Final  . Basophils Absolute 03/06/2015 0.0  0.0 - 0.1 K/uL Final  . Sodium 03/06/2015 141  135 - 145 mmol/L Final  . Potassium 03/06/2015 3.7  3.5 - 5.1 mmol/L Final  . Chloride 03/06/2015 103  101 - 111 mmol/L Final  . CO2 03/06/2015 27  22 - 32 mmol/L Final  . Glucose, Bld 03/06/2015 120* 65 - 99 mg/dL Final  . BUN 03/06/2015 11  6 - 20 mg/dL Final  . Creatinine, Ser 03/06/2015 0.46  0.44 - 1.00 mg/dL Final  . Calcium 03/06/2015 9.7  8.9 - 10.3 mg/dL Final  . GFR calc non Af Amer 03/06/2015 >60  >60 mL/min Final  . GFR calc Af Amer 03/06/2015 >60  >60 mL/min Final   Comment: (NOTE) The eGFR has been calculated  using the CKD EPI equation. This calculation has not been validated in all clinical situations. eGFR's persistently <60 mL/min signify possible Chronic Kidney Disease.   . Anion gap 03/06/2015 11  5 - 15 Final  . Color, Urine 03/06/2015 YELLOW  YELLOW Final  . APPearance 03/06/2015 CLEAR  CLEAR Final  . Specific Gravity, Urine 03/06/2015 1.025  1.005 - 1.030 Final  . pH 03/06/2015 7.0  5.0 - 8.0 Final  . Glucose, UA 03/06/2015 NEGATIVE  NEGATIVE mg/dL Final  . Hgb urine dipstick 03/06/2015 SMALL* NEGATIVE Final  . Bilirubin Urine 03/06/2015 NEGATIVE  NEGATIVE Final  . Ketones, ur 03/06/2015 NEGATIVE  NEGATIVE mg/dL Final  . Protein, ur 03/06/2015 100* NEGATIVE mg/dL Final  . Urobilinogen, UA 03/06/2015 0.2  0.0 - 1.0 mg/dL Final  . Nitrite 03/06/2015 NEGATIVE  NEGATIVE Final  . Leukocytes, UA 03/06/2015 NEGATIVE  NEGATIVE Final  . Lamotrigine Lvl 03/06/2015 None Detected  2.0 - 20.0 ug/mL Final   Comment: (NOTE)                                Detection Limit = 1.0 Performed At: [facility] [place], [place] [id number] Lindon Romp MD WU:[id number]   . Lactic Acid, Venous 03/06/2015 1.62  0.5 - 2.0 mmol/L Final  . Squamous Epithelial / LPF 03/06/2015 RARE  RARE Final  . WBC, UA 03/06/2015 0-2  <3 WBC/hpf Final  . RBC / HPF 03/06/2015 0-2  <3 RBC/hpf Final  . Bacteria, UA 03/06/2015 RARE  RARE Final  Admission on 02/15/2015, Discharged on 02/16/2015  Component Date Value Ref Range Status  . Sodium 02/15/2015 139  135 - 145 mmol/L Final  . Potassium 02/15/2015 4.2  3.5 - 5.1 mmol/L Final  . Chloride 02/15/2015 105  101 - 111 mmol/L Final  . CO2 02/15/2015 28  22 - 32 mmol/L Final  . Glucose, Bld 02/15/2015 91  65 - 99 mg/dL Final  . BUN 02/15/2015 14  6 - 20 mg/dL Final  . Creatinine, Ser 02/15/2015 0.50  0.44 - 1.00 mg/dL Final  . Calcium 02/15/2015 9.8  8.9 - 10.3 mg/dL Final  . Total Protein 02/15/2015 6.4* 6.5 - 8.1 g/dL Final  . Albumin 02/15/2015  3.8  3.5 - 5.0 g/dL Final  . AST 02/15/2015 13* 15 - 41 U/L Final  . ALT 02/15/2015 11* 14 - 54 U/L Final  . Alkaline Phosphatase 02/15/2015 62  38 - 126 U/L Final  . Total Bilirubin 02/15/2015 0.6  0.3 - 1.2 mg/dL Final  . GFR calc non Af Amer 02/15/2015 >60  >60 mL/min Final  . GFR calc Af  Amer 02/15/2015 >60  >60 mL/min Final   Comment: (NOTE) The eGFR has been calculated using the CKD EPI equation. This calculation has not been validated in all clinical situations. eGFR's persistently <60 mL/min signify possible Chronic Kidney Disease.   . Anion gap 02/15/2015 6  5 - 15 Final  . Alcohol, Ethyl (B) 02/15/2015 <5  <5 mg/dL Final   Comment:        LOWEST DETECTABLE LIMIT FOR SERUM ALCOHOL IS 11 mg/dL FOR MEDICAL PURPOSES ONLY   . Color, Urine 02/15/2015 YELLOW  YELLOW Final  . APPearance 02/15/2015 CLEAR  CLEAR Final  . Specific Gravity, Urine 02/15/2015 1.015  1.005 - 1.030 Final  . pH 02/15/2015 6.0  5.0 - 8.0 Final  . Glucose, UA 02/15/2015 NEGATIVE  NEGATIVE mg/dL Final  . Hgb urine dipstick 02/15/2015 TRACE* NEGATIVE Final  . Bilirubin Urine 02/15/2015 NEGATIVE  NEGATIVE Final  . Ketones, ur 02/15/2015 NEGATIVE  NEGATIVE mg/dL Final  . Protein, ur 02/15/2015 NEGATIVE  NEGATIVE mg/dL Final  . Urobilinogen, UA 02/15/2015 0.2  0.0 - 1.0 mg/dL Final  . Nitrite 02/15/2015 NEGATIVE  NEGATIVE Final  . Leukocytes, UA 02/15/2015 NEGATIVE  NEGATIVE Final  . Opiates 02/15/2015 NONE DETECTED  NONE DETECTED Final  . Cocaine 02/15/2015 NONE DETECTED  NONE DETECTED Final  . Benzodiazepines 02/15/2015 NONE DETECTED  NONE DETECTED Final  . Amphetamines 02/15/2015 NONE DETECTED  NONE DETECTED Final  . Tetrahydrocannabinol 02/15/2015 NONE DETECTED  NONE DETECTED Final  . Barbiturates 02/15/2015 POSITIVE* NONE DETECTED Final   Comment:        DRUG SCREEN FOR MEDICAL PURPOSES ONLY.  IF CONFIRMATION IS NEEDED FOR ANY PURPOSE, NOTIFY LAB WITHIN 5 DAYS.        LOWEST DETECTABLE LIMITS FOR  URINE DRUG SCREEN Drug Class       Cutoff (ng/mL) Amphetamine      1000 Barbiturate      200 Benzodiazepine   419 Tricyclics       622 Opiates          300 Cocaine          300 THC              50   . Phenobarbital 02/15/2015 20.1  15.0 - 40.0 ug/mL Final  . WBC 02/15/2015 5.2  4.0 - 10.5 K/uL Final  . RBC 02/15/2015 4.11  3.87 - 5.11 MIL/uL Final  . Hemoglobin 02/15/2015 12.0  12.0 - 15.0 g/dL Final  . HCT 02/15/2015 35.4* 36.0 - 46.0 % Final  . MCV 02/15/2015 86.1  78.0 - 100.0 fL Final  . MCH 02/15/2015 29.2  26.0 - 34.0 pg Final  . MCHC 02/15/2015 33.9  30.0 - 36.0 g/dL Final  . RDW 02/15/2015 12.1  11.5 - 15.5 % Final  . Platelets 02/15/2015 257  150 - 400 K/uL Final  . Neutrophils Relative % 02/15/2015 60  43 - 77 % Final  . Neutro Abs 02/15/2015 3.1  1.7 - 7.7 K/uL Final  . Lymphocytes Relative 02/15/2015 33  12 - 46 % Final  . Lymphs Abs 02/15/2015 1.7  0.7 - 4.0 K/uL Final  . Monocytes Relative 02/15/2015 6  3 - 12 % Final  . Monocytes Absolute 02/15/2015 0.3  0.1 - 1.0 K/uL Final  . Eosinophils Relative 02/15/2015 0  0 - 5 % Final  . Eosinophils Absolute 02/15/2015 0.0  0.0 - 0.7 K/uL Final  . Basophils Relative 02/15/2015 1  0 - 1 % Final  . Basophils  Absolute 02/15/2015 0.0  0.0 - 0.1 K/uL Final  . Ammonia 02/15/2015 18  9 - 35 umol/L Final  . Squamous Epithelial / LPF 02/15/2015 RARE  RARE Final  . RBC / HPF 02/15/2015 0-2  <3 RBC/hpf Final    Ct Head Wo Contrast  05/14/2015   CLINICAL DATA:  Trauma, injury. Fall forward from wheelchair landing on face. Evaluate for intracranial bleed or cervical spine injury.  EXAM: CT HEAD WITHOUT CONTRAST  CT CERVICAL SPINE WITHOUT CONTRAST  TECHNIQUE: Multidetector CT imaging of the head and cervical spine was performed following the standard protocol without intravenous contrast. Multiplanar CT image reconstructions of the cervical spine were also generated.  COMPARISON:  Most recent head and cervical spine CT 01/28/2015   FINDINGS: CT HEAD FINDINGS  No intracranial hemorrhage, mass effect, or midline shift. No hydrocephalus. The basilar cisterns are patent. No evidence of territorial infarct. No intracranial fluid collection. Cerebellar atrophy is unchanged. Questionable right frontal scalp swelling. Calvarium is intact. Included paranasal sinuses and mastoid air cells are well aerated.  CT CERVICAL SPINE FINDINGS  Cervical spine alignment is maintained. Vertebral body heights preserved. There is no fracture. The dens is intact. There are no jumped or perched facets. Mild diffuse disc space narrowing and endplate spurring most prominent in the lower cervical spine. Prominent Schmorl's node involving inferior endplate of T2. No prevertebral soft tissue edema.  IMPRESSION: 1. No acute intracranial abnormality. Cerebellar atrophy is unchanged. 2. No acute fracture or subluxation in the cervical spine.   Electronically Signed   By: Jeb Levering M.D.   On: 05/14/2015 23:13   Ct Cervical Spine Wo Contrast  05/14/2015   CLINICAL DATA:  Trauma, injury. Fall forward from wheelchair landing on face. Evaluate for intracranial bleed or cervical spine injury.  EXAM: CT HEAD WITHOUT CONTRAST  CT CERVICAL SPINE WITHOUT CONTRAST  TECHNIQUE: Multidetector CT imaging of the head and cervical spine was performed following the standard protocol without intravenous contrast. Multiplanar CT image reconstructions of the cervical spine were also generated.  COMPARISON:  Most recent head and cervical spine CT 01/28/2015  FINDINGS: CT HEAD FINDINGS  No intracranial hemorrhage, mass effect, or midline shift. No hydrocephalus. The basilar cisterns are patent. No evidence of territorial infarct. No intracranial fluid collection. Cerebellar atrophy is unchanged. Questionable right frontal scalp swelling. Calvarium is intact. Included paranasal sinuses and mastoid air cells are well aerated.  CT CERVICAL SPINE FINDINGS  Cervical spine alignment is  maintained. Vertebral body heights preserved. There is no fracture. The dens is intact. There are no jumped or perched facets. Mild diffuse disc space narrowing and endplate spurring most prominent in the lower cervical spine. Prominent Schmorl's node involving inferior endplate of T2. No prevertebral soft tissue edema.  IMPRESSION: 1. No acute intracranial abnormality. Cerebellar atrophy is unchanged. 2. No acute fracture or subluxation in the cervical spine.   Electronically Signed   By: Jeb Levering M.D.   On: 05/14/2015 23:13   Dg Chest Portable 1 View  05/14/2015   CLINICAL DATA:  Fall with seizure.  Evaluate bleed.  EXAM: PORTABLE CHEST - 1 VIEW  COMPARISON:  Chest radiograph 02/15/2015  FINDINGS: Lung volumes remain low. Patient is rotated. Heart at the upper limits of normal in size. Mild vascular congestion without overt edema. Mild bibasilar atelectasis, left greater than right. No large pleural effusion. No evident pneumothorax. No acute osseous abnormalities are seen.  IMPRESSION: 1. No evidence of acute traumatic injury allowing for low lung volumes and  rotation. 2. Mild vascular congestion and bibasilar atelectasis.   Electronically Signed   By: Jeb Levering M.D.   On: 05/14/2015 22:41     Assessment/Plan   ICD-9-CM ICD-10-CM   1. Seizure - stable 780.39 R56.9   2. 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 Recurrent falls - due to #3 V15.88 R29.6   5. Hyperlipidemia - stable 272.4 E78.5   6. Intellectual disability - stable 319 F79     --check CMP, tegretol and phenobarbital level on 8/31  --cont current meds as ordered  --PT as ordered. OT/ST as indicated  --GOAL: short term rehab and d/c to ALF when medically appropriate. Communicated with pt and nursing.  --will follow  Fergie Sherbert S. Perlie Gold  [facility] [address] [place], [place]  [address] [phone] Cell (Monday-Friday 8 AM - 5 PM) [phone] After 5 PM and follow prompts

## 2015-06-12 ENCOUNTER — Non-Acute Institutional Stay (SKILLED_NURSING_FACILITY): Payer: Medicare Other | Admitting: Adult Health

## 2015-06-12 DIAGNOSIS — R569 Unspecified convulsions: Secondary | ICD-10-CM

## 2015-06-12 DIAGNOSIS — E559 Vitamin D deficiency, unspecified: Secondary | ICD-10-CM

## 2015-06-12 DIAGNOSIS — E785 Hyperlipidemia, unspecified: Secondary | ICD-10-CM | POA: Diagnosis not present

## 2015-06-12 DIAGNOSIS — R600 Localized edema: Secondary | ICD-10-CM | POA: Diagnosis not present

## 2015-06-12 DIAGNOSIS — R32 Unspecified urinary incontinence: Secondary | ICD-10-CM

## 2015-06-12 DIAGNOSIS — F411 Generalized anxiety disorder: Secondary | ICD-10-CM

## 2015-06-12 DIAGNOSIS — I10 Essential (primary) hypertension: Secondary | ICD-10-CM

## 2015-06-12 DIAGNOSIS — G2119 Other drug induced secondary parkinsonism: Secondary | ICD-10-CM | POA: Diagnosis not present

## 2015-06-16 LAB — LIPID PANEL
CHOLESTEROL: 146 mg/dL (ref 0–200)
HDL: 55 mg/dL (ref 35–70)
LDL Cholesterol: 79 mg/dL
TRIGLYCERIDES: 58 mg/dL (ref 40–160)

## 2015-06-16 LAB — HEMOGLOBIN A1C: Hemoglobin A1C: 5.3

## 2015-07-17 ENCOUNTER — Non-Acute Institutional Stay (SKILLED_NURSING_FACILITY): Payer: Medicare Other | Admitting: Adult Health

## 2015-07-17 DIAGNOSIS — R600 Localized edema: Secondary | ICD-10-CM | POA: Diagnosis not present

## 2015-07-17 DIAGNOSIS — R32 Unspecified urinary incontinence: Secondary | ICD-10-CM

## 2015-07-17 DIAGNOSIS — I10 Essential (primary) hypertension: Secondary | ICD-10-CM

## 2015-07-17 DIAGNOSIS — G2119 Other drug induced secondary parkinsonism: Secondary | ICD-10-CM | POA: Diagnosis not present

## 2015-07-17 DIAGNOSIS — E559 Vitamin D deficiency, unspecified: Secondary | ICD-10-CM | POA: Diagnosis not present

## 2015-07-17 DIAGNOSIS — R569 Unspecified convulsions: Secondary | ICD-10-CM

## 2015-07-17 DIAGNOSIS — E785 Hyperlipidemia, unspecified: Secondary | ICD-10-CM

## 2015-07-31 DIAGNOSIS — F411 Generalized anxiety disorder: Secondary | ICD-10-CM | POA: Insufficient documentation

## 2015-07-31 DIAGNOSIS — E559 Vitamin D deficiency, unspecified: Secondary | ICD-10-CM | POA: Insufficient documentation

## 2015-07-31 DIAGNOSIS — R32 Unspecified urinary incontinence: Secondary | ICD-10-CM | POA: Insufficient documentation

## 2015-07-31 DIAGNOSIS — R600 Localized edema: Secondary | ICD-10-CM | POA: Insufficient documentation

## 2015-07-31 MED ORDER — CLONAZEPAM 1 MG PO TABS: 1.0000 mg | ORAL_TABLET | Freq: Every day | ORAL | Status: DC

## 2015-07-31 MED ORDER — QUETIAPINE FUMARATE 50 MG PO TABS: 25.0000 mg | ORAL_TABLET | Freq: Two times a day (BID) | ORAL | Status: DC

## 2015-07-31 NOTE — Progress Notes (Signed)
Patient ID: Christine EagleDebra L Pena, female   DOB: [birth year], 59 y.o.   MRN: [id number]    [facility]      Allergies  Allergen Reactions  . Depakote [Valproic Acid]     altered mental status/ high ammonia  . Haldol [Haloperidol Lactate]     Unknown reaction.    Chief Complaint  Patient presents with  . Medical Management of Chronic Issues    HPI:  She is a long term resident of this facility being seen for the management of her chronic illnesses. Overall her status is without change. She does continue to yell out daily and is wanting to go home. She is being followed by psych services. She is unable to participate in the hpi or ros. There are no nursing concerns at this time.    Past Medical History  Diagnosis Date  . Hypertension   . Seizures   . Mental retardation   . Hyperlipidemia   . Anxiety   . Vitamin D deficiency   . Obesity   . Dementia   . 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  VITAL SIGNS BP 109/57 mmHg  Pulse 66  Ht 5\' 5"  (1.651 m)  Wt 196 lb 12.8 oz (89.268 kg)  BMI 32.75 kg/m2  SpO2 93%  Patient's Medications  New Prescriptions   No medications on file  Previous Medications   ACETAMINOPHEN (TYLENOL) 500 MG TABLET    Take 500 mg by mouth every 4 (four) hours as needed for mild pain or fever.    ATORVASTATIN (LIPITOR) 10 MG TABLET    Take 10 mg by mouth daily.   BENZTROPINE (COGENTIN) 1 MG TABLET    Take 1 mg by mouth nightly    CLONAZEPAM (KLONOPIN) 1 MG TABLET    Take 1 mg by mouth 2 (two) times daily.   FUROSEMIDE (LASIX) 20 MG TABLET    Take 20 mg by mouth 2 (two) times daily.   HYDROCORTISONE (ANUSOL-HC) 2.5 % RECTAL CREAM    Place 1 application rectally 2 (two) times daily.   LEVETIRACETAM (KEPPRA) 1000 MG TABLET    Take 1 tablet (1,000 mg total) by mouth See admin instructions. Take 1.5 tablet in morning and 1 table in evening.   LISINOPRIL (PRINIVIL,ZESTRIL) 10 MG TABLET    Take 10 mg  by mouth daily.   OXYBUTYNIN (DITROPAN-XL) 5 MG 24 HR TABLET    Take 5 mg by mouth daily.   PHENOBARBITAL (LUMINAL) 64.8 MG TABLET    Take 2 tablets (129.6 mg total) by mouth at bedtime.   QUETIAPINE (SEROQUEL) 50 MG TABLET    Take 1 tablet (50 mg total) by mouth at bedtime.   THIOTHIXENE (NAVANE) 10 MG CAPSULE    Take 10 mg by mouth at bedtime.    VITAMIN D, ERGOCALCIFEROL, (DRISDOL) 50000 UNITS CAPS CAPSULE    Take 50,000 Units by mouth every 7 (seven) days. Takes on Saturdays.   VITAMIN E (VITAMIN E) 200 UNIT CAPSULE    Take 200 Units by mouth daily.  Modified Medications   No medications on file  Discontinued Medications     SIGNIFICANT DIAGNOSTIC EXAMS  05-14-15: chest x-ray: 1. No evidence of acute traumatic injury allowing for low lung volumes and rotation. 2. Mild vascular congestion and bibasilar atelectasis.  05-14-15: ct of head and cervical spine: 1. No acute intracranial abnormality. Cerebellar atrophy is unchanged. 2. No acute fracture or subluxation in the cervical spine.  LABS REVIEWED:   05-14-15: wbc 8.1; hgb 12.9; hct 37.2; mcv 86.9; plt 285; glucose 116; bun 11; creat 1.24; k+ 3.5; na++144 05-15-15: wbc 6.9; hgb 11.7; hct 34.;3 mcv 86.8; plt 251; glucose 104; bun 8; creat 0.51; k+ 3.3; na++144; phenobarb 11.9  05-25-15: phenobarb 16.6     Review of Systems  Unable to perform ROS: Dementia      Physical Exam  Constitutional: No distress.  Obese   Eyes: Conjunctivae are normal.  Neck: Neck supple. No JVD present. No thyromegaly present.  Cardiovascular: Normal rate, regular rhythm and intact distal pulses.   Respiratory: Effort normal and breath sounds normal. No respiratory distress. She has no wheezes.  GI: Soft. Bowel sounds are normal. She exhibits no distension. There is no tenderness.  Musculoskeletal: She exhibits no edema.  Able to move all extremities   Lymphadenopathy:    She has no cervical adenopathy.  Neurological: She is alert.  Skin: Skin  is warm and dry. She is not diaphoretic.  Psychiatric: She has a normal mood and affect.       ASSESSMENT/ PLAN:  1. Seizures: no reports of recent seizure activity; will continue keppra 1 gm in the AM and 1500 mg in the PM will continue phenobarbital 129.6 mg nightly her phenobarb level is 16.6   2. Hypertension: will continue lisinopril 10 mg daily  3. Dyslipidemia: will continue lipitor 10 mg daily   4. Vit d deficiency: will continue vit d 50,000 units weekly   5. Lower extremity edema: will continue lasix 20 mg twice daily   6. UI: will continue ditropan xl 5 mg daily   7. Hemorrhoids: will continue anusol twice daily   8. Psychosis: is without change will continue seroquel 50 mg nightly and will continue navane 10 mg nightly   9. Secondary parkinson's disease: will continue cogentin 1 mg nightly   10. Anxiety: will change her klonopin to 1 mg in the am and daily as needed   Will check hgb a1c vit d and lipids      Synthia Innocent NP [facility]  Contact [phone] Monday through Friday 8am- 5pm  After hours call [phone]

## 2015-08-16 ENCOUNTER — Encounter (HOSPITAL_COMMUNITY): Payer: Self-pay | Admitting: Emergency Medicine

## 2015-08-16 ENCOUNTER — Inpatient Hospital Stay (HOSPITAL_COMMUNITY)
Admission: EM | Admit: 2015-08-16 | Discharge: 2015-08-21 | DRG: 871 | Disposition: A | Discharge status: Skilled Nursing Facility | Payer: Medicare Other | Source: Skilled Nursing Facility | Attending: Internal Medicine | Admitting: Internal Medicine

## 2015-08-16 DIAGNOSIS — R918 Other nonspecific abnormal finding of lung field: Secondary | ICD-10-CM | POA: Diagnosis not present

## 2015-08-16 DIAGNOSIS — I10 Essential (primary) hypertension: Secondary | ICD-10-CM | POA: Diagnosis not present

## 2015-08-16 DIAGNOSIS — F419 Anxiety disorder, unspecified: Secondary | ICD-10-CM | POA: Diagnosis not present

## 2015-08-16 DIAGNOSIS — A419 Sepsis, unspecified organism: Principal | ICD-10-CM | POA: Diagnosis present

## 2015-08-16 DIAGNOSIS — Y95 Nosocomial condition: Secondary | ICD-10-CM | POA: Diagnosis present

## 2015-08-16 DIAGNOSIS — G40909 Epilepsy, unspecified, not intractable, without status epilepticus: Secondary | ICD-10-CM | POA: Diagnosis present

## 2015-08-16 DIAGNOSIS — F29 Unspecified psychosis not due to a substance or known physiological condition: Secondary | ICD-10-CM | POA: Diagnosis not present

## 2015-08-16 DIAGNOSIS — F411 Generalized anxiety disorder: Secondary | ICD-10-CM

## 2015-08-16 DIAGNOSIS — G219 Secondary parkinsonism, unspecified: Secondary | ICD-10-CM | POA: Diagnosis present

## 2015-08-16 DIAGNOSIS — J189 Pneumonia, unspecified organism: Secondary | ICD-10-CM | POA: Diagnosis present

## 2015-08-16 DIAGNOSIS — F79 Unspecified intellectual disabilities: Secondary | ICD-10-CM | POA: Diagnosis not present

## 2015-08-16 DIAGNOSIS — R32 Unspecified urinary incontinence: Secondary | ICD-10-CM | POA: Diagnosis present

## 2015-08-16 DIAGNOSIS — R569 Unspecified convulsions: Secondary | ICD-10-CM

## 2015-08-16 MED ORDER — LORAZEPAM 2 MG/ML IJ SOLN
INTRAMUSCULAR | Status: AC
  Filled 2015-08-16: qty 1

## 2015-08-16 MED ORDER — LORAZEPAM 2 MG/ML IJ SOLN
2.0000 mg | Freq: Once | INTRAMUSCULAR | Status: AC
  Administered 2015-08-16: 2 mg via INTRAVENOUS

## 2015-08-16 MED ORDER — LEVETIRACETAM 500 MG/5ML IV SOLN
1000.0000 mg | Freq: Once | INTRAVENOUS | Status: AC
  Administered 2015-08-17: 1000 mg via INTRAVENOUS
  Filled 2015-08-16: qty 10

## 2015-08-16 NOTE — ED Notes (Signed)
[facility] EMT notified this RN that pt was seizing, upon entering room pt having seizure, HOB placed flat, O2 placed on pt, EDP notified and at bedside, order to 2mg  Ativan.

## 2015-08-16 NOTE — ED Notes (Signed)
Pt in EMS from [facility], staff says pt experienced seizure, upon arrival EMS stated pt was in post-ictal like state. Pt does have hx parkinsons, schizophrenia, developmentally disabled.

## 2015-08-16 NOTE — ED Provider Notes (Signed)
CSN: [id number]     Arrival date & time 08/16/15  2334 History  By signing my name below, I, Sonum Patel, attest that this documentation has been prepared under the direction and in the presence of Gilda Crease, MD. Electronically Signed: Sonum Patel, Neurosurgeon. 08/16/2015. 11:35 PM.    Chief Complaint  Patient presents with  . Seizures   The history is provided by the patient. No language interpreter was used.     Level 5 Caveat: Patient is nonverbal  HPI Comments: Christine Pena is a 59 y.o. female with past medical history of seizures, mental retardation, Parkinson Disease who presents to the Emergency Department from [facility] complaining of 3 ten second seizures that occurred PTA. Per EMS, patient has a history of grand mal seizures and takes Keppra and phenobarbital.   Past Medical History  Diagnosis Date  . Hypertension   . Seizures   . Mental retardation   . Hyperlipidemia   . Anxiety   . Vitamin D deficiency   . Obesity   . Dementia   . Secondary Parkinson disease 05/04/2015   No past surgical history on file. Family History  Problem Relation Age of Onset  . Heart disease Mother    Social History  Substance Use Topics  . Smoking status: Never Smoker   . Smokeless tobacco: Never Used  . Alcohol Use: No   OB History    No data available     Review of Systems  Unable to perform ROS: Patient nonverbal   Allergies  Depakote and Haldol  Home Medications   Prior to Admission medications   Medication Sig Start Date End Date Taking? Authorizing Provider  acetaminophen (TYLENOL) 500 MG tablet Take 500 mg by mouth every 4 (four) hours as needed for mild pain or fever.     Historical Provider, MD  atorvastatin (LIPITOR) 10 MG tablet Take 10 mg by mouth daily.    Historical Provider, MD  benztropine (COGENTIN) 1 MG tablet Take 1 mg by mouth 2 (two) times daily.    Historical Provider, MD  clonazePAM (KLONOPIN) 1 MG tablet Take 1 tablet (1 mg  total) by mouth daily. Take 1 mg in the AM and daily as NEEDED 07/31/15   Sharee Holster, NP  furosemide (LASIX) 20 MG tablet Take 20 mg by mouth 2 (two) times daily.    Historical Provider, MD  hydrocortisone (ANUSOL-HC) 2.5 % rectal cream Place 1 application rectally 2 (two) times daily.    Historical Provider, MD  levETIRAcetam (KEPPRA) 1000 MG tablet Take 1 tablet (1,000 mg total) by mouth See admin instructions. Take 1.5 tablet in morning and 1 table in evening. 05/14/15   Ames Dura, MD  lisinopril (PRINIVIL,ZESTRIL) 10 MG tablet Take 10 mg by mouth daily.    Historical Provider, MD  oxybutynin (DITROPAN-XL) 5 MG 24 hr tablet Take 5 mg by mouth daily.    Historical Provider, MD  PHENobarbital (LUMINAL) 64.8 MG tablet Take 2 tablets (129.6 mg total) by mouth at bedtime. 05/19/15   Kirt Boys, DO  QUEtiapine (SEROQUEL) 50 MG tablet Take 0.5-1 tablets (25-50 mg total) by mouth 2 (two) times daily. Take 25 mg in the AM and 50 mg in the PM 07/31/15   Sharee Holster, NP  thiothixene (NAVANE) 10 MG capsule Take 10 mg by mouth at bedtime.     Historical Provider, MD  Vitamin D, Ergocalciferol, (DRISDOL) 50000 UNITS CAPS capsule Take 50,000 Units by mouth every 7 (seven)  days. Takes on Saturdays.    Historical Provider, MD  vitamin E (VITAMIN E) 200 UNIT capsule Take 200 Units by mouth daily.    Historical Provider, MD   There were no vitals taken for this visit. Physical Exam  Constitutional: She appears well-developed and well-nourished. No distress.  Awake and alert   HENT:  Head: Normocephalic and atraumatic.  Right Ear: Hearing normal.  Left Ear: Hearing normal.  Nose: Nose normal.  Mouth/Throat: Oropharynx is clear and moist and mucous membranes are normal.  Eyes: Conjunctivae and EOM are normal. Pupils are equal, round, and reactive to light.  Neck: Normal range of motion. Neck supple.  Cardiovascular: Regular rhythm, S1 normal and S2 normal.  Exam reveals no gallop and no friction  rub.   No murmur heard. Pulmonary/Chest: Effort normal and breath sounds normal. No respiratory distress. She exhibits no tenderness.  Abdominal: Soft. Normal appearance and bowel sounds are normal. There is no hepatosplenomegaly. There is no tenderness. There is no rebound, no guarding, no tenderness at McBurney's point and negative Murphy's sign. No hernia.  Musculoskeletal: Normal range of motion.  Neurological: She is alert. She has normal strength. No sensory deficit. GCS eye subscore is 4. GCS verbal subscore is 5. GCS motor subscore is 6.  Skin: Skin is warm, dry and intact. No rash noted. No cyanosis.  Psychiatric: She has a normal mood and affect. Her speech is normal and behavior is normal. Thought content normal.  Nursing note and vitals reviewed.   ED Course  Procedures (including critical care time)  DIAGNOSTIC STUDIES: Oxygen Saturation is 96% on RA, adequate by my interpretation.    COORDINATION OF CARE: 11:38 PM Will order CXR and labs. Will give Ativan and Keppra.    Labs Review Labs Reviewed - No data to display  Imaging Review No results found. I have personally reviewed and evaluated these images and lab results as part of my medical decision-making.   EKG Interpretation None      MDM   Final diagnoses:  None   multiple seizures Healthcare associated pneumonia  Presents to the emergency department for evaluation of multiple seizures. Patient does have a history of seizures. She had 2 seizures prior to arrival. Patient is mainly nonverbal, information provided by EMS via nursing home staff. She did not appear to be any significant distress at arrival. She takes Keppra and phenobarbital. Phenobarbital level is low. She had a generalized tonic-clonic seizure here in the ER that was self-limited less than 2 minutes. She was given Ativan and Keppra IV. She has not had any further seizures.  Concerning for pneumonia. She will be treated for HCAP. Patient to be  admitted to the hospital.  I personally performed the services described in this documentation, which was scribed in my presence. The recorded information has been reviewed and is accurate.    Gilda Creasehristopher J Bobbyjoe Pabst, MD 08/17/15 [phone]

## 2015-08-17 ENCOUNTER — Encounter (HOSPITAL_COMMUNITY): Payer: Self-pay | Admitting: Family Medicine

## 2015-08-17 ENCOUNTER — Emergency Department (HOSPITAL_COMMUNITY): Payer: Medicare Other

## 2015-08-17 DIAGNOSIS — F29 Unspecified psychosis not due to a substance or known physiological condition: Secondary | ICD-10-CM | POA: Diagnosis not present

## 2015-08-17 DIAGNOSIS — F79 Unspecified intellectual disabilities: Secondary | ICD-10-CM | POA: Diagnosis not present

## 2015-08-17 DIAGNOSIS — Y95 Nosocomial condition: Secondary | ICD-10-CM | POA: Diagnosis present

## 2015-08-17 DIAGNOSIS — E785 Hyperlipidemia, unspecified: Secondary | ICD-10-CM | POA: Diagnosis not present

## 2015-08-17 DIAGNOSIS — F419 Anxiety disorder, unspecified: Secondary | ICD-10-CM | POA: Diagnosis present

## 2015-08-17 DIAGNOSIS — G219 Secondary parkinsonism, unspecified: Secondary | ICD-10-CM | POA: Diagnosis present

## 2015-08-17 DIAGNOSIS — R918 Other nonspecific abnormal finding of lung field: Secondary | ICD-10-CM | POA: Diagnosis not present

## 2015-08-17 DIAGNOSIS — F0391 Unspecified dementia with behavioral disturbance: Secondary | ICD-10-CM | POA: Diagnosis not present

## 2015-08-17 DIAGNOSIS — R350 Frequency of micturition: Secondary | ICD-10-CM | POA: Diagnosis not present

## 2015-08-17 DIAGNOSIS — J189 Pneumonia, unspecified organism: Secondary | ICD-10-CM | POA: Diagnosis present

## 2015-08-17 DIAGNOSIS — R569 Unspecified convulsions: Secondary | ICD-10-CM | POA: Diagnosis not present

## 2015-08-17 DIAGNOSIS — G4089 Other seizures: Secondary | ICD-10-CM | POA: Diagnosis not present

## 2015-08-17 DIAGNOSIS — R32 Unspecified urinary incontinence: Secondary | ICD-10-CM | POA: Diagnosis present

## 2015-08-17 DIAGNOSIS — I10 Essential (primary) hypertension: Secondary | ICD-10-CM | POA: Diagnosis not present

## 2015-08-17 DIAGNOSIS — R296 Repeated falls: Secondary | ICD-10-CM | POA: Diagnosis not present

## 2015-08-17 DIAGNOSIS — A419 Sepsis, unspecified organism: Secondary | ICD-10-CM | POA: Diagnosis present

## 2015-08-17 DIAGNOSIS — F209 Schizophrenia, unspecified: Secondary | ICD-10-CM | POA: Diagnosis not present

## 2015-08-17 DIAGNOSIS — G40909 Epilepsy, unspecified, not intractable, without status epilepticus: Secondary | ICD-10-CM | POA: Diagnosis present

## 2015-08-17 DIAGNOSIS — G218 Other secondary parkinsonism: Secondary | ICD-10-CM | POA: Diagnosis not present

## 2015-08-17 LAB — CBC WITH DIFFERENTIAL/PLATELET
Basophils Absolute: 0 10*3/uL (ref 0.0–0.1)
Basophils Relative: 0 %
EOS ABS: 0 10*3/uL (ref 0.0–0.7)
Eosinophils Relative: 0 %
HCT: 34.4 % — ABNORMAL LOW (ref 36.0–46.0)
HEMOGLOBIN: 11.5 g/dL — AB (ref 12.0–15.0)
LYMPHS ABS: 1 10*3/uL (ref 0.7–4.0)
Lymphocytes Relative: 15 %
MCH: 28.5 pg (ref 26.0–34.0)
MCHC: 33.4 g/dL (ref 30.0–36.0)
MCV: 85.1 fL (ref 78.0–100.0)
MONOS PCT: 3 %
Monocytes Absolute: 0.2 10*3/uL (ref 0.1–1.0)
NEUTROS PCT: 82 %
Neutro Abs: 5.2 10*3/uL (ref 1.7–7.7)
PLATELETS: 340 10*3/uL (ref 150–400)
RBC: 4.04 MIL/uL (ref 3.87–5.11)
RDW: 12.4 % (ref 11.5–15.5)
WBC: 6.4 10*3/uL (ref 4.0–10.5)

## 2015-08-17 LAB — BASIC METABOLIC PANEL
ANION GAP: 7 (ref 5–15)
BUN: 13 mg/dL (ref 6–20)
CALCIUM: 9.6 mg/dL (ref 8.9–10.3)
CO2: 27 mmol/L (ref 22–32)
Chloride: 102 mmol/L (ref 101–111)
Creatinine, Ser: 0.49 mg/dL (ref 0.44–1.00)
Glucose, Bld: 95 mg/dL (ref 65–99)
POTASSIUM: 3.9 mmol/L (ref 3.5–5.1)
SODIUM: 136 mmol/L (ref 135–145)

## 2015-08-17 LAB — URINALYSIS, ROUTINE W REFLEX MICROSCOPIC
Bilirubin Urine: NEGATIVE
GLUCOSE, UA: 100 mg/dL — AB
KETONES UR: NEGATIVE mg/dL
Nitrite: NEGATIVE
PH: 5 (ref 5.0–8.0)
Protein, ur: NEGATIVE mg/dL
SPECIFIC GRAVITY, URINE: 1.018 (ref 1.005–1.030)

## 2015-08-17 LAB — COMPREHENSIVE METABOLIC PANEL
ALBUMIN: 3.5 g/dL (ref 3.5–5.0)
ALK PHOS: 57 U/L (ref 38–126)
ALT: 12 U/L — AB (ref 14–54)
ANION GAP: 11 (ref 5–15)
AST: 18 U/L (ref 15–41)
BUN: 16 mg/dL (ref 6–20)
CALCIUM: 9.5 mg/dL (ref 8.9–10.3)
CHLORIDE: 102 mmol/L (ref 101–111)
CO2: 23 mmol/L (ref 22–32)
CREATININE: 0.6 mg/dL (ref 0.44–1.00)
GFR calc Af Amer: >60 mL/min (ref >60)
GFR calc non Af Amer: >60 mL/min (ref >60)
GLUCOSE: 141 mg/dL — AB (ref 65–99)
Potassium: 3.8 mmol/L (ref 3.5–5.1)
SODIUM: 136 mmol/L (ref 135–145)
Total Bilirubin: 0.3 mg/dL (ref 0.3–1.2)
Total Protein: 6.2 g/dL — ABNORMAL LOW (ref 6.5–8.1)

## 2015-08-17 LAB — CBC
HCT: 34 % — ABNORMAL LOW (ref 36.0–46.0)
Hemoglobin: 11.5 g/dL — ABNORMAL LOW (ref 12.0–15.0)
MCH: 28.7 pg (ref 26.0–34.0)
MCHC: 33.8 g/dL (ref 30.0–36.0)
MCV: 84.8 fL (ref 78.0–100.0)
PLATELETS: 333 10*3/uL (ref 150–400)
RBC: 4.01 MIL/uL (ref 3.87–5.11)
RDW: 12.4 % (ref 11.5–15.5)
WBC: 7.3 10*3/uL (ref 4.0–10.5)

## 2015-08-17 LAB — PHENOBARBITAL LEVEL: Phenobarbital: 13 ug/mL — ABNORMAL LOW (ref 15.0–40.0)

## 2015-08-17 LAB — URINE MICROSCOPIC-ADD ON: BACTERIA UA: NONE SEEN

## 2015-08-17 LAB — TROPONIN I: Troponin I: <0.03 ng/mL (ref <0.031)

## 2015-08-17 LAB — MRSA PCR SCREENING: MRSA BY PCR: NEGATIVE

## 2015-08-17 LAB — I-STAT CG4 LACTIC ACID, ED: LACTIC ACID, VENOUS: 1.37 mmol/L (ref 0.5–2.0)

## 2015-08-17 MED ORDER — ATORVASTATIN CALCIUM 10 MG PO TABS
10.0000 mg | ORAL_TABLET | Freq: Every day | ORAL | Status: DC
  Administered 2015-08-17 – 2015-08-21 (×5): 10 mg via ORAL
  Filled 2015-08-17 (×5): qty 1

## 2015-08-17 MED ORDER — PHENOBARBITAL 32.4 MG PO TABS
129.6000 mg | ORAL_TABLET | Freq: Every day | ORAL | Status: DC
  Administered 2015-08-17 – 2015-08-20 (×4): 129.6 mg via ORAL
  Filled 2015-08-17 (×4): qty 4

## 2015-08-17 MED ORDER — OXYBUTYNIN CHLORIDE ER 5 MG PO TB24
5.0000 mg | ORAL_TABLET | Freq: Every day | ORAL | Status: DC
  Administered 2015-08-17 – 2015-08-20 (×4): 5 mg via ORAL
  Filled 2015-08-17 (×4): qty 1

## 2015-08-17 MED ORDER — ACETAMINOPHEN 650 MG RE SUPP: 650.0000 mg | Freq: Four times a day (QID) | RECTAL | Status: DC | PRN

## 2015-08-17 MED ORDER — LEVETIRACETAM 750 MG PO TABS
1500.0000 mg | ORAL_TABLET | Freq: Every day | ORAL | Status: DC
  Administered 2015-08-17 – 2015-08-20 (×4): 1500 mg via ORAL
  Filled 2015-08-17 (×4): qty 2

## 2015-08-17 MED ORDER — ENOXAPARIN SODIUM 40 MG/0.4ML ~~LOC~~ SOLN
40.0000 mg | SUBCUTANEOUS | Status: DC
Start: 2015-08-17 — End: 2015-08-21
  Administered 2015-08-17 – 2015-08-21 (×5): 40 mg via SUBCUTANEOUS
  Filled 2015-08-17 (×5): qty 0.4

## 2015-08-17 MED ORDER — LEVETIRACETAM 500 MG PO TABS
1000.0000 mg | ORAL_TABLET | Freq: Every day | ORAL | Status: DC
  Administered 2015-08-17 – 2015-08-19 (×3): 1000 mg via ORAL
  Filled 2015-08-17 (×3): qty 2

## 2015-08-17 MED ORDER — BENZTROPINE MESYLATE 1 MG PO TABS
1.0000 mg | ORAL_TABLET | Freq: Two times a day (BID) | ORAL | Status: DC
  Administered 2015-08-17 – 2015-08-21 (×9): 1 mg via ORAL
  Filled 2015-08-17 (×9): qty 1

## 2015-08-17 MED ORDER — DEXTROSE 5 % IV SOLN
2.0000 g | Freq: Three times a day (TID) | INTRAVENOUS | Status: DC
  Administered 2015-08-17 – 2015-08-20 (×10): 2 g via INTRAVENOUS
  Filled 2015-08-17 (×11): qty 2

## 2015-08-17 MED ORDER — PIPERACILLIN-TAZOBACTAM 3.375 G IVPB
3.3750 g | Freq: Three times a day (TID) | INTRAVENOUS | Status: DC
  Filled 2015-08-17 (×3): qty 50

## 2015-08-17 MED ORDER — ACETAMINOPHEN 325 MG PO TABS: 650.0000 mg | ORAL_TABLET | Freq: Four times a day (QID) | ORAL | Status: DC | PRN

## 2015-08-17 MED ORDER — SODIUM CHLORIDE 0.9 % IV SOLN: INTRAVENOUS | Status: DC

## 2015-08-17 MED ORDER — QUETIAPINE FUMARATE 25 MG PO TABS
25.0000 mg | ORAL_TABLET | Freq: Every day | ORAL | Status: DC
  Administered 2015-08-17 – 2015-08-21 (×5): 25 mg via ORAL
  Filled 2015-08-17 (×5): qty 1

## 2015-08-17 MED ORDER — CLONAZEPAM 1 MG PO TABS
1.0000 mg | ORAL_TABLET | Freq: Every day | ORAL | Status: DC
  Administered 2015-08-17 – 2015-08-21 (×5): 1 mg via ORAL
  Filled 2015-08-17 (×5): qty 1

## 2015-08-17 MED ORDER — VANCOMYCIN HCL IN DEXTROSE 1-5 GM/200ML-% IV SOLN
1000.0000 mg | Freq: Two times a day (BID) | INTRAVENOUS | Status: DC
  Administered 2015-08-17 – 2015-08-20 (×6): 1000 mg via INTRAVENOUS
  Filled 2015-08-17 (×8): qty 200

## 2015-08-17 MED ORDER — LISINOPRIL 10 MG PO TABS: 10.0000 mg | ORAL_TABLET | Freq: Every day | ORAL | Status: DC

## 2015-08-17 MED ORDER — QUETIAPINE FUMARATE 50 MG PO TABS
50.0000 mg | ORAL_TABLET | Freq: Every day | ORAL | Status: DC
  Administered 2015-08-17 – 2015-08-20 (×4): 50 mg via ORAL
  Filled 2015-08-17 (×4): qty 1

## 2015-08-17 MED ORDER — SODIUM CHLORIDE 0.9 % IJ SOLN
3.0000 mL | Freq: Two times a day (BID) | INTRAMUSCULAR | Status: DC
Start: 2015-08-17 — End: 2015-08-21
  Administered 2015-08-18 – 2015-08-21 (×5): 3 mL via INTRAVENOUS

## 2015-08-17 NOTE — Progress Notes (Signed)
Arrived from ED. Alert and oriented to person. Denies any pain. Oriented to room. Safety measure in place. Call light within reach. Will continue to monitor.

## 2015-08-17 NOTE — ED Notes (Signed)
Attempted report X1

## 2015-08-17 NOTE — Progress Notes (Signed)
ANTIBIOTIC CONSULT NOTE - INITIAL  Pharmacy Consult for Vancomycin/Zosyn  Indication: rule out pneumonia  Allergies  Allergen Reactions  . Depakote [Valproic Acid]     altered mental status/ high ammonia  . Haldol [Haloperidol Lactate]     Unknown reaction.    Patient Measurements: ~90 kg  Vital Signs: Temp: 98.3 F (36.8 C) (11/23 2345) Temp Source: Oral (11/23 2345) BP: 99/58 mmHg (11/24 0345) Pulse Rate: 90 (11/24 0300)  Labs:  Recent Labs  08/17/15 0025  WBC 6.4  HGB 11.5*  PLT 340  CREATININE 0.60    Medical History: Past Medical History  Diagnosis Date  . Hypertension   . Seizures (HCC)   . Mental retardation   . Hyperlipidemia   . Anxiety   . Vitamin D deficiency   . Obesity   . Dementia   . Secondary Parkinson disease (HCC) 05/04/2015    Assessment: 59 y/o F [facility] from [facility] with seizure, CXR with possible PNA, WBC WNL, renal function good, other labs/meds reviewed.   Goal of Therapy:  Vancomycin trough level 15-20 mcg/ml  Plan:  -Vancomycin 1000 mg IV q12h -Zosyn 3.375G IV q8h to be infused over 4 hours -Trend WBC, temp, renal function  -Drug levels as indicated   Abran DukeLedford, Haynes Giannotti 08/17/2015,4:11 AM

## 2015-08-17 NOTE — ED Notes (Signed)
DNR and paperwork faxed tp [facility] by Windy KalataYasemia RN

## 2015-08-17 NOTE — Progress Notes (Signed)
[facility] PROGRESS NOTE    Progress Note   Christine EagleDebra L Kipp RUE:[id number] DOB: [birth year] DOA: 08/16/2015 PCP: Ron ParkerBOWEN,SAMUEL, MD   Brief Narrative:   Christine Pena is an 59 y.o. 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  Assessment/Plan:  Sepsis (HCC) likely due to healthcare associated pneumonia: She was started empirically on IV vancomycin and Zosyn, DC Zosyn started [place]. She has remained afebrile, with no leukocytosis. Her lactic acid was 1.3. Her blood pressure has improved with IV hydration. Her respiration heart rate have improved, blood cultures and urine cultures are pending.  Mental retardation:  Seizure (HCC): Worsening likely due to infectious etiology continue Keppra and phenobarbital. Ativan when necessary for seizures.  Psychotic disorder: Continue current regimen.  Morbid obesity (HCC)  Essential hypertension Continue to hold antihypertensive medication, has her blood pressure is borderline.    DVT Prophylaxis - Lovenox ordered.  Family Communication: None Disposition Plan: Home when stable. Code Status:     Code Status Orders        Start     Ordered   08/17/15 0529  Do not attempt resuscitation (DNR)   Continuous    Question Answer Comment  In the event of cardiac or respiratory ARREST Do not call a "code blue"   In the event of cardiac or respiratory ARREST Do not perform Intubation, CPR, defibrillation or ACLS   In the event of cardiac or respiratory ARREST Use medication by any route, position, wound care, and other measures to relive pain and suffering. May use oxygen, suction and manual treatment of airway obstruction as needed for comfort.      08/17/15 0528        IV Access:    Peripheral IV   Procedures and diagnostic studies:   Dg Chest Portable 1 View  08/17/2015  CLINICAL DATA:   Acute onset of seizure.  Initial encounter. EXAM: PORTABLE CHEST 1 VIEW COMPARISON:  Chest radiograph performed 05/14/2015 FINDINGS: The lungs are mildly hypoexpanded. Mild left midlung opacity may reflect atelectasis or possibly mild pneumonia. There is no evidence of pleural effusion or pneumothorax. The cardiomediastinal silhouette is borderline normal in size. No acute osseous abnormalities are seen. IMPRESSION: Mild left midlung opacity may reflect atelectasis or possibly mild pneumonia, depending on the patient's symptoms. Lungs mildly hypoexpanded. Electronically Signed   By: Roanna RaiderJeffery  Chang M.D.   On: 08/17/2015 00:18     Medical Consultants:    None.  Anti-Infectives:   Anti-infectives    Start     Dose/Rate Route Frequency Ordered Stop   08/17/15 0430  vancomycin (VANCOCIN) IVPB 1000 mg/200 mL premix     1,000 mg 200 mL/hr over 60 Minutes Intravenous Every 12 hours 08/17/15 0416     08/17/15 0430  piperacillin-tazobactam (ZOSYN) IVPB 3.375 g     3.375 g 12.5 mL/hr over 240 Minutes Intravenous 3 times per day 08/17/15 0416        Subjective:    Christine Pena patient is nonverbal breathing comfortably without oxygen.  Objective:    Filed Vitals:   08/17/15 0345 08/17/15 0430 08/17/15 0500 08/17/15 0700  BP: 99/58 111/76 135/69 99/60  Pulse:  97 89 75  Temp:   98.4 F (36.9 C) 98.6 F (37 C)  TempSrc:   Oral Oral  Resp: 22 24 22 22   Weight:   90.175 kg (198 lb 12.8 oz)  SpO2:  98% 96% 98%   No intake or output data in the 24 hours ending 08/17/15 0831 Filed Weights   08/17/15 0500  Weight: 90.175 kg (198 lb 12.8 oz)    Exam: Gen:  NAD, without oxygen breathing comfortably. Cardiovascular:  RRR, No M/R/G Chest and lungs:   CTAB Abdomen:  Abdomen soft, NT/ND, + BS Extremities:  No C/E/C   Data Reviewed:    Labs: Basic Metabolic Panel:  Recent Labs Lab 08/17/15 0025 08/17/15 0555  NA 136 136  K 3.8 3.9  CL 102 102  CO2 23 27  GLUCOSE 141* 95   BUN 16 13  CREATININE 0.60 0.49  CALCIUM 9.5 9.6   GFR Estimated Creatinine Clearance: 83 mL/min (by C-G formula based on Cr of 0.49). Liver Function Tests:  Recent Labs Lab 08/17/15 0025  AST 18  ALT 12*  ALKPHOS 57  BILITOT 0.3  PROT 6.2*  ALBUMIN 3.5   No results for input(s): LIPASE, AMYLASE in the last 168 hours. No results for input(s): AMMONIA in the last 168 hours. Coagulation profile No results for input(s): INR, PROTIME in the last 168 hours.  CBC:  Recent Labs Lab 08/17/15 0025 08/17/15 0555  WBC 6.4 7.3  NEUTROABS 5.2  --   HGB 11.5* 11.5*  HCT 34.4* 34.0*  MCV 85.1 84.8  PLT 340 333   Cardiac Enzymes:  Recent Labs Lab 08/17/15 0025  TROPONINI <0.03   BNP (last 3 results) No results for input(s): PROBNP in the last 8760 hours. CBG: No results for input(s): GLUCAP in the last 168 hours. D-Dimer: No results for input(s): DDIMER in the last 72 hours. Hgb A1c: No results for input(s): HGBA1C in the last 72 hours. Lipid Profile: No results for input(s): CHOL, HDL, LDLCALC, TRIG, CHOLHDL, LDLDIRECT in the last 72 hours. Thyroid function studies: No results for input(s): TSH, T4TOTAL, T3FREE, THYROIDAB in the last 72 hours.  Invalid input(s): FREET3 Anemia work up: No results for input(s): VITAMINB12, FOLATE, FERRITIN, TIBC, IRON, RETICCTPCT in the last 72 hours. Sepsis Labs:  Recent Labs Lab 08/17/15 0025 08/17/15 0232 08/17/15 0555  WBC 6.4  --  7.3  LATICACIDVEN  --  1.37  --    Microbiology No results found for this or any previous visit (from the past 240 hour(s)).   Medications:   . atorvastatin  10 mg Oral Daily  . benztropine  1 mg Oral BID  . clonazePAM  1 mg Oral Daily  . enoxaparin (LOVENOX) injection  40 mg Subcutaneous Q24H  . levETIRAcetam  1,000 mg Oral Q2000  . levETIRAcetam  1,500 mg Oral Daily  . lisinopril  10 mg Oral Daily  . oxybutynin  5 mg Oral Daily  . PHENobarbital  129.6 mg Oral QHS  .  piperacillin-tazobactam (ZOSYN)  IV  3.375 g Intravenous 3 times per day  . QUEtiapine  25 mg Oral Daily  . QUEtiapine  50 mg Oral QHS  . sodium chloride  3 mL Intravenous Q12H  . vancomycin  1,000 mg Intravenous Q12H   Continuous Infusions: . sodium chloride      Time spent: 25 min   LOS: 0 days   Marinda Elk  [facility] Pager [phone]  *Please refer to [email or web address], password [email or web address] to get updated schedule on who will round on this patient, as hospitalists switch teams weekly. If 7PM-7AM, please contact night-coverage at [email or web address], password [email or web address] for any overnight needs.  08/17/2015, 8:31 AM

## 2015-08-17 NOTE — H&P (Signed)
History and Physical  Patient Name: Christine Pena     WUJ:[id number]    DOB: August 31, 1956    DOA: 08/16/2015 Referring physician: Dutch Quint, MD PCP: Ron Parker, MD      Chief Complaint: Seizures at NH  HPI: Christine Pena is a 59 y.o. 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  All history is collected from EDP, as the patient is nonverbal at baseline and there are no records available from her nursing home. The patient was in her usual state of health until today when she was witnessed to have 2 self-limited generalized tonic-clonic seizures at her nursing home.  In the ED, the patient was afebrile but tachycardic and had soft blood pressure. Her urine dipstick showed blood and white blood cells but a microscopic analysis was clear. Lactic acid level is normal. Chest x-ray showed a questionable left opacity and she had sinus tachycardia on ECG.  She also was witnessed to have one generalized tonic-clonic seizure in the ED and was given 1 mg of Ativan. Blood cultures, urine cultures were drawn and the patient was started on vancomycin and Zosyn and TRH were asked to admit for sepsis source unclear.     Review of Systems:  Unable to obtain due to patient mentation   Allergies:  Allergies  Allergen Reactions  . Depakote [Valproic Acid]     altered mental status/ high ammonia  . Haldol [Haloperidol Lactate]     Unknown reaction.    Home medications: 1. Atorvastatin 10 mg daily 2. Benztropine 1 mg twice daily 3. Clonazepam 1 mg daily 4. Furosemide 20 mg twice daily 5. Levetiracetam 1500 mg in the morning and 1000 mg at night 6. Lisinopril 10 mg daily 7. Oxybutynin 5 mg daily 8. Phenobarbital 129.6 mg nightly 9. Quetiapine 25 mg and 50 mg at night 10. Thiothixene 10 mg nightly  Past medical history: 1. MR 2.  Secondary parkinsons 3. Epilepsy 4. HTN  Past surgical history: None on file  Family history:  Unable to obtain due to patient mentation.  Records show heart disease in mother.    Social History:  Patient lives in [facility] nursing [facility].  Her ambulatory status is not clear and she is unable to say, but previous notes mention her wheelchair.      Physical Exam: BP 111/76 mmHg  Pulse 90  Temp(Src) 98.3 F (36.8 C) (Oral)  Resp 24  SpO2 98% General appearance: Obese adult female, lying in bed, responsive to some commands, weak appearing, nonverbal.   Eyes: Anicteric, conjunctiva pink, lids and lashes normal.     ENT: No nasal deformity, discharge, or epistaxis.  OP tacky without lesions.   Skin: Warm and flushed and moist. Cardiac: Tachycardic, regular, nl S1-S2, no murmurs appreciated.  Capillary refill is normal.  Respiratory: Normal respiratory rate and rhythm.  CTAB without rales corresponding to CXR findings. Abdomen: Abdomen soft without rigidity.  No TTP elicited and no guarding. No ascites, distension.   Neuro: Non-verbal.  Follows only simple commands.      Psych: Unable to obtain.       Labs on Admission:  The metabolic panel shows normal sodium, potassium, bicarbonate, and renal function. Transaminases and bilirubin are normal. A random phenobarbital is low. Her troponin is negative. The urinalysis shows blood and white blood cells and casts on dip. The lactate is normal.  The complete blood count shows no leukocytosis.  Chronic normocytic anemia.   Radiological Exams on Admission: Personally reviewed: Dg Chest Portable 1 View  08/17/2015  CLINICAL DATA:  Acute onset of seizure.  Initial encounter. EXAM: PORTABLE CHEST 1 VIEW COMPARISON:  Chest radiograph performed 05/14/2015 FINDINGS: The lungs are mildly hypoexpanded. Mild left midlung opacity may reflect atelectasis or possibly mild pneumonia. There is no evidence of pleural effusion or pneumothorax.  The cardiomediastinal silhouette is borderline normal in size. No acute osseous abnormalities are seen. IMPRESSION: Mild left midlung opacity may reflect atelectasis or possibly mild pneumonia, depending on the patient's symptoms. Lungs mildly hypoexpanded. Electronically Signed   By: Roanna RaiderJeffery  Chang M.D.   On: 08/17/2015 00:18    EKG: Independently reviewed. Sinus tachycardia without ST changes.    Assessment/Plan 1. Sepsis:  This is new.  The patient appears flushed and warm and has tachycardia and low blood pressure, which is promptly fluid responsive.  Suspected source likely urine. Organism unknown. Patient meets criteria given tachycardia, hypotension, and evidence of organ dysfunction.  Blood and urine cultures drawn.   -Vancomycin and piperacillin-tazobactam, renally dosed -Admit to telemetry -IV fluids -Acetaminophen for fever    2. Seizures:  This seems to be worsened in setting of #1 above. -Continue levetiracetam and phenobarbital -Seizure precautions  3. Psychotic disorder:  -Continue thiothixene, quetiapine and benztropine  4. HTN:  -Continue lisinopril -Hold furosemide until BP improved  5. Urinary incontinence:  Stable.  -Continue oxybutynin       DVT PPx: Lovenox Diet: Regular Consultants: None Code Status: DNR  Family Communication: None present  Medical decision making: What exists of the patient's previous chart was reviewed in depth and the case was discussed with Dr. Blinda LeatherwoodPollina. Patient seen 4:52 AM on 08/17/2015.  Disposition Plan:  Admit for IV antibiotics, follow cultures.  Anticipate hospitalization for 3-4 days.      Alberteen SamChristopher P Danford [facility] Pager [phone]

## 2015-08-18 DIAGNOSIS — R569 Unspecified convulsions: Secondary | ICD-10-CM

## 2015-08-18 DIAGNOSIS — I10 Essential (primary) hypertension: Secondary | ICD-10-CM

## 2015-08-18 DIAGNOSIS — A419 Sepsis, unspecified organism: Principal | ICD-10-CM

## 2015-08-18 LAB — URINE CULTURE: Culture: NO GROWTH

## 2015-08-18 LAB — HIV ANTIBODY (ROUTINE TESTING W REFLEX): HIV Screen 4th Generation wRfx: NONREACTIVE

## 2015-08-18 NOTE — Evaluation (Signed)
Clinical/Bedside Swallow Evaluation Patient Details  Name: Gareth EagleDebra L Nembhard MRN: [id number] Date of Birth: 22-Jun-1956  Today's Date: 08/18/2015 Time: SLP Start Time (ACUTE ONLY): 1150 SLP Stop Time (ACUTE ONLY): 1159 SLP Time Calculation (min) (ACUTE ONLY): 9 min  Past Medical History:  Past Medical History  Diagnosis Date  . Hypertension   . Seizures (HCC)   . Mental retardation   . Hyperlipidemia   . Anxiety   . Vitamin D deficiency   . Obesity   . Dementia   . Secondary Parkinson disease (HCC) 05/04/2015   Past Surgical History: History reviewed. No pertinent past surgical history. HPI:  Gareth EagleDebra L Scherzinger is an 59 y.o. 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   Assessment / Plan / Recommendation Clinical Impression  Pt's oropharyngeal swallow appears adequate for safe PO intake, although with mildy prolonged mastication and oral transit with regular textures. Would adjust diet to mechanical soft solids, continuing thin liquids, to facilitate oral preparation. Use of purees and thin liquids also assists with oral clearance. No furhter acute SLP needs identified, will sign off.    Aspiration Risk  Mild aspiration risk    Diet Recommendation  Mechanical soft, thin liquids   Medication Administration: Whole meds with puree    Other  Recommendations Oral Care Recommendations: Oral care BID   Follow up Recommendations  None    Frequency and Duration   n/a         Prognosis   n/a     Swallow Study   General HPI: Gareth EagleDebra L Arntson is an 59 y.o. 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 Type of Study: Bedside Swallow Evaluation Previous Swallow Assessment: none in chart Diet Prior to this Study: Regular;Thin  liquids Temperature Spikes Noted: Yes (99.7) Respiratory Status: Room air History of Recent Intubation: No Behavior/Cognition: Alert;Cooperative;Requires cueing Oral Cavity Assessment: Within Functional Limits Oral Care Completed by SLP: No Oral Cavity - Dentition: Adequate natural dentition Self-Feeding Abilities: Needs assist Patient Positioning: Upright in bed Baseline Vocal Quality: Not observed    Oral/Motor/Sensory Function Overall Oral Motor/Sensory Function: Within functional limits   Ice Chips Ice chips: Not tested   Thin Liquid Thin Liquid: Within functional limits Presentation: Straw    Nectar Thick Nectar Thick Liquid: Not tested   Honey Thick Honey Thick Liquid: Not tested   Puree Puree: Within functional limits Presentation: Spoon   Solid Solid: Impaired Presentation: Spoon Oral Phase Functional Implications: Prolonged oral transit      Maxcine HamLaura Paiewonsky, M.A. CCC-SLP [phone]  Maxcine Hamaiewonsky, Seif Teichert 08/18/2015,12:11 PM

## 2015-08-18 NOTE — Progress Notes (Signed)
[facility] PROGRESS NOTE    Progress Note   Christine EagleDebra L Moen ZOX:[id number] DOB: 10-09-1955 DOA: 08/16/2015 PCP: Ron ParkerBOWEN,SAMUEL, MD   Brief Narrative:   Christine Pena is an 59 y.o. 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  Assessment/Plan:  Sepsis (HCC) likely due to healthcare associated pneumonia: She was started empirically on IV vancomycin and Zosyn, DC Zosyn started [place]. She has remained afebrile, with no leukocytosis. Check a swallowing evaluation. blood cultures and urine cultures are pending.  Mental retardation:  Seizure (HCC): Worsening likely due to infectious etiology continue Keppra and phenobarbital. Ativan when necessary for seizures. She has had no event in last 24 hours.  Psychotic disorder: Continue current regimen.  Morbid obesity (HCC)  Essential hypertension Continue to hold antihypertensive medication, has her blood pressure is borderline.    DVT Prophylaxis - Lovenox ordered.  Family Communication: None Disposition Plan: Home 2 days Code Status:     Code Status Orders        Start     Ordered   08/17/15 0529  Do not attempt resuscitation (DNR)   Continuous    Question Answer Comment  In the event of cardiac or respiratory ARREST Do not call a "code blue"   In the event of cardiac or respiratory ARREST Do not perform Intubation, CPR, defibrillation or ACLS   In the event of cardiac or respiratory ARREST Use medication by any route, position, wound care, and other measures to relive pain and suffering. May use oxygen, suction and manual treatment of airway obstruction as needed for comfort.      08/17/15 0528        IV Access:    Peripheral IV   Procedures and diagnostic studies:   Dg Chest Portable 1 View  08/17/2015  CLINICAL DATA:  Acute onset of seizure.  Initial encounter. EXAM:  PORTABLE CHEST 1 VIEW COMPARISON:  Chest radiograph performed 05/14/2015 FINDINGS: The lungs are mildly hypoexpanded. Mild left midlung opacity may reflect atelectasis or possibly mild pneumonia. There is no evidence of pleural effusion or pneumothorax. The cardiomediastinal silhouette is borderline normal in size. No acute osseous abnormalities are seen. IMPRESSION: Mild left midlung opacity may reflect atelectasis or possibly mild pneumonia, depending on the patient's symptoms. Lungs mildly hypoexpanded. Electronically Signed   By: Roanna RaiderJeffery  Chang M.D.   On: 08/17/2015 00:18     Medical Consultants:    None.  Anti-Infectives:   Anti-infectives    Start     Dose/Rate Route Frequency Ordered Stop   08/17/15 1000  cefTAZidime (FORTAZ) 2 g in dextrose 5 % 50 mL IVPB     2 g 100 mL/hr over 30 Minutes Intravenous Every 8 hours 08/17/15 0835     08/17/15 0430  vancomycin (VANCOCIN) IVPB 1000 mg/200 mL premix     1,000 mg 200 mL/hr over 60 Minutes Intravenous Every 12 hours 08/17/15 0416     08/17/15 0430  piperacillin-tazobactam (ZOSYN) IVPB 3.375 g  Status:  Discontinued     3.375 g 12.5 mL/hr over 240 Minutes Intravenous 3 times per day 08/17/15 0416 08/17/15 0835      Subjective:    Charlann L Gehres patient is nonverbal breathing comfortably without oxygen.  Objective:    Filed Vitals:   08/17/15 1728 08/17/15 2055 08/18/15 0203 08/18/15 0631  BP: 101/55 97/58 102/62 100/58  Pulse: 83 77 61 63  Temp:  98.5 F (36.9 C) 99.7 F (37.6 C) 98.1 F (36.7 C) 98.1 F (36.7 C)  TempSrc: Oral Oral Oral Oral  Resp: Weight:      SpO2: 96% 96% 100% 100%    Intake/Output Summary (Last 24 hours) at 08/18/15 4098 Last data filed at 08/18/15 0203  Gross per 24 hour  Intake    600 ml  Output      4 ml  Net    596 ml   Filed Weights   08/17/15 0500  Weight: 90.175 kg (198 lb 12.8 oz)    Exam: Gen:  NAD, without oxygen breathing comfortably. Cardiovascular:  RRR, No  M/R/G Chest and lungs:   CTAB Abdomen:  Abdomen soft, NT/ND, + BS Extremities:  No C/E/C   Data Reviewed:    Labs: Basic Metabolic Panel:  Recent Labs Lab 08/17/15 0025 08/17/15 0555  NA 136 136  K 3.8 3.9  CL 102 102  CO2 23 27  GLUCOSE 141* 95  BUN 16 13  CREATININE 0.60 0.49  CALCIUM 9.5 9.6   GFR Estimated Creatinine Clearance: 83 mL/min (by C-G formula based on Cr of 0.49). Liver Function Tests:  Recent Labs Lab 08/17/15 0025  AST 18  ALT 12*  ALKPHOS 57  BILITOT 0.3  PROT 6.2*  ALBUMIN 3.5   No results for input(s): LIPASE, AMYLASE in the last 168 hours. No results for input(s): AMMONIA in the last 168 hours. Coagulation profile No results for input(s): INR, PROTIME in the last 168 hours.  CBC:  Recent Labs Lab 08/17/15 0025 08/17/15 0555  WBC 6.4 7.3  NEUTROABS 5.2  --   HGB 11.5* 11.5*  HCT 34.4* 34.0*  MCV 85.1 84.8  PLT 340 333   Cardiac Enzymes:  Recent Labs Lab 08/17/15 0025  TROPONINI <0.03   BNP (last 3 results) No results for input(s): PROBNP in the last 8760 hours. CBG: No results for input(s): GLUCAP in the last 168 hours. D-Dimer: No results for input(s): DDIMER in the last 72 hours. Hgb A1c: No results for input(s): HGBA1C in the last 72 hours. Lipid Profile: No results for input(s): CHOL, HDL, LDLCALC, TRIG, CHOLHDL, LDLDIRECT in the last 72 hours. Thyroid function studies: No results for input(s): TSH, T4TOTAL, T3FREE, THYROIDAB in the last 72 hours.  Invalid input(s): FREET3 Anemia work up: No results for input(s): VITAMINB12, FOLATE, FERRITIN, TIBC, IRON, RETICCTPCT in the last 72 hours. Sepsis Labs:  Recent Labs Lab 08/17/15 0025 08/17/15 0232 08/17/15 0555  WBC 6.4  --  7.3  LATICACIDVEN  --  1.37  --    Microbiology Recent Results (from the past 240 hour(s))  Urine culture     Status: None   Collection Time: 08/17/15  1:09 AM  Result Value Ref Range Status   Specimen Description URINE,  CATHETERIZED  Final   Special Requests NONE  Final   Culture NO GROWTH 1 DAY  Final   Report Status 08/18/2015 FINAL  Final  MRSA PCR Screening     Status: None   Collection Time: 08/17/15  8:09 AM  Result Value Ref Range Status   MRSA by PCR NEGATIVE NEGATIVE Final    Comment:        The GeneXpert MRSA Assay (FDA approved for NASAL specimens only), is one component of a comprehensive MRSA colonization surveillance program. It is not intended to diagnose MRSA infection nor to guide or monitor treatment for MRSA infections.   Culture, blood (routine x 2)  Call MD if unable to obtain prior to antibiotics being given     Status: None (Preliminary result)   Collection Time: 08/17/15  8:56 AM  Result Value Ref Range Status   Specimen Description BLOOD LEFT HAND  Final   Special Requests BOTTLES DRAWN AEROBIC AND ANAEROBIC  5CC  Final   Culture PENDING  Incomplete   Report Status PENDING  Incomplete  Culture, blood (routine x 2) Call MD if unable to obtain prior to antibiotics being given     Status: None (Preliminary result)   Collection Time: 08/17/15  9:01 AM  Result Value Ref Range Status   Specimen Description BLOOD RIGHT HAND  Final   Special Requests BOTTLES DRAWN AEROBIC AND ANAEROBIC  8CC  Final   Culture PENDING  Incomplete   Report Status PENDING  Incomplete     Medications:   . atorvastatin  10 mg Oral Daily  . benztropine  1 mg Oral BID  . cefTAZidime (FORTAZ)  IV  2 g Intravenous Q8H  . clonazePAM  1 mg Oral Daily  . enoxaparin (LOVENOX) injection  40 mg Subcutaneous Q24H  . levETIRAcetam  1,000 mg Oral Q2000  . levETIRAcetam  1,500 mg Oral Daily  . oxybutynin  5 mg Oral Daily  . PHENobarbital  129.6 mg Oral QHS  . QUEtiapine  25 mg Oral Daily  . QUEtiapine  50 mg Oral QHS  . sodium chloride  3 mL Intravenous Q12H  . vancomycin  1,000 mg Intravenous Q12H   Continuous Infusions: . sodium chloride      Time spent: 25 min   LOS: 1 day   Marinda Elk  [facility] Pager [phone]  *Please refer to [email or web address], password [email or web address] to get updated schedule on who will round on this patient, as hospitalists switch teams weekly. If 7PM-7AM, please contact night-coverage at [email or web address], password [email or web address] for any overnight needs.  08/18/2015, 9:22 AM

## 2015-08-19 MED ORDER — LEVETIRACETAM 500 MG PO TABS: 1000.0000 mg | ORAL_TABLET | ORAL | Status: DC

## 2015-08-19 MED ORDER — LEVETIRACETAM 750 MG PO TABS
1500.0000 mg | ORAL_TABLET | Freq: Every day | ORAL | Status: DC
  Administered 2015-08-19 – 2015-08-21 (×3): 1500 mg via ORAL
  Filled 2015-08-19 (×4): qty 2

## 2015-08-19 MED ORDER — LEVETIRACETAM 500 MG PO TABS
1000.0000 mg | ORAL_TABLET | Freq: Every day | ORAL | Status: DC
  Administered 2015-08-20: 1000 mg via ORAL
  Filled 2015-08-19: qty 2

## 2015-08-19 MED ORDER — FUROSEMIDE 20 MG PO TABS
20.0000 mg | ORAL_TABLET | Freq: Two times a day (BID) | ORAL | Status: DC
  Administered 2015-08-19 – 2015-08-21 (×5): 20 mg via ORAL
  Filled 2015-08-19 (×5): qty 1

## 2015-08-19 NOTE — Progress Notes (Signed)
[facility] PROGRESS NOTE    Progress Note   Christine Pena UJW:[id number] DOB: 1956-05-25 DOA: 08/16/2015 PCP: Ron Parker, MD   Brief Narrative:   Christine Pena is an 60 y.o. 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  Assessment/Plan:  Sepsis (HCC) likely due to healthcare associated pneumonia: De-escalate treatment to levaquin. She has remained afebrile, with no leukocytosis. Swallowing evaluation showed no risk of aspiration. blood cultures and urine cultures are negative.  Mental retardation:  Seizure (HCC): Worsening likely due to infectious etiology continue Keppra and phenobarbital. Ativan when necessary for seizures. She has had no event in last 48 hours. D/c telemetry.  Psychotic disorder: Continue current regimen.  Morbid obesity (HCC)  Essential hypertension Continue to hold antihypertensive medication, has her blood pressure is borderline.    DVT Prophylaxis - Lovenox ordered.  Family Communication: None Disposition Plan: Home in am Code Status:     Code Status Orders        Start     Ordered   08/17/15 0529  Do not attempt resuscitation (DNR)   Continuous    Question Answer Comment  In the event of cardiac or respiratory ARREST Do not call a "code blue"   In the event of cardiac or respiratory ARREST Do not perform Intubation, CPR, defibrillation or ACLS   In the event of cardiac or respiratory ARREST Use medication by any route, position, wound care, and other measures to relive pain and suffering. May use oxygen, suction and manual treatment of airway obstruction as needed for comfort.      08/17/15 0528        IV Access:    Peripheral IV   Procedures and diagnostic studies:   No results found.   Medical Consultants:    None.  Anti-Infectives:   Anti-infectives    Start     Dose/Rate  Route Frequency Ordered Stop   08/17/15 1000  cefTAZidime (FORTAZ) 2 g in dextrose 5 % 50 mL IVPB     2 g 100 mL/hr over 30 Minutes Intravenous Every 8 hours 08/17/15 0835     08/17/15 0430  vancomycin (VANCOCIN) IVPB 1000 mg/200 mL premix     1,000 mg 200 mL/hr over 60 Minutes Intravenous Every 12 hours 08/17/15 0416     08/17/15 0430  piperacillin-tazobactam (ZOSYN) IVPB 3.375 g  Status:  Discontinued     3.375 g 12.5 mL/hr over 240 Minutes Intravenous 3 times per day 08/17/15 0416 08/17/15 0835      Subjective:    Christine Pena patient is nonverbal breathing comfortably without oxygen.  Objective:    Filed Vitals:   08/18/15 1744 08/18/15 2221 08/19/15 0101 08/19/15 0516  BP: 106/45 110/50 100/50 121/56  Pulse: 79 81 67 71  Temp: 98 F (36.7 C) 98.1 F (36.7 C) 98.4 F (36.9 C) 97.2 F (36.2 C)  TempSrc: Oral Oral Oral Oral  Resp: Weight:      SpO2: 99% 99% 99% 99%    Intake/Output Summary (Last 24 hours) at 08/19/15 0808 Last data filed at 08/18/15 1800  Gross per 24 hour  Intake    240 ml  Output      0 ml  Net    240 ml   Filed Weights   08/17/15 0500  Weight: 90.175 kg (198 lb 12.8 oz)    Exam: Gen:  NAD, without oxygen breathing comfortably. Cardiovascular:  RRR. Chest and lungs:   Clear to auscultation Abdomen:  Abdomen soft, NT/ND, + BS Extremities:  No C/E/C   Data Reviewed:    Labs: Basic Metabolic Panel:  Recent Labs Lab 08/17/15 0025 08/17/15 0555  NA 136 136  K 3.8 3.9  CL 102 102  CO2 23 27  GLUCOSE 141* 95  BUN 16 13  CREATININE 0.60 0.49  CALCIUM 9.5 9.6   GFR Estimated Creatinine Clearance: 83 mL/min (by C-G formula based on Cr of 0.49). Liver Function Tests:  Recent Labs Lab 08/17/15 0025  AST 18  ALT 12*  ALKPHOS 57  BILITOT 0.3  PROT 6.2*  ALBUMIN 3.5   No results for input(s): LIPASE, AMYLASE in the last 168 hours. No results for input(s): AMMONIA in the last 168 hours. Coagulation  profile No results for input(s): INR, PROTIME in the last 168 hours.  CBC:  Recent Labs Lab 08/17/15 0025 08/17/15 0555  WBC 6.4 7.3  NEUTROABS 5.2  --   HGB 11.5* 11.5*  HCT 34.4* 34.0*  MCV 85.1 84.8  PLT 340 333   Cardiac Enzymes:  Recent Labs Lab 08/17/15 0025  TROPONINI <0.03   BNP (last 3 results) No results for input(s): PROBNP in the last 8760 hours. CBG: No results for input(s): GLUCAP in the last 168 hours. D-Dimer: No results for input(s): DDIMER in the last 72 hours. Hgb A1c: No results for input(s): HGBA1C in the last 72 hours. Lipid Profile: No results for input(s): CHOL, HDL, LDLCALC, TRIG, CHOLHDL, LDLDIRECT in the last 72 hours. Thyroid function studies: No results for input(s): TSH, T4TOTAL, T3FREE, THYROIDAB in the last 72 hours.  Invalid input(s): FREET3 Anemia work up: No results for input(s): VITAMINB12, FOLATE, FERRITIN, TIBC, IRON, RETICCTPCT in the last 72 hours. Sepsis Labs:  Recent Labs Lab 08/17/15 0025 08/17/15 0232 08/17/15 0555  WBC 6.4  --  7.3  LATICACIDVEN  --  1.37  --    Microbiology Recent Results (from the past 240 hour(s))  Urine culture     Status: None   Collection Time: 08/17/15  1:09 AM  Result Value Ref Range Status   Specimen Description URINE, CATHETERIZED  Final   Special Requests NONE  Final   Culture NO GROWTH 1 DAY  Final   Report Status 08/18/2015 FINAL  Final  Culture, blood (routine x 2)     Status: None (Preliminary result)   Collection Time: 08/17/15  2:25 AM  Result Value Ref Range Status   Specimen Description BLOOD LEFT HAND  Final   Special Requests BOTTLES DRAWN AEROBIC AND ANAEROBIC 5CC  Final   Culture NO GROWTH 1 DAY  Final   Report Status PENDING  Incomplete  Culture, blood (routine x 2)     Status: None (Preliminary result)   Collection Time: 08/17/15  2:30 AM  Result Value Ref Range Status   Specimen Description BLOOD LEFT ARM  Final   Special Requests BOTTLES DRAWN AEROBIC AND  ANAEROBIC 5CC  Final   Culture NO GROWTH 1 DAY  Final   Report Status PENDING  Incomplete  MRSA PCR Screening     Status: None   Collection Time: 08/17/15  8:09 AM  Result Value Ref Range Status   MRSA by PCR NEGATIVE NEGATIVE Final    Comment:        The GeneXpert MRSA Assay (FDA approved for NASAL specimens only), is one component of a comprehensive MRSA colonization surveillance program. It  is not intended to diagnose MRSA infection nor to guide or monitor treatment for MRSA infections.   Culture, blood (routine x 2) Call MD if unable to obtain prior to antibiotics being given     Status: None (Preliminary result)   Collection Time: 08/17/15  8:56 AM  Result Value Ref Range Status   Specimen Description BLOOD LEFT HAND  Final   Special Requests BOTTLES DRAWN AEROBIC AND ANAEROBIC  5CC  Final   Culture NO GROWTH 1 DAY  Final   Report Status PENDING  Incomplete  Culture, blood (routine x 2) Call MD if unable to obtain prior to antibiotics being given     Status: None (Preliminary result)   Collection Time: 08/17/15  9:01 AM  Result Value Ref Range Status   Specimen Description BLOOD RIGHT HAND  Final   Special Requests BOTTLES DRAWN AEROBIC AND ANAEROBIC  8CC  Final   Culture NO GROWTH 1 DAY  Final   Report Status PENDING  Incomplete     Medications:   . atorvastatin  10 mg Oral Daily  . benztropine  1 mg Oral BID  . cefTAZidime (FORTAZ)  IV  2 g Intravenous Q8H  . clonazePAM  1 mg Oral Daily  . enoxaparin (LOVENOX) injection  40 mg Subcutaneous Q24H  . levETIRAcetam  1,000 mg Oral Q2000  . levETIRAcetam  1,500 mg Oral Daily  . oxybutynin  5 mg Oral Daily  . PHENobarbital  129.6 mg Oral QHS  . QUEtiapine  25 mg Oral Daily  . QUEtiapine  50 mg Oral QHS  . sodium chloride  3 mL Intravenous Q12H  . vancomycin  1,000 mg Intravenous Q12H   Continuous Infusions:    Time spent: 25 min   LOS: 2 days   Marinda ElkFELIZ ORTIZ, ABRAHAM  [facility] Pager  [phone]  *Please refer to [email or web address], password [email or web address] to get updated schedule on who will round on this patient, as hospitalists switch teams weekly. If 7PM-7AM, please contact night-coverage at [email or web address], password [email or web address] for any overnight needs.  08/19/2015, 8:08 AM

## 2015-08-20 LAB — BASIC METABOLIC PANEL
ANION GAP: 7 (ref 5–15)
BUN: 10 mg/dL (ref 6–20)
CALCIUM: 9.5 mg/dL (ref 8.9–10.3)
CO2: 29 mmol/L (ref 22–32)
Chloride: 103 mmol/L (ref 101–111)
Creatinine, Ser: 0.61 mg/dL (ref 0.44–1.00)
GFR calc Af Amer: >60 mL/min (ref >60)
GLUCOSE: 92 mg/dL (ref 65–99)
POTASSIUM: 3.4 mmol/L — AB (ref 3.5–5.1)
Sodium: 139 mmol/L (ref 135–145)

## 2015-08-20 MED ORDER — CLONAZEPAM 1 MG PO TABS: 1.0000 mg | ORAL_TABLET | Freq: Every day | ORAL | Status: DC

## 2015-08-20 MED ORDER — POTASSIUM CHLORIDE CRYS ER 20 MEQ PO TBCR
40.0000 meq | EXTENDED_RELEASE_TABLET | Freq: Two times a day (BID) | ORAL | Status: AC
  Administered 2015-08-20 (×2): 40 meq via ORAL
  Filled 2015-08-20 (×2): qty 2

## 2015-08-20 MED ORDER — LEVOFLOXACIN 500 MG PO TABS
500.0000 mg | ORAL_TABLET | Freq: Every day | ORAL | Status: DC
  Administered 2015-08-20 – 2015-08-21 (×2): 500 mg via ORAL
  Filled 2015-08-20 (×2): qty 1

## 2015-08-20 MED ORDER — CLONIDINE HCL 0.1 MG PO TABS
0.1000 mg | ORAL_TABLET | Freq: Once | ORAL | Status: AC
  Administered 2015-08-20: 0.1 mg via ORAL
  Filled 2015-08-20: qty 1

## 2015-08-20 NOTE — Progress Notes (Signed)
   08/20/15 0619  Vitals  Temp 98.5 F (36.9 C)  Temp Source Oral  BP (!) 187/96 mmHg  BP Location Right Arm  BP Method Automatic  Patient Position (if appropriate) Lying  Pulse Rate 69  Pulse Rate Source Dinamap  Resp 19  Oxygen Therapy  SpO2 95 %  O2 Device Room [place]  Hospitalist paged.

## 2015-08-20 NOTE — Discharge Summary (Signed)
Physician Discharge Summary  Christine Pena ZOX:[id number] DOB: 28-Sep-1955 DOA: 08/16/2015  PCP: Ron Parker, MD  Admit date: 08/16/2015 Discharge date: 08/21/2015  Time spent:  Recommendations for Outpatient Follow-up:  1. Follow-up with primary care doctor as an outpatient with regular visits. 2. Will go back SNF.   Discharge Diagnoses:  Principal Problem:   Sepsis (HCC) Active Problems:   Mental retardation   Seizure (HCC)   Morbid obesity (HCC)   Essential hypertension   Discharge Condition: stable  Diet recommendation: regular  Filed Weights   08/17/15 0500  Weight: 90.175 kg (198 lb 12.8 oz)    History of present illness:  This-year-old female with past medical history of mental retardation nonverbal seizure disorders on Keppra and phenobarbital comes into the ED for seizure activity.   Hospital Course:  Sepsis likely due to healthcare associated pneumonia: She was started on IV vancomycin and Elita Quick she defervesced her leukocytosis resolved. Her saturations  improve after 2 days of IV antibiotics. Swallowing evaluation was done that showed no risk of aspiration. Cultures remain negative. Antibiotic regimen was de-escalated to Levaquin which she'll continue as an outpatient for 2 additional days.  Seizure activity: Likely worsened due to infectious etiology, control with ativan. After this initial event she had no further episodes of seizure activity.  Psychotic disorders: No changes were made.  Essential hypertension: No changes were made.  Procedures:  CXR  Consultations:  none  Discharge Exam: Filed Vitals:   08/21/15 0245 08/21/15 0551  BP: 114/61 115/72  Pulse: 63 55  Temp: 97.9 F (36.6 C) 98.6 F (37 C)  Resp: 20 20    General: A&O x1 Cardiovascular: RRR Respiratory: good air movement CTA B/L  Discharge Instructions   Discharge Instructions    Diet - low sodium heart healthy    Complete by:  As directed      Increase activity slowly    Complete by:  As directed           Current Discharge Medication List    CONTINUE these medications which have CHANGED   Details  clonazePAM (KLONOPIN) 1 MG tablet Take 1 tablet (1 mg total) by mouth daily. Take 1 mg in the AM and daily as NEEDED Qty: 120 tablet, Refills: 5   Associated Diagnoses: Anxiety state      CONTINUE these medications which have NOT CHANGED   Details  acetaminophen (TYLENOL) 500 MG tablet Take 500 mg by mouth every 4 (four) hours as needed for mild pain or fever.     atorvastatin (LIPITOR) 10 MG tablet Take 10 mg by mouth daily.    benztropine (COGENTIN) 1 MG tablet Take 1 mg by mouth 2 (two) times daily.    chlorhexidine (PERIDEX) 0.12 % solution Use as directed 20 mLs in the mouth or throat 4 (four) times daily.     furosemide (LASIX) 20 MG tablet Take 20 mg by mouth 2 (two) times daily.    hydrocortisone (ANUSOL-HC) 2.5 % rectal cream Place 1 application rectally 2 (two) times daily.    levETIRAcetam (KEPPRA) 1000 MG tablet Take 1 tablet (1,000 mg total) by mouth See admin instructions. Take 1.5 tablet in morning and 1 table in evening. Qty: 60 tablet, Refills: 1    lisinopril (PRINIVIL,ZESTRIL) 10 MG tablet Take 5 mg by mouth daily.     PHENobarbital (LUMINAL) 64.8 MG tablet Take 2 tablets (129.6 mg total) by mouth at bedtime. Qty: 60 tablet, Refills: 0    QUEtiapine (SEROQUEL) 50 MG tablet  Take 0.5-1 tablets (25-50 mg total) by mouth 2 (two) times daily. Take 25 mg in the AM and 50 mg in the PM Qty: 120 tablet, Refills: prn   Associated Diagnoses: Anxiety state    thiothixene (NAVANE) 10 MG capsule Take 10 mg by mouth at bedtime.     Vitamin D, Ergocalciferol, (DRISDOL) 50000 UNITS CAPS capsule Take 50,000 Units by mouth every 7 (seven) days. Takes on Saturdays.    vitamin E (VITAMIN E) 200 UNIT capsule Take 200 Units by mouth daily.       Allergies  Allergen Reactions  . Depakote [Valproic Acid] Other (See  Comments)    altered mental status/ high ammonia  . Haldol [Haloperidol Lactate] Other (See Comments)    Unknown reaction.      The results of significant diagnostics from this hospitalization (including imaging, microbiology, ancillary and laboratory) are listed below for reference.    Significant Diagnostic Studies: Dg Chest Portable 1 View  08/17/2015  CLINICAL DATA:  Acute onset of seizure.  Initial encounter. EXAM: PORTABLE CHEST 1 VIEW COMPARISON:  Chest radiograph performed 05/14/2015 FINDINGS: The lungs are mildly hypoexpanded. Mild left midlung opacity may reflect atelectasis or possibly mild pneumonia. There is no evidence of pleural effusion or pneumothorax. The cardiomediastinal silhouette is borderline normal in size. No acute osseous abnormalities are seen. IMPRESSION: Mild left midlung opacity may reflect atelectasis or possibly mild pneumonia, depending on the patient's symptoms. Lungs mildly hypoexpanded. Electronically Signed   By: Roanna RaiderJeffery  Chang M.D.   On: 08/17/2015 00:18    Microbiology: Recent Results (from the past 240 hour(s))  Urine culture     Status: None   Collection Time: 08/17/15  1:09 AM  Result Value Ref Range Status   Specimen Description URINE, CATHETERIZED  Final   Special Requests NONE  Final   Culture NO GROWTH 1 DAY  Final   Report Status 08/18/2015 FINAL  Final  Culture, blood (routine x 2)     Status: None (Preliminary result)   Collection Time: 08/17/15  2:25 AM  Result Value Ref Range Status   Specimen Description BLOOD LEFT HAND  Final   Special Requests BOTTLES DRAWN AEROBIC AND ANAEROBIC 5CC  Final   Culture NO GROWTH 3 DAYS  Final   Report Status PENDING  Incomplete  Culture, blood (routine x 2)     Status: None (Preliminary result)   Collection Time: 08/17/15  2:30 AM  Result Value Ref Range Status   Specimen Description BLOOD LEFT ARM  Final   Special Requests BOTTLES DRAWN AEROBIC AND ANAEROBIC 5CC  Final   Culture NO GROWTH 3 DAYS   Final   Report Status PENDING  Incomplete  MRSA PCR Screening     Status: None   Collection Time: 08/17/15  8:09 AM  Result Value Ref Range Status   MRSA by PCR NEGATIVE NEGATIVE Final    Comment:        The GeneXpert MRSA Assay (FDA approved for NASAL specimens only), is one component of a comprehensive MRSA colonization surveillance program. It is not intended to diagnose MRSA infection nor to guide or monitor treatment for MRSA infections.   Culture, blood (routine x 2) Call MD if unable to obtain prior to antibiotics being given     Status: None (Preliminary result)   Collection Time: 08/17/15  8:56 AM  Result Value Ref Range Status   Specimen Description BLOOD LEFT HAND  Final   Special Requests BOTTLES DRAWN AEROBIC AND ANAEROBIC  5CC  Final   Culture NO GROWTH 3 DAYS  Final   Report Status PENDING  Incomplete  Culture, blood (routine x 2) Call MD if unable to obtain prior to antibiotics being given     Status: None (Preliminary result)   Collection Time: 08/17/15  9:01 AM  Result Value Ref Range Status   Specimen Description BLOOD RIGHT HAND  Final   Special Requests BOTTLES DRAWN AEROBIC AND ANAEROBIC  8CC  Final   Culture NO GROWTH 3 DAYS  Final   Report Status PENDING  Incomplete     Labs: Basic Metabolic Panel:  Recent Labs Lab 08/17/15 0025 08/17/15 0555 08/20/15 0413  NA 136 136 139  K 3.8 3.9 3.4*  CL 102 102 103  CO2 GLUCOSE 141* 95 92  BUN CREATININE 0.60 0.49 0.61  CALCIUM 9.5 9.6 9.5   Liver Function Tests:  Recent Labs Lab 08/17/15 0025  AST 18  ALT 12*  ALKPHOS 57  BILITOT 0.3  PROT 6.2*  ALBUMIN 3.5   No results for input(s): LIPASE, AMYLASE in the last 168 hours. No results for input(s): AMMONIA in the last 168 hours. CBC:  Recent Labs Lab 08/17/15 0025 08/17/15 0555 08/21/15 0306  WBC 6.4 7.3 5.7  NEUTROABS 5.2  --   --   HGB 11.5* 11.5* 12.1  HCT 34.4* 34.0* 35.0*  MCV 85.1 84.8 85.0  PLT 340 333  130*   Cardiac Enzymes:  Recent Labs Lab 08/17/15 0025  TROPONINI <0.03   BNP: BNP (last 3 results) No results for input(s): BNP in the last 8760 hours.  ProBNP (last 3 results) No results for input(s): PROBNP in the last 8760 hours.  CBG: No results for input(s): GLUCAP in the last 168 hours.     Signed:  Marinda Elk  [facility] 08/21/2015, 8:57 AM

## 2015-08-20 NOTE — Progress Notes (Signed)
[facility] PROGRESS NOTE    Progress Note   Christine Pena WJX:[id number] DOB: [birth year] DOA: 08/16/2015 PCP: Ron Parker, MD   Brief Narrative:   Christine Pena is an 59 y.o. 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  Assessment/Plan:  Sepsis (HCC) likely due to healthcare associated pneumonia: Continue Levaquin, she slightly somnolent today. We'll continue to monitor fever curve check a CBC tomorrow morning. Also continue to monitor her saturations. Swallowing evaluation showed no risk of aspiration. blood cultures and urine cultures are negative.  Mental retardation:  Seizure (HCC): Worsening likely due to infectious etiology continue Keppra and phenobarbital. Ativan when necessary for seizures.   Psychotic disorder: Continue current regimen.  Morbid obesity (HCC)  Essential hypertension Continue to hold antihypertensive medication, has her blood pressure is borderline.    DVT Prophylaxis - Lovenox ordered.  Family Communication: None Disposition Plan: Home in am Code Status:     Code Status Orders        Start     Ordered   08/17/15 0529  Do not attempt resuscitation (DNR)   Continuous    Question Answer Comment  In the event of cardiac or respiratory ARREST Do not call a "code blue"   In the event of cardiac or respiratory ARREST Do not perform Intubation, CPR, defibrillation or ACLS   In the event of cardiac or respiratory ARREST Use medication by any route, position, wound care, and other measures to relive pain and suffering. May use oxygen, suction and manual treatment of airway obstruction as needed for comfort.      08/17/15 0528        IV Access:    Peripheral IV   Procedures and diagnostic studies:   No results found.   Medical Consultants:    None.  Anti-Infectives:   Anti-infectives    Start     Dose/Rate Route Frequency Ordered Stop   08/17/15 1000  cefTAZidime (FORTAZ) 2 g in dextrose 5 % 50 mL IVPB     2 g 100 mL/hr over 30 Minutes Intravenous Every 8 hours 08/17/15 0835     08/17/15 0430  vancomycin (VANCOCIN) IVPB 1000 mg/200 mL premix     1,000 mg 200 mL/hr over 60 Minutes Intravenous Every 12 hours 08/17/15 0416     08/17/15 0430  piperacillin-tazobactam (ZOSYN) IVPB 3.375 g  Status:  Discontinued     3.375 g 12.5 mL/hr over 240 Minutes Intravenous 3 times per day 08/17/15 0416 08/17/15 0835      Subjective:    Christine Pena patient is nonverbal, slightly somnolent today compared to yesterday. Not able to grimace or have any facial expressions.  Objective:    Filed Vitals:   08/19/15 1334 08/19/15 2124 08/20/15 0208 08/20/15 0619  BP: 103/51 100/58 106/52 187/96  Pulse: 75 73 67 69  Temp: 98.3 F (36.8 C) 99.3 F (37.4 C) 99.1 F (37.3 C) 98.5 F (36.9 C)  TempSrc: Oral Oral Axillary Oral  Resp: Weight:      SpO2: 96% 95% 96% 95%    Intake/Output Summary (Last 24 hours) at 08/20/15 0909 Last data filed at 08/20/15 0855  Gross per 24 hour  Intake    120 ml  Output      0 ml  Net    120 ml   Filed Weights   08/17/15 0500  Weight: 90.175 kg (198 lb 12.8 oz)    Exam: Gen:  NAD, without oxygen breathing comfortably. Cardiovascular:  RRR. Chest and lungs:   Clear to auscultation Abdomen:  Abdomen soft, NT/ND, + BS Extremities:  No C/E/C   Data Reviewed:    Labs: Basic Metabolic Panel:  Recent Labs Lab 08/17/15 0025 08/17/15 0555 08/20/15 0413  NA 136 136 139  K 3.8 3.9 3.4*  CL 102 102 103  CO2 23 27 29   GLUCOSE 141* 95 92  BUN 16 13 10   CREATININE 0.60 0.49 0.61  CALCIUM 9.5 9.6 9.5   GFR Estimated Creatinine Clearance: 83 mL/min (by C-G formula based on Cr of 0.61). Liver Function Tests:  Recent Labs Lab 08/17/15 0025  AST 18  ALT 12*  ALKPHOS 57  BILITOT 0.3  PROT 6.2*  ALBUMIN 3.5   No  results for input(s): LIPASE, AMYLASE in the last 168 hours. No results for input(s): AMMONIA in the last 168 hours. Coagulation profile No results for input(s): INR, PROTIME in the last 168 hours.  CBC:  Recent Labs Lab 08/17/15 0025 08/17/15 0555  WBC 6.4 7.3  NEUTROABS 5.2  --   HGB 11.5* 11.5*  HCT 34.4* 34.0*  MCV 85.1 84.8  PLT 340 333   Cardiac Enzymes:  Recent Labs Lab 08/17/15 0025  TROPONINI <0.03   BNP (last 3 results) No results for input(s): PROBNP in the last 8760 hours. CBG: No results for input(s): GLUCAP in the last 168 hours. D-Dimer: No results for input(s): DDIMER in the last 72 hours. Hgb A1c: No results for input(s): HGBA1C in the last 72 hours. Lipid Profile: No results for input(s): CHOL, HDL, LDLCALC, TRIG, CHOLHDL, LDLDIRECT in the last 72 hours. Thyroid function studies: No results for input(s): TSH, T4TOTAL, T3FREE, THYROIDAB in the last 72 hours.  Invalid input(s): FREET3 Anemia work up: No results for input(s): VITAMINB12, FOLATE, FERRITIN, TIBC, IRON, RETICCTPCT in the last 72 hours. Sepsis Labs:  Recent Labs Lab 08/17/15 0025 08/17/15 0232 08/17/15 0555  WBC 6.4  --  7.3  LATICACIDVEN  --  1.37  --    Microbiology Recent Results (from the past 240 hour(s))  Urine culture     Status: None   Collection Time: 08/17/15  1:09 AM  Result Value Ref Range Status   Specimen Description URINE, CATHETERIZED  Final   Special Requests NONE  Final   Culture NO GROWTH 1 DAY  Final   Report Status 08/18/2015 FINAL  Final  Culture, blood (routine x 2)     Status: None (Preliminary result)   Collection Time: 08/17/15  2:25 AM  Result Value Ref Range Status   Specimen Description BLOOD LEFT HAND  Final   Special Requests BOTTLES DRAWN AEROBIC AND ANAEROBIC 5CC  Final   Culture NO GROWTH 2 DAYS  Final   Report Status PENDING  Incomplete  Culture, blood (routine x 2)     Status: None (Preliminary result)   Collection Time: 08/17/15  2:30  AM  Result Value Ref Range Status   Specimen Description BLOOD LEFT ARM  Final   Special Requests BOTTLES DRAWN AEROBIC AND ANAEROBIC 5CC  Final   Culture NO GROWTH 2 DAYS  Final   Report Status PENDING  Incomplete  MRSA PCR Screening     Status: None   Collection Time: 08/17/15  8:09 AM  Result Value Ref Range Status   MRSA by PCR NEGATIVE NEGATIVE Final    Comment:  The GeneXpert MRSA Assay (FDA approved for NASAL specimens only), is one component of a comprehensive MRSA colonization surveillance program. It is not intended to diagnose MRSA infection nor to guide or monitor treatment for MRSA infections.   Culture, blood (routine x 2) Call MD if unable to obtain prior to antibiotics being given     Status: None (Preliminary result)   Collection Time: 08/17/15  8:56 AM  Result Value Ref Range Status   Specimen Description BLOOD LEFT HAND  Final   Special Requests BOTTLES DRAWN AEROBIC AND ANAEROBIC  5CC  Final   Culture NO GROWTH 2 DAYS  Final   Report Status PENDING  Incomplete  Culture, blood (routine x 2) Call MD if unable to obtain prior to antibiotics being given     Status: None (Preliminary result)   Collection Time: 08/17/15  9:01 AM  Result Value Ref Range Status   Specimen Description BLOOD RIGHT HAND  Final   Special Requests BOTTLES DRAWN AEROBIC AND ANAEROBIC  8CC  Final   Culture NO GROWTH 2 DAYS  Final   Report Status PENDING  Incomplete     Medications:   . atorvastatin  10 mg Oral Daily  . benztropine  1 mg Oral BID  . cefTAZidime (FORTAZ)  IV  2 g Intravenous Q8H  . clonazePAM  1 mg Oral Daily  . enoxaparin (LOVENOX) injection  40 mg Subcutaneous Q24H  . furosemide  20 mg Oral BID  . levETIRAcetam  1,000 mg Oral Q2000  . levETIRAcetam  1,500 mg Oral Daily   And  . levETIRAcetam  1,000 mg Oral QHS  . levETIRAcetam  1,500 mg Oral Daily  . oxybutynin  5 mg Oral Daily  . PHENobarbital  129.6 mg Oral QHS  . QUEtiapine  25 mg Oral Daily  .  QUEtiapine  50 mg Oral QHS  . sodium chloride  3 mL Intravenous Q12H  . vancomycin  1,000 mg Intravenous Q12H   Continuous Infusions:    Time spent: 25 min   LOS: 3 days   Marinda Elk  [facility] Pager [phone]  *Please refer to [email or web address], password [email or web address] to get updated schedule on who will round on this patient, as hospitalists switch teams weekly. If 7PM-7AM, please contact night-coverage at [email or web address], password [email or web address] for any overnight needs.  08/20/2015, 9:09 AM

## 2015-08-21 DIAGNOSIS — F79 Unspecified intellectual disabilities: Secondary | ICD-10-CM | POA: Diagnosis not present

## 2015-08-21 DIAGNOSIS — R600 Localized edema: Secondary | ICD-10-CM | POA: Diagnosis not present

## 2015-08-21 DIAGNOSIS — J189 Pneumonia, unspecified organism: Secondary | ICD-10-CM | POA: Diagnosis not present

## 2015-08-21 DIAGNOSIS — F0391 Unspecified dementia with behavioral disturbance: Secondary | ICD-10-CM | POA: Diagnosis not present

## 2015-08-21 DIAGNOSIS — E785 Hyperlipidemia, unspecified: Secondary | ICD-10-CM | POA: Diagnosis not present

## 2015-08-21 DIAGNOSIS — R296 Repeated falls: Secondary | ICD-10-CM | POA: Diagnosis not present

## 2015-08-21 DIAGNOSIS — F209 Schizophrenia, unspecified: Secondary | ICD-10-CM | POA: Diagnosis not present

## 2015-08-21 DIAGNOSIS — R509 Fever, unspecified: Secondary | ICD-10-CM | POA: Diagnosis not present

## 2015-08-21 DIAGNOSIS — R569 Unspecified convulsions: Secondary | ICD-10-CM | POA: Diagnosis not present

## 2015-08-21 DIAGNOSIS — G4089 Other seizures: Secondary | ICD-10-CM | POA: Diagnosis not present

## 2015-08-21 DIAGNOSIS — I10 Essential (primary) hypertension: Secondary | ICD-10-CM | POA: Diagnosis not present

## 2015-08-21 DIAGNOSIS — F29 Unspecified psychosis not due to a substance or known physiological condition: Secondary | ICD-10-CM | POA: Diagnosis not present

## 2015-08-21 DIAGNOSIS — G218 Other secondary parkinsonism: Secondary | ICD-10-CM | POA: Diagnosis not present

## 2015-08-21 DIAGNOSIS — A419 Sepsis, unspecified organism: Secondary | ICD-10-CM | POA: Diagnosis not present

## 2015-08-21 DIAGNOSIS — R350 Frequency of micturition: Secondary | ICD-10-CM | POA: Diagnosis not present

## 2015-08-21 LAB — CBC
HCT: 35 % — ABNORMAL LOW (ref 36.0–46.0)
Hemoglobin: 12.1 g/dL (ref 12.0–15.0)
MCH: 29.4 pg (ref 26.0–34.0)
MCHC: 34.6 g/dL (ref 30.0–36.0)
MCV: 85 fL (ref 78.0–100.0)
Platelets: 130 10*3/uL — ABNORMAL LOW (ref 150–400)
RBC: 4.12 MIL/uL (ref 3.87–5.11)
RDW: 12.6 % (ref 11.5–15.5)
WBC: 5.7 10*3/uL (ref 4.0–10.5)

## 2015-08-21 NOTE — Evaluation (Signed)
Physical Therapy Evaluation Patient Details Name: Christine Pena MRN: [id number] DOB: 1956-02-06 Today's Date: 08/21/2015   History of Present Illness  pt is a 59 y/o female with h/o parkinsons, schizophrenia and disabled developmentally, admitted with seizures and sepsis.  Suspect PNA.  Clinical Impression  Pt admitted with/for seizure and sepsis likely from PNA.  Pt currently limited functionally due to the problems listed. ( See problems list.)   Pt will benefit from PT to maximize function and safety in order to get ready for next venue listed below.     Follow Up Recommendations SNF,  Hoping to get pt quickly back to restorative nursing level.    Equipment Recommendations  None recommended by PT    Recommendations for Other Services       Precautions / Restrictions Precautions Precautions: Fall Restrictions Weight Bearing Restrictions: No      Mobility  Bed Mobility Overal bed mobility: Needs Assistance Bed Mobility: Supine to Sit;Sit to Supine     Supine to sit: Min assist;Mod assist Sit to supine: Min assist;Mod assist   General bed mobility comments: cueas for guidance.  significant boosting up in bed.  Transfers Overall transfer level: Needs assistance Equipment used: Rolling walker (2 wheeled) Transfers: Sit to/from Stand Sit to Stand: Mod assist         General transfer comment: maintained standing for pericare 2-3 min with min guard to min assist.  Ambulation/Gait             General Gait Details: pt refused, not tested  Stairs            Wheelchair Mobility    Modified Rankin (Stroke Patients Only)       Balance Overall balance assessment: Needs assistance Sitting-balance support: No upper extremity supported;Single extremity supported Sitting balance-Leahy Scale: Fair     Standing balance support: Bilateral upper extremity supported Standing balance-Leahy Scale: Poor Standing balance comment: reliant on RW and assist                              Pertinent Vitals/Pain Pain Assessment: Faces Faces Pain Scale: No hurt    Home Living Family/patient expects to be discharged to:: Skilled nursing facility                      Prior Function Level of Independence: Needs assistance   Gait / Transfers Assistance Needed: completely dependent per chart report           Hand Dominance   Dominant Hand: Right    Extremity/Trunk Assessment   Upper Extremity Assessment: Generalized weakness;Overall [facility] for tasks assessed           Lower Extremity Assessment: Generalized weakness;Overall [facility] for tasks assessed      Cervical / Trunk Assessment: Kyphotic  Communication   Communication:  (minimally verbal.)  Cognition Arousal/Alertness: Awake/alert Behavior During Therapy: Flat affect Overall Cognitive Status: History of cognitive impairments - at baseline                      General Comments      Exercises        Assessment/Plan    PT Assessment All further PT needs can be met in the next venue of care  PT Diagnosis Generalized weakness   PT Problem List Decreased strength;Decreased mobility  PT Treatment Interventions     PT Goals (Current goals can be found in  the Care Plan section) Acute Rehab PT Goals PT Goal Formulation: Patient unable to participate in goal setting    Frequency     Barriers to discharge        Co-evaluation               End of Session   Activity Tolerance: Patient tolerated treatment well Patient left: in bed;with call bell/phone within reach;with bed alarm set Nurse Communication: Mobility status         Time: 8242-9980 PT Time Calculation (min) (ACUTE ONLY): 20 min   Charges:   PT Evaluation $Initial PT Evaluation Tier I: 1 Procedure     PT G Codes:        Tequilla Cousineau, Tessie Fass 08/21/2015, 11:37 AM 08/21/2015  Donnella Sham, PT [phone]  (pager)

## 2015-08-21 NOTE — Care Management Note (Signed)
Case Management Note  Patient Details  Name: Christine EagleDebra L Pena MRN: [id number] Date of Birth: 03/27/1956  Subjective/Objective:                    Action/Plan: Patient admitted with seizures and sepsis. [facility] Plan is to discharge [facility] today. No further needs per CM.   Expected Discharge Date:                  Expected Discharge Plan:  Skilled Nursing Facility  In-House Referral:     Discharge planning Services     Post Acute Care Choice:    Choice offered to:     DME Arranged:    DME Agency:     HH Arranged:    HH Agency:     Status of Service:  Completed, signed off  Medicare Important Message Given:  Yes Date Medicare IM Given:    Medicare IM give by:    Date Additional Medicare IM Given:    Additional Medicare Important Message give by:     If discussed at Long Length of Stay Meetings, dates discussed:    Additional Comments:  Kermit BaloKelli F Kerryn Tennant, RN 08/21/2015, 1:50 PM

## 2015-08-21 NOTE — Clinical Social Work Note (Signed)
[facility]. CSW has spoke with pt's brother, [name] who is agreeable to [facility]. CSW attempted to contact pt's mother and HCPOA. CSW unable reach pt's mother at residence ([place]'s [place] at [place] facility representative).   Clinical Social Worker facilitated patient discharge including contacting patient family and facility to confirm patient discharge plans.  Clinical information faxed to facility and family agreeable with plan.  CSW arranged ambulance transport via [facility].  RN to call report prior to discharge.  Clinical Social Worker will sign off for now as social work intervention is no longer needed. Please consult us again if new need arises.  Christine Pena, MSW, LCSWA [phone] 08/21/2015 3:42 PM

## 2015-08-21 NOTE — NC FL2 (Signed)
[place] MEDICAID FL2 LEVEL OF CARE SCREENING TOOL     IDENTIFICATION  Patient Name: Christine Pena Birthdate: Oct 24, 1955 Sex: female Admission Date (Current Location): 08/16/2015  [facility] [place] [id number]   Facility and Address:  [facility], [address], [place], [place] [address]      Provider Number: [id number]  Attending Physician Name and Address:  Marinda ElkAbraham Feliz Ortiz, MD  Relative Name and Phone Number:       Current Level of Care: Hospital Recommended Level of Care: Skilled Nursing Facility Prior Approval Number:    Date Approved/Denied:   PASRR Number: [id number]  Discharge Plan: SNF    Current Diagnoses: Patient Active Problem List   Diagnosis Date Noted  . Sepsis (HCC) 08/17/2015  . UI (urinary incontinence) 07/31/2015  . Vitamin D deficiency 07/31/2015  . Bilateral lower extremity edema 07/31/2015  . Anxiety state 07/31/2015  . Hyperlipidemia 05/15/2015  . 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 Fall   . Hypokalemia 10/10/2014  . Altered mental status 10/08/2014  . Essential hypertension 10/08/2014  . HLD (hyperlipidemia) 10/08/2014  . Morbid obesity (HCC) 05/05/2014  . Seizure (HCC) 05/03/2014  . Hypertension   . Mental retardation 01/06/2014    Orientation ACTIVITIES/SOCIAL BLADDER RESPIRATION    Self  Family supportive Incontinent Normal  BEHAVIORAL SYMPTOMS/MOOD NEUROLOGICAL BOWEL NUTRITION STATUS   (NONE) Convulsions/Seizures Incontinent Diet (DYS 3)  PHYSICIAN VISITS COMMUNICATION OF NEEDS Height & Weight Skin    Verbally 5\' 6"  (167.6 cm) 198 lbs. Normal          AMBULATORY STATUS RESPIRATION    Assist extensive Normal      Personal Care Assistance Level of Assistance  Bathing, Dressing Bathing Assistance: Limited assistance   Dressing Assistance: Limited assistance      Functional Limitations Info   (NONE)             SPECIAL CARE FACTORS FREQUENCY  PT (By  licensed PT)     PT Frequency: 3             Additional Factors Info  Code Status, Allergies Code Status Info: DNR CODE  Allergies Info: Depakote, Haldol           Current Medications (08/21/2015):  This is the current hospital active medication list Current Facility-Administered Medications  Medication Dose Route Frequency Provider Last Rate Last Dose  . acetaminophen (TYLENOL) tablet 650 mg  650 mg Oral Q6H PRN Alberteen Samhristopher P Danford, MD       Or  . acetaminophen (TYLENOL) suppository 650 mg  650 mg Rectal Q6H PRN Alberteen Samhristopher P Danford, MD      . atorvastatin (LIPITOR) tablet 10 mg  10 mg Oral Daily Alberteen Samhristopher P Danford, MD   10 mg at 08/21/15 1039  . benztropine (COGENTIN) tablet 1 mg  1 mg Oral BID Alberteen Samhristopher P Danford, MD   1 mg at 08/21/15 1038  . clonazePAM (KLONOPIN) tablet 1 mg  1 mg Oral Daily Alberteen Samhristopher P Danford, MD   1 mg at 08/21/15 1038  . enoxaparin (LOVENOX) injection 40 mg  40 mg Subcutaneous Q24H Alberteen Samhristopher P Danford, MD   40 mg at 08/20/15 1535  . furosemide (LASIX) tablet 20 mg  20 mg Oral BID Marinda ElkAbraham Feliz Ortiz, MD   20 mg at 08/21/15 0831  . levETIRAcetam (KEPPRA) tablet 1,500 mg  1,500 mg Oral Daily Marinda ElkAbraham Feliz Ortiz, MD   1,500 mg at 08/21/15 1038   And  . levETIRAcetam (KEPPRA)  tablet 1,000 mg  1,000 mg Oral QHS Marinda Elk, MD   1,000 mg at 08/20/15 2210  . levofloxacin (LEVAQUIN) tablet 500 mg  500 mg Oral Daily Marinda Elk, MD   500 mg at 08/21/15 1038  . PHENobarbital (LUMINAL) tablet 129.6 mg  129.6 mg Oral QHS Alberteen Sam, MD   129.6 mg at 08/20/15 2209  . QUEtiapine (SEROQUEL) tablet 25 mg  25 mg Oral Daily Alberteen Sam, MD   25 mg at 08/21/15 1038  . QUEtiapine (SEROQUEL) tablet 50 mg  50 mg Oral QHS Alberteen Sam, MD   50 mg at 08/20/15 2209  . sodium chloride 0.9 % injection 3 mL  3 mL Intravenous Q12H Alberteen Sam, MD   3 mL at 08/20/15 2213     Discharge Medications: Please see  discharge summary for a list of discharge medications.  Relevant Imaging Results:  Relevant Lab Results:  Recent Labs    Additional Information SSN [id number]  Derenda Fennel, MSW, LCSWA [phone] 08/21/2015 11:38 AM

## 2015-08-21 NOTE — Care Management Important Message (Signed)
Important Message  Patient Details  Name: Christine Pena MRN: [id number] Date of Birth: June 24, 1956   Medicare Important Message Given:  Yes    Rudolf Blizard P Jovie Swanner 08/21/2015, 1:29 PM

## 2015-08-21 NOTE — Progress Notes (Signed)
[facility] report called to facility patient is stable for transport.

## 2015-08-22 ENCOUNTER — Non-Acute Institutional Stay (SKILLED_NURSING_FACILITY): Payer: Medicare Other | Admitting: Internal Medicine

## 2015-08-22 ENCOUNTER — Encounter: Payer: Self-pay | Admitting: Internal Medicine

## 2015-08-22 DIAGNOSIS — I10 Essential (primary) hypertension: Secondary | ICD-10-CM

## 2015-08-22 DIAGNOSIS — R600 Localized edema: Secondary | ICD-10-CM | POA: Diagnosis not present

## 2015-08-22 DIAGNOSIS — R569 Unspecified convulsions: Secondary | ICD-10-CM | POA: Diagnosis not present

## 2015-08-22 DIAGNOSIS — F79 Unspecified intellectual disabilities: Secondary | ICD-10-CM | POA: Diagnosis not present

## 2015-08-22 DIAGNOSIS — F411 Generalized anxiety disorder: Secondary | ICD-10-CM | POA: Diagnosis not present

## 2015-08-22 DIAGNOSIS — R509 Fever, unspecified: Secondary | ICD-10-CM | POA: Diagnosis not present

## 2015-08-22 DIAGNOSIS — J189 Pneumonia, unspecified organism: Secondary | ICD-10-CM

## 2015-08-22 LAB — CULTURE, BLOOD (ROUTINE X 2)
CULTURE: NO GROWTH
CULTURE: NO GROWTH
CULTURE: NO GROWTH

## 2015-08-22 NOTE — Progress Notes (Signed)
Patient ID: Christine Pena, female   DOB: 07-07-1956, 59 y.o.   MRN: [id number]    HISTORY AND PHYSICAL   DATE: 08/22/15  Location:  [facility]    Place of Service: SNF [phone])   Extended Emergency Contact Information Primary Emergency Contact: Yoshida,[name] Cleotilde [place] Phone: [phone] Mobile Phone: [phone] Relation: Brother Secondary Emergency Contact: [place] Phone: [phone] Relation: Brother  Advanced Directive information  DNR  Chief Complaint  Patient presents with  . New Admit To SNF    HPI:  59 yo female seen today as a new admission into SNF following hospital stay for pneumonia (HCAP), sepsis and seizures. She was tx with IV vanco and fortaz--->levaquin. Seizures thought to be due to infection and none observed after tx  She continues to spike fevers despite taking levaquin. Tmax 101.3 last night but now 99.64F. Appetite reduced. No N/V. No change in bowel habits. She is a poor historian due to mentally challenged. Hx obtained from nursing and chart  HTN/edema - BP controlled on lisinopril. Edema stable on lasix  Hyperlipidemia - stable on lipitor. No myalgias  Anxiety/dementia/mentally challenged - mood stable on cogentin, klonopin, seroquel and navene  Seizure d/o - stable on phenobarbital and keppra  Past Medical History  Diagnosis Date  . Hypertension   . Seizures ([place])   . Mental retardation   . Hyperlipidemia   . Anxiety   . Vitamin D deficiency   . Obesity   . Dementia   . 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  Patient Care Team: Sande Brothers, MD as PCP - General (Internal Medicine)  Social History   Social History  . Marital Status: Single    Spouse Name: N/A  . Number of Children: 1  . Years of Education: N/A   Occupational History  . Not on file.   Social History Main Topics  . Smoking status:  Never Smoker   . Smokeless tobacco: Never Used  . Alcohol Use: No  . Drug Use: No  . Sexual Activity: Not Currently    Birth Control/ Protection: Post-menopausal   Other Topics Concern  . Not on file   Social History Narrative   Patient drinks about 5 glasses of tea daily.     reports that she has never smoked. She has never used smokeless tobacco. She reports that she does not drink alcohol or use illicit drugs.  Family History  Problem Relation Age of Onset  . Heart disease Mother    Family Status  Relation Status Death Age  . Mother Alive   . Father Deceased   . Brother Alive   . Brother Alive      There is no immunization history on file for this patient.  Allergies  Allergen Reactions  . Depakote [Valproic Acid] Other (See Comments)    altered mental status/ high ammonia  . Haldol [Haloperidol Lactate] Other (See Comments)    Unknown reaction.    Medications: Patient's Medications  New Prescriptions   No medications on file  Previous Medications   ACETAMINOPHEN (TYLENOL) 500 MG TABLET    Take 500 mg by mouth every 4 (four) hours as needed for mild pain or fever.    ATORVASTATIN (LIPITOR) 10 MG TABLET    Take 10 mg by mouth daily.   BENZTROPINE (COGENTIN) 1 MG TABLET    Take 1 mg by mouth 2 (two)  times daily.   CHLORHEXIDINE (PERIDEX) 0.12 % SOLUTION    Use as directed 20 mLs in the mouth or throat 4 (four) times daily.    CLONAZEPAM (KLONOPIN) 1 MG TABLET    Take 1 tablet (1 mg total) by mouth daily. Take 1 mg in the AM and daily as NEEDED   FUROSEMIDE (LASIX) 20 MG TABLET    Take 20 mg by mouth 2 (two) times daily.   HYDROCORTISONE (ANUSOL-HC) 2.5 % RECTAL CREAM    Place 1 application rectally 2 (two) times daily.   LEVETIRACETAM (KEPPRA) 1000 MG TABLET    Take 1 tablet (1,000 mg total) by mouth See admin instructions. Take 1.5 tablet in morning and 1 table in evening.   LISINOPRIL (PRINIVIL,ZESTRIL) 10 MG TABLET    Take 5 mg by mouth daily.    PHENOBARBITAL  (LUMINAL) 64.8 MG TABLET    Take 2 tablets (129.6 mg total) by mouth at bedtime.   QUETIAPINE (SEROQUEL) 50 MG TABLET    Take 0.5-1 tablets (25-50 mg total) by mouth 2 (two) times daily. Take 25 mg in the AM and 50 mg in the PM   THIOTHIXENE (NAVANE) 10 MG CAPSULE    Take 10 mg by mouth at bedtime.    VITAMIN D, ERGOCALCIFEROL, (DRISDOL) 50000 UNITS CAPS CAPSULE    Take 50,000 Units by mouth every 7 (seven) days. Takes on Saturdays.   VITAMIN E (VITAMIN E) 200 UNIT CAPSULE    Take 200 Units by mouth daily.  Modified Medications   No medications on file  Discontinued Medications   No medications on file    Review of Systems  Unable to perform ROS: Psychiatric disorder    Filed Vitals:   08/22/15 1158  BP: 109/54  Pulse: 75  Temp: 101.3 F (38.5 C)  SpO2: 95%   There is no weight on file to calculate BMI.  Physical Exam  Constitutional: She appears well-developed and well-nourished. No distress.  Lying in bed in NAD  HENT:  Mouth/Throat: Oropharynx is clear and moist. No oropharyngeal exudate.  Eyes: Pupils are equal, round, and reactive to light. No scleral icterus.  Neck: Neck supple. Carotid bruit is not present. No tracheal deviation present.  Cardiovascular: Normal rate, regular rhythm, normal heart sounds and intact distal pulses.  Exam reveals no gallop and no friction rub.   No murmur heard. Trace LE edema b/l. no calf TTP.   Pulmonary/Chest: Effort normal. No stridor. No respiratory distress. She has no wheezes. She has rales (left base).  Reduced BS left base> right  Abdominal: Soft. Bowel sounds are normal. She exhibits no distension and no mass. There is no hepatomegaly. There is no tenderness. There is no rebound and no guarding.  Lymphadenopathy:    She has no cervical adenopathy.  Neurological: She is alert.  Skin: Skin is warm and dry. No rash noted.  Psychiatric: She has a normal mood and affect. Her behavior is normal.     Labs reviewed: Admission on  08/16/2015, Discharged on 08/21/2015  Component Date Value Ref Range Status  . WBC 08/17/2015 6.4  4.0 - 10.5 K/uL Final  . RBC 08/17/2015 4.04  3.87 - 5.11 MIL/uL Final  . Hemoglobin 08/17/2015 11.5* 12.0 - 15.0 g/dL Final  . HCT 08/17/2015 34.4* 36.0 - 46.0 % Final  . MCV 08/17/2015 85.1  78.0 - 100.0 fL Final  . MCH 08/17/2015 28.5  26.0 - 34.0 pg Final  . MCHC 08/17/2015 33.4  30.0 - 36.0 g/dL  Final  . RDW 08/17/2015 12.4  11.5 - 15.5 % Final  . Platelets 08/17/2015 340  150 - 400 K/uL Final  . Neutrophils Relative % 08/17/2015 82   Final  . Neutro Abs 08/17/2015 5.2  1.7 - 7.7 K/uL Final  . Lymphocytes Relative 08/17/2015 15   Final  . Lymphs Abs 08/17/2015 1.0  0.7 - 4.0 K/uL Final  . Monocytes Relative 08/17/2015 3   Final  . Monocytes Absolute 08/17/2015 0.2  0.1 - 1.0 K/uL Final  . Eosinophils Relative 08/17/2015 0   Final  . Eosinophils Absolute 08/17/2015 0.0  0.0 - 0.7 K/uL Final  . Basophils Relative 08/17/2015 0   Final  . Basophils Absolute 08/17/2015 0.0  0.0 - 0.1 K/uL Final  . Sodium 08/17/2015 136  135 - 145 mmol/L Final  . Potassium 08/17/2015 3.8  3.5 - 5.1 mmol/L Final  . Chloride 08/17/2015 102  101 - 111 mmol/L Final  . CO2 08/17/2015 23  22 - 32 mmol/L Final  . Glucose, Bld 08/17/2015 141* 65 - 99 mg/dL Final  . BUN 08/17/2015 16  6 - 20 mg/dL Final  . Creatinine, Ser 08/17/2015 0.60  0.44 - 1.00 mg/dL Final  . Calcium 08/17/2015 9.5  8.9 - 10.3 mg/dL Final  . Total Protein 08/17/2015 6.2* 6.5 - 8.1 g/dL Final  . Albumin 08/17/2015 3.5  3.5 - 5.0 g/dL Final  . AST 08/17/2015 18  15 - 41 U/L Final  . ALT 08/17/2015 12* 14 - 54 U/L Final  . Alkaline Phosphatase 08/17/2015 57  38 - 126 U/L Final  . Total Bilirubin 08/17/2015 0.3  0.3 - 1.2 mg/dL Final  . GFR calc non Af Amer 08/17/2015 >60  >60 mL/min Final  . GFR calc Af Amer 08/17/2015 >60  >60 mL/min Final   Comment: (NOTE) The eGFR has been calculated using the CKD EPI equation. This calculation has  not been validated in all clinical situations. eGFR's persistently <60 mL/min signify possible Chronic Kidney Disease.   . Anion gap 08/17/2015 11  5 - 15 Final  . Troponin I 08/17/2015 <0.03  <0.031 ng/mL Final   Comment:        NO INDICATION OF MYOCARDIAL INJURY.   . Color, Urine 08/17/2015 YELLOW  YELLOW Final  . APPearance 08/17/2015 CLEAR  CLEAR Final  . Specific Gravity, Urine 08/17/2015 1.018  1.005 - 1.030 Final  . pH 08/17/2015 5.0  5.0 - 8.0 Final  . Glucose, UA 08/17/2015 100* NEGATIVE mg/dL Final  . Hgb urine dipstick 08/17/2015 TRACE* NEGATIVE Final  . Bilirubin Urine 08/17/2015 NEGATIVE  NEGATIVE Final  . Ketones, ur 08/17/2015 NEGATIVE  NEGATIVE mg/dL Final  . Protein, ur 08/17/2015 NEGATIVE  NEGATIVE mg/dL Final  . Nitrite 08/17/2015 NEGATIVE  NEGATIVE Final  . Leukocytes, UA 08/17/2015 SMALL* NEGATIVE Final  . Specimen Description 08/17/2015 URINE, CATHETERIZED   Final  . Special Requests 08/17/2015 NONE   Final  . Culture 08/17/2015 NO GROWTH 1 DAY   Final  . Report Status 08/17/2015 08/18/2015 FINAL   Final  . Phenobarbital 08/17/2015 13.0* 15.0 - 40.0 ug/mL Final  . Squamous Epithelial / LPF 08/17/2015 0-5* NONE SEEN Final   Please note change in reference range.  . WBC, UA 08/17/2015 0-5  0 - 5 WBC/hpf Final   Please note change in reference range.  . RBC / HPF 08/17/2015 0-5  0 - 5 RBC/hpf Final   Please note change in reference range.  . Bacteria, UA 08/17/2015 NONE  SEEN  NONE SEEN Final   Please note change in reference range.  . Casts 08/17/2015 HYALINE CASTS* NEGATIVE Final  . Lactic Acid, Venous 08/17/2015 1.37  0.5 - 2.0 mmol/L Final  . Specimen Description 08/17/2015 BLOOD LEFT HAND   Final  . Special Requests 08/17/2015 BOTTLES DRAWN AEROBIC AND ANAEROBIC 5CC   Final  . Culture 08/17/2015 NO GROWTH 4 DAYS   Final  . Report Status 08/17/2015 PENDING   Incomplete  . Specimen Description 08/17/2015 BLOOD LEFT ARM   Final  . Special Requests  08/17/2015 BOTTLES DRAWN AEROBIC AND ANAEROBIC 5CC   Final  . Culture  Setup Time 08/17/2015    Final                   Value:ANAEROBIC BOTTLE ONLY GRAM POSITIVE RODS CRITICAL RESULT CALLED TO, READ BACK BY AND VERIFIED WITH: J.STEELMAN,RN [id number] 08/22/15 M.CAMPBELL   . Culture 08/17/2015 CULTURE REINCUBATED FOR BETTER GROWTH   Final  . Report Status 08/17/2015 PENDING   Incomplete  . WBC 08/17/2015 7.3  4.0 - 10.5 K/uL Final  . RBC 08/17/2015 4.01  3.87 - 5.11 MIL/uL Final  . Hemoglobin 08/17/2015 11.5* 12.0 - 15.0 g/dL Final  . HCT 08/17/2015 34.0* 36.0 - 46.0 % Final  . MCV 08/17/2015 84.8  78.0 - 100.0 fL Final  . MCH 08/17/2015 28.7  26.0 - 34.0 pg Final  . MCHC 08/17/2015 33.8  30.0 - 36.0 g/dL Final  . RDW 08/17/2015 12.4  11.5 - 15.5 % Final  . Platelets 08/17/2015 333  150 - 400 K/uL Final  . Sodium 08/17/2015 136  135 - 145 mmol/L Final  . Potassium 08/17/2015 3.9  3.5 - 5.1 mmol/L Final  . Chloride 08/17/2015 102  101 - 111 mmol/L Final  . CO2 08/17/2015 27  22 - 32 mmol/L Final  . Glucose, Bld 08/17/2015 95  65 - 99 mg/dL Final  . BUN 08/17/2015 13  6 - 20 mg/dL Final  . Creatinine, Ser 08/17/2015 0.49  0.44 - 1.00 mg/dL Final  . Calcium 08/17/2015 9.6  8.9 - 10.3 mg/dL Final  . GFR calc non Af Amer 08/17/2015 >60  >60 mL/min Final  . GFR calc Af Amer 08/17/2015 >60  >60 mL/min Final   Comment: (NOTE) The eGFR has been calculated using the CKD EPI equation. This calculation has not been validated in all clinical situations. eGFR's persistently <60 mL/min signify possible Chronic Kidney Disease.   . Anion gap 08/17/2015 7  5 - 15 Final  . MRSA by PCR 08/17/2015 NEGATIVE  NEGATIVE Final   Comment:        The GeneXpert MRSA Assay (FDA approved for NASAL specimens only), is one component of a comprehensive MRSA colonization surveillance program. It is not intended to diagnose MRSA infection nor to guide or monitor treatment for MRSA infections.   Marland Kitchen Specimen  Description 08/17/2015 BLOOD LEFT HAND   Final  . Special Requests 08/17/2015 BOTTLES DRAWN AEROBIC AND ANAEROBIC  5CC   Final  . Culture 08/17/2015 NO GROWTH 4 DAYS   Final  . Report Status 08/17/2015 PENDING   Incomplete  . Specimen Description 08/17/2015 BLOOD RIGHT HAND   Final  . Special Requests 08/17/2015 BOTTLES DRAWN AEROBIC AND ANAEROBIC  [place]   Final  . Culture 08/17/2015 NO GROWTH 4 DAYS   Final  . Report Status 08/17/2015 PENDING   Incomplete  . HIV Screen 4th Generation wRfx 08/17/2015 Non Reactive  Non Reactive Final  Comment: (NOTE) Performed At: [facility] [place], [place] [id number] Lindon Romp MD HU:[id number]   . Sodium 08/20/2015 139  135 - 145 mmol/L Final  . Potassium 08/20/2015 3.4* 3.5 - 5.1 mmol/L Final  . Chloride 08/20/2015 103  101 - 111 mmol/L Final  . CO2 08/20/2015 29  22 - 32 mmol/L Final  . Glucose, Bld 08/20/2015 92  65 - 99 mg/dL Final  . BUN 08/20/2015 10  6 - 20 mg/dL Final  . Creatinine, Ser 08/20/2015 0.61  0.44 - 1.00 mg/dL Final  . Calcium 08/20/2015 9.5  8.9 - 10.3 mg/dL Final  . GFR calc non Af Amer 08/20/2015 >60  >60 mL/min Final  . GFR calc Af Amer 08/20/2015 >60  >60 mL/min Final   Comment: (NOTE) The eGFR has been calculated using the CKD EPI equation. This calculation has not been validated in all clinical situations. eGFR's persistently <60 mL/min signify possible Chronic Kidney Disease.   . Anion gap 08/20/2015 7  5 - 15 Final  . WBC 08/21/2015 5.7  4.0 - 10.5 K/uL Final  . RBC 08/21/2015 4.12  3.87 - 5.11 MIL/uL Final  . Hemoglobin 08/21/2015 12.1  12.0 - 15.0 g/dL Final  . HCT 08/21/2015 35.0* 36.0 - 46.0 % Final  . MCV 08/21/2015 85.0  78.0 - 100.0 fL Final  . MCH 08/21/2015 29.4  26.0 - 34.0 pg Final  . MCHC 08/21/2015 34.6  30.0 - 36.0 g/dL Final  . RDW 08/21/2015 12.6  11.5 - 15.5 % Final  . Platelets 08/21/2015 130* 150 - 400 K/uL Final    Dg Chest Portable 1 View  08/17/2015  CLINICAL  DATA:  Acute onset of seizure.  Initial encounter. EXAM: PORTABLE CHEST 1 VIEW COMPARISON:  Chest radiograph performed 05/14/2015 FINDINGS: The lungs are mildly hypoexpanded. Mild left midlung opacity may reflect atelectasis or possibly mild pneumonia. There is no evidence of pleural effusion or pneumothorax. The cardiomediastinal silhouette is borderline normal in size. No acute osseous abnormalities are seen. IMPRESSION: Mild left midlung opacity may reflect atelectasis or possibly mild pneumonia, depending on the patient's symptoms. Lungs mildly hypoexpanded. Electronically Signed   By: Garald Balding M.D.   On: 08/17/2015 00:18     Assessment/Plan   ICD-9-CM ICD-10-CM   1. Fever, unspecified - probably due to #2 780.60 R50.9   2. HCAP (healthcare-associated pneumonia) 12 J18.9   3. Seizure ([place]) - stable 780.39 R56.9   4. Essential hypertension - stable 401.9 I10   5. Bilateral lower extremity edema - stable 782.3 R60.0   6. Anxiety state - stable 300.00 F41.1   7. Intellectual disability 319 F79     Cont levaquin x 7 days more  Repeat CXR in 1 week  Cont other meds as ordered  PT/OT as ordered  GOAL: short term rehab and d/c home when medically appropriate. Communicated with pt and nursing.  Will follow  Deborha Moseley S. Perlie Gold  [facility] [address] [place], [place] [address] [phone] Cell (Monday-Friday 8 AM - 5 PM) [phone] After 5 PM and follow prompts

## 2015-08-23 LAB — CULTURE, BLOOD (ROUTINE X 2)

## 2015-08-29 DIAGNOSIS — J189 Pneumonia, unspecified organism: Secondary | ICD-10-CM | POA: Diagnosis not present

## 2015-09-04 ENCOUNTER — Ambulatory Visit: Payer: Self-pay | Admitting: Adult Health

## 2015-09-05 ENCOUNTER — Encounter: Payer: Self-pay | Admitting: Adult Health

## 2015-09-08 ENCOUNTER — Non-Acute Institutional Stay (SKILLED_NURSING_FACILITY): Payer: Medicare Other | Admitting: Adult Health

## 2015-09-08 DIAGNOSIS — L22 Diaper dermatitis: Secondary | ICD-10-CM | POA: Diagnosis not present

## 2015-09-08 DIAGNOSIS — B372 Candidiasis of skin and nail: Secondary | ICD-10-CM | POA: Diagnosis not present

## 2015-09-14 ENCOUNTER — Non-Acute Institutional Stay (SKILLED_NURSING_FACILITY): Payer: Medicare Other | Admitting: Adult Health

## 2015-09-14 DIAGNOSIS — E559 Vitamin D deficiency, unspecified: Secondary | ICD-10-CM | POA: Diagnosis not present

## 2015-09-14 DIAGNOSIS — G2119 Other drug induced secondary parkinsonism: Secondary | ICD-10-CM | POA: Diagnosis not present

## 2015-09-14 DIAGNOSIS — E785 Hyperlipidemia, unspecified: Secondary | ICD-10-CM | POA: Diagnosis not present

## 2015-09-14 DIAGNOSIS — R569 Unspecified convulsions: Secondary | ICD-10-CM

## 2015-09-14 DIAGNOSIS — I10 Essential (primary) hypertension: Secondary | ICD-10-CM | POA: Diagnosis not present

## 2015-09-14 DIAGNOSIS — E876 Hypokalemia: Secondary | ICD-10-CM

## 2015-09-14 DIAGNOSIS — R600 Localized edema: Secondary | ICD-10-CM | POA: Diagnosis not present

## 2015-09-18 ENCOUNTER — Encounter: Payer: Self-pay | Admitting: Adult Health

## 2015-09-18 NOTE — Progress Notes (Signed)
Patient ID: Christine EagleDebra L Pena, female   DOB: [birth year], 59 y.o.   MRN: [id number]    [facility]      Allergies  Allergen Reactions  . Depakote [Valproic Acid] Other (See Comments)    altered mental status/ high ammonia  . Haldol [Haloperidol Lactate] Other (See Comments)    Unknown reaction.    Chief Complaint  Patient presents with  . Medical Management of Chronic Issues    HPI:  She is a long term resident of this facility being seen for the management of her chronic illnesses. Overall there is little change in her status. She is unable to participate in the hpi or ros. There are no nursing concerns at this time.    Past Medical History  Diagnosis Date  . Hypertension   . Seizures ([facility])   . Mental retardation   . Hyperlipidemia   . Anxiety   . Vitamin D deficiency   . Obesity   . Dementia   . 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 No pertinent past surgical history.  VITAL SIGNS BP 100/56 mmHg  Pulse 91  Ht 5\' 5"  (1.651 m)  Wt 201 lb 14.4 oz (91.581 kg)  BMI 33.60 kg/m2  Patient's Medications  New Prescriptions   No medications on file  Previous Medications   Tylenol 500 mg  500 mg every 6 hours as neded   anusol cream 2.5% Apply twice daily    lipitor 10 mg  Take 10 mg daily    Benztropine mesylate 1 mg  Take 1 mg nightly    Ditropan xl Take 5 mg daily    Vitamin d  50,000 units weekly   keppra 1000 mg Take 1500 mg in the PM and 1000 mg in the AM   Klonopin 1 mg  Take 1 mg twice daily    Lasix 20 mg  Take 20 mg twice daily    Lisinopril 10 mg  Take 10 mg daily    Phenobarbital 64.8 mg Take 2 tabs nightly    seroquel  Take 25 mg in the AM and 50 mg in the PM   Thiothixene 10 mg  Take 10 mg nightly    Vitamin E  200 units daily   Modified Medications   No medications on file  Discontinued Medications   No medications on file     SIGNIFICANT DIAGNOSTIC EXAMS   05-14-15: chest x-ray: 1. No evidence of acute  traumatic injury allowing for low lung volumes and rotation. 2. Mild vascular congestion and bibasilar atelectasis.  05-14-15: ct of head and cervical spine: 1. No acute intracranial abnormality. Cerebellar atrophy is unchanged. 2. No acute fracture or subluxation in the cervical spine.    LABS REVIEWED:   05-14-15: wbc 8.1; hgb 12.9; hct 37.2; mcv 86.9; plt 285; glucose 116; bun 11; creat 1.24; k+ 3.5; na++144 05-15-15: wbc 6.9; hgb 11.7; hct 34.;3 mcv 86.8; plt 251; glucose 104; bun 8; creat 0.51; k+ 3.3; na++144; phenobarb 11.9  05-25-15: phenobarb 16.6  06-16-15: chol 146; ldl 79; trig 58; hdl 55; hgb a1c 5.3; vit d 11     Review of Systems  Unable to perform ROS: Dementia      Physical Exam  Constitutional: No distress.  Obese   Eyes: Conjunctivae are normal.  Neck: Neck supple. No JVD present. No thyromegaly present.  Cardiovascular: Normal rate, regular rhythm and intact distal pulses.   Respiratory: Effort normal and breath sounds normal. No respiratory  distress. She has no wheezes.  GI: Soft. Bowel sounds are normal. She exhibits no distension. There is no tenderness.  Musculoskeletal: She exhibits no edema.  Able to move all extremities   Lymphadenopathy:    She has no cervical adenopathy.  Neurological: She is alert.  Skin: Skin is warm and dry. She is not diaphoretic.  Psychiatric: She has a normal mood and affect.       ASSESSMENT/ PLAN:  1. Seizures: no reports of recent seizure activity; will continue keppra 1 gm in the AM and 1500 mg in the PM will continue phenobarbital 129.6 mg nightly her phenobarb level is 16.6   2. Hypertension: will lower lisinopril to 5 mg daily   3. Dyslipidemia: will continue lipitor 10 mg daily ldl is 79   4. Vit d deficiency: will continue vit d 50,000 units weekly   5. Lower extremity edema: will continue lasix 20 mg twice daily   6. UI: will stop ditropan as this medication is more than likely not providing her with  benefits.    7. Hemorrhoids: will continue anusol twice daily   8. Psychosis: is without change will continue seroquel 25 mg in the AM and  50 mg nightly and will continue navane 10 mg nightly   9. Secondary parkinson's disease: will continue cogentin 1 mg nightly   10. Anxiety: will continue klonopin 1 mg twice daily             Synthia Innocent NP [facility]  Contact [phone] Monday through Friday 8am- 5pm  After hours call [phone]

## 2015-09-23 ENCOUNTER — Encounter: Payer: Self-pay | Admitting: Adult Health

## 2015-09-23 NOTE — Progress Notes (Signed)
Patient ID: Christine EagleDebra L Pena, female   DOB: [birth year], 59 y.o.   MRN: [id number]    [facility]      Allergies  Allergen Reactions  . Depakote [Valproic Acid] Other (See Comments)    altered mental status/ high ammonia  . Haldol [Haloperidol Lactate] Other (See Comments)    Unknown reaction.    Chief Complaint  Patient presents with  . Acute Visit    perineal rah     HPI:  Staff reports that she has a red inflamed perineal rash which is yeast in nature. The topical treatment has not been effective. She is scratching her skin open.   Past Medical History  Diagnosis Date  . Hypertension   . Seizures ([facility])   . Mental retardation   . Hyperlipidemia   . Anxiety   . Vitamin D deficiency   . Obesity   . Dementia   . 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  VITAL SIGNS BP 121/62 mmHg  Pulse 74  Ht 5\' 5"  (1.651 m)  Wt 187 lb (84.823 kg)  BMI 31.12 kg/m2  SpO2 98%  Patient's Medications  New Prescriptions   No medications on file  Previous Medications   ACETAMINOPHEN (TYLENOL) 500 MG TABLET    Take 500 mg by mouth every 4 (four) hours as needed for mild pain or fever.    ATORVASTATIN (LIPITOR) 10 MG TABLET    Take 10 mg by mouth daily.   BENZTROPINE (COGENTIN) 1 MG TABLET    Take 1 mg by mouth 2 (two) times daily.   CHLORHEXIDINE (PERIDEX) 0.12 % SOLUTION    Use as directed 20 mLs in the mouth or throat 4 (four) times daily.    CLONAZEPAM (KLONOPIN) 1 MG TABLET    Take 1 tablet (1 mg total) by mouth daily. Take 1 mg in the AM and daily as NEEDED   FUROSEMIDE (LASIX) 20 MG TABLET    Take 20 mg by mouth 2 (two) times daily.   HYDROCORTISONE (ANUSOL-HC) 2.5 % RECTAL CREAM    Place 1 application rectally 2 (two) times daily.   LEVETIRACETAM (KEPPRA) 1000 MG TABLET    Take 1 tablet (1,000 mg total) by mouth See admin instructions. Take 1.5 tablet in morning and 1 table in evening.   LISINOPRIL (PRINIVIL,ZESTRIL) 10 MG  TABLET    Take 5 mg by mouth daily.    PHENOBARBITAL (LUMINAL) 64.8 MG TABLET    Take 2 tablets (129.6 mg total) by mouth at bedtime.   QUETIAPINE (SEROQUEL) 50 MG TABLET    Take 0.5-1 tablets (25-50 mg total) by mouth 2 (two) times daily. Take 25 mg in the AM and 50 mg in the PM   THIOTHIXENE (NAVANE) 10 MG CAPSULE    Take 10 mg by mouth at bedtime.    VITAMIN D, ERGOCALCIFEROL, (DRISDOL) 50000 UNITS CAPS CAPSULE    Take 50,000 Units by mouth every 7 (seven) days. Takes on Saturdays.   VITAMIN E (VITAMIN E) 200 UNIT CAPSULE    Take 200 Units by mouth daily.  Modified Medications   No medications on file  Discontinued Medications   No medications on file     SIGNIFICANT DIAGNOSTIC EXAMS   05-14-15: chest x-ray: 1. No evidence of acute traumatic injury allowing for low lung volumes and rotation. 2. Mild vascular congestion and bibasilar atelectasis.  05-14-15: ct of head and cervical spine: 1. No acute intracranial abnormality. Cerebellar atrophy is  unchanged. 2. No acute fracture or subluxation in the cervical spine.    LABS REVIEWED:   05-14-15: wbc 8.1; hgb 12.9; hct 37.2; mcv 86.9; plt 285; glucose 116; bun 11; creat 1.24; k+ 3.5; na++144 05-15-15: wbc 6.9; hgb 11.7; hct 34.;3 mcv 86.8; plt 251; glucose 104; bun 8; creat 0.51; k+ 3.3; na++144; phenobarb 11.9  05-25-15: phenobarb 16.6  06-16-15: chol 146; ldl 79; trig 58; hdl 55; hgb a1c 5.3; vit d 11     Review of Systems  Unable to perform ROS: Dementia      Physical Exam  Constitutional: No distress.  Obese   Eyes: Conjunctivae are normal.  Neck: Neck supple. No JVD present. No thyromegaly present.  Cardiovascular: Normal rate, regular rhythm and intact distal pulses.   Respiratory: Effort normal and breath sounds normal. No respiratory distress. She has no wheezes.  GI: Soft. Bowel sounds are normal. She exhibits no distension. There is no tenderness.  Musculoskeletal: She exhibits no edema.  Able to move all  extremities   Lymphadenopathy:    She has no cervical adenopathy.  Neurological: She is alert.  Skin: Skin is warm and dry. She is not diaphoretic.  Perineal redness inflamed yeast infection  Psychiatric: She has a normal mood and affect.      ASSESSMENT/ PLAN:   1. Yeast infection on skin: will begin diflucan 150 mg daily for 3 days and will monitor       Synthia Innocent NP [facility]  Contact [phone] Monday through Friday 8am- 5pm  After hours call [phone]

## 2015-09-23 NOTE — Progress Notes (Signed)
Patient ID: Christine Pena, female   DOB: 04-09-1956, 59 y.o.   MRN: [id number]    Facility: [name]      Allergies  Allergen Reactions  . Depakote [Valproic Acid] Other (See Comments)    altered mental status/ high ammonia  . Haldol [Haloperidol Lactate] Other (See Comments)    Unknown reaction.    Chief Complaint  Patient presents with  . Medical Management of Chronic Issues    HPI:  She is a long term resident of this facility being seen for the management of her chronic illnesses. Overall there is little change in her status. She is unable to fully participate in the hpi or ros. Her rash has resolved. There are no nursing concerns at this time.    Past Medical History  Diagnosis Date  . Hypertension   . Seizures (HCC)   . Mental retardation   . Hyperlipidemia   . Anxiety   . Vitamin D deficiency   . Obesity   . Dementia   . 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  VITAL SIGNS BP 111/63 mmHg  Pulse 73  Ht  (1.651 m)  Wt 189 lb (85.73 kg)  BMI 31.45 kg/m2  SpO2 98%  Patient's Medications  New Prescriptions   No medications on file  Previous Medications   ACETAMINOPHEN (TYLENOL) 500 MG TABLET    Take 500 mg by mouth every 4 (four) hours as needed for mild pain or fever.    ATORVASTATIN (LIPITOR) 10 MG TABLET    Take 10 mg by mouth daily.   BENZTROPINE (COGENTIN) 1 MG TABLET    Take 1 mg by mouth 2 (two) times daily.   CHLORHEXIDINE (PERIDEX) 0.12 % SOLUTION    Use as directed 20 mLs in the mouth or throat 4 (four) times daily.    CLONAZEPAM (KLONOPIN) 1 MG TABLET    Take 1 tablet (1 mg total) by mouth daily. Take 1 mg in the AM and daily as NEEDED   FUROSEMIDE (LASIX) 20 MG TABLET    Take 20 mg by mouth 2 (two) times daily.   HYDROCORTISONE (ANUSOL-HC) 2.5 % RECTAL CREAM    Place 1 application rectally 2 (two) times daily.   LEVETIRACETAM (KEPPRA) 1000 MG TABLET    Take 1 tablet (1,000 mg total) by mouth  See admin instructions. Take 1.5 tablet in morning and 1 table in evening.   LISINOPRIL (PRINIVIL,ZESTRIL) 10 MG TABLET    Take 5 mg by mouth daily.    PHENOBARBITAL (LUMINAL) 64.8 MG TABLET    Take 2 tablets (129.6 mg total) by mouth at bedtime.   QUETIAPINE (SEROQUEL) 50 MG TABLET    Take 0.5-1 tablets (25-50 mg total) by mouth 2 (two) times daily. Take 25 mg in the AM and 50 mg in the PM   THIOTHIXENE (NAVANE) 10 MG CAPSULE    Take 10 mg by mouth at bedtime.    VITAMIN D, ERGOCALCIFEROL, (DRISDOL) 50000 UNITS CAPS CAPSULE    Take 50,000 Units by mouth every 7 (seven) days. Takes on Saturdays.   VITAMIN E (VITAMIN E) 200 UNIT CAPSULE    Take 200 Units by mouth daily.  Modified Medications   No medications on file  Discontinued Medications   No medications on file     SIGNIFICANT DIAGNOSTIC EXAMS   05-14-15: chest x-ray: 1. No evidence of acute traumatic injury allowing for low lung volumes and rotation. 2. Mild vascular congestion  and bibasilar atelectasis.  05-14-15: ct of head and cervical spine: 1. No acute intracranial abnormality. Cerebellar atrophy is unchanged. 2. No acute fracture or subluxation in the cervical spine.  08-17-15: chest x-ray: Mild left midlung opacity may reflect atelectasis or possibly mild pneumonia, depending on the patient's symptoms. Lungs mildly hypoexpanded  08-29-15: chest x-ray: no acute cardiopulmonary disease      LABS REVIEWED:   05-14-15: wbc 8.1; hgb 12.9; hct 37.2; mcv 86.9; plt 285; glucose 116; bun 11; creat 1.24; k+ 3.5; na++144 05-15-15: wbc 6.9; hgb 11.7; hct 34.;3 mcv 86.8; plt 251; glucose 104; bun 8; creat 0.51; k+ 3.3; na++144; phenobarb 11.9  05-25-15: phenobarb 16.6  06-16-15: chol 146; ldl 79; trig 58; hdl 55; hgb a1c 5.3; vit d 11  08-17-15: wbc 6.4; hgb 11.5; hct 34.;4 mcv 85.1; plt 340; glucose 141; bun 16; creat 0.60; k+ 3.8; na++136; liver normal albumin 3.5; phenobarb 13.0; HIV: nr; urine culture: no growth;  blood culture: no  growth  08-20-15: glucose 92; bun 10; creat 0.61; k+ 3.4; na++139 08-21-15: wbc 5.2;hgb 12.1; hct 35.0; mcv 85.0; plt 130     Review of Systems  Unable to perform ROS: Dementia      Physical Exam  Constitutional: No distress.  Obese   Eyes: Conjunctivae are normal.  Neck: Neck supple. No JVD present. No thyromegaly present.  Cardiovascular: Normal rate, regular rhythm and intact distal pulses.   Respiratory: Effort normal and breath sounds normal. No respiratory distress. She has no wheezes.  GI: Soft. Bowel sounds are normal. She exhibits no distension. There is no tenderness.  Musculoskeletal: She exhibits no edema.  Able to move all extremities   Lymphadenopathy:    She has no cervical adenopathy.  Neurological: She is alert.  Skin: Skin is warm and dry. She is not diaphoretic.  Psychiatric: She has a normal mood and affect.       ASSESSMENT/ PLAN:  1. Seizures: no reports of recent seizure activity; will continue keppra 1 gm in the AM and 1500 mg in the PM will continue phenobarbital 129.6 mg nightly her phenobarb level is 13.0   2. Hypertension: will lower lisinopril to 5 mg daily   3. Dyslipidemia: will continue lipitor 10 mg daily ldl is 79   4. Vit d deficiency: will continue vit d 50,000 units weekly   5. Lower extremity edema: will continue lasix 20 mg twice daily   6. UI: is presently not on medications     7. Hemorrhoids: will continue anusol twice daily   8. Psychosis: is without change will continue seroquel 25 mg in the AM and  50 mg nightly and will continue navane 10 mg nightly   9. Secondary parkinson's disease: will continue cogentin 1 mg nightly   10. Anxiety: will continue klonopin 1 mg twice daily          Synthia Innocenteborah Jeyson Deshotel NP [facility]  Contact [phone] Monday through Friday 8am- 5pm  After hours call [phone]

## 2015-10-16 ENCOUNTER — Non-Acute Institutional Stay (SKILLED_NURSING_FACILITY): Payer: Medicare Other | Admitting: Adult Health

## 2015-10-16 DIAGNOSIS — R32 Unspecified urinary incontinence: Secondary | ICD-10-CM

## 2015-10-16 DIAGNOSIS — I10 Essential (primary) hypertension: Secondary | ICD-10-CM

## 2015-10-16 DIAGNOSIS — E785 Hyperlipidemia, unspecified: Secondary | ICD-10-CM | POA: Diagnosis not present

## 2015-10-16 DIAGNOSIS — G2119 Other drug induced secondary parkinsonism: Secondary | ICD-10-CM | POA: Diagnosis not present

## 2015-10-16 DIAGNOSIS — R569 Unspecified convulsions: Secondary | ICD-10-CM | POA: Diagnosis not present

## 2015-11-06 LAB — HM DIABETES FOOT EXAM

## 2015-11-13 ENCOUNTER — Non-Acute Institutional Stay (SKILLED_NURSING_FACILITY): Payer: Medicare Other | Admitting: Adult Health

## 2015-11-13 ENCOUNTER — Encounter: Payer: Self-pay | Admitting: Adult Health

## 2015-11-13 DIAGNOSIS — F2089 Other schizophrenia: Secondary | ICD-10-CM | POA: Diagnosis not present

## 2015-11-13 DIAGNOSIS — R569 Unspecified convulsions: Secondary | ICD-10-CM

## 2015-11-13 DIAGNOSIS — K649 Unspecified hemorrhoids: Secondary | ICD-10-CM

## 2015-11-13 DIAGNOSIS — F29 Unspecified psychosis not due to a substance or known physiological condition: Secondary | ICD-10-CM | POA: Insufficient documentation

## 2015-11-13 DIAGNOSIS — E785 Hyperlipidemia, unspecified: Secondary | ICD-10-CM

## 2015-11-13 DIAGNOSIS — G2119 Other drug induced secondary parkinsonism: Secondary | ICD-10-CM | POA: Diagnosis not present

## 2015-11-13 DIAGNOSIS — I10 Essential (primary) hypertension: Secondary | ICD-10-CM

## 2015-11-13 LAB — VITAMIN D 1,25 DIHYDROXY
VIT D 1 25 DIHYDROXY: 11
Vitamin D2 1, 25 (OH)2: 11

## 2015-11-13 MED ORDER — HYDROCORTISONE 2.5 % RE CREA: 1.0000 "application " | TOPICAL_CREAM | Freq: Two times a day (BID) | RECTAL | Status: DC | PRN

## 2015-11-13 MED ORDER — LISINOPRIL 2.5 MG PO TABS: 2.5000 mg | ORAL_TABLET | Freq: Every day | ORAL | Status: DC

## 2015-11-13 NOTE — Progress Notes (Signed)
Patient ID: Christine Pena, female   DOB: 05/16/1956, 60 y.o.   MRN: [id number]   Facility[name]       Allergies  Allergen Reactions  . Depakote [Valproic Acid] Other (See Comments)    altered mental status/ high ammonia  . Haldol [Haloperidol Lactate] Other (See Comments)    Unknown reaction.    Chief Complaint  Patient presents with  . Medical Management of Chronic Issues    Follow up    HPI:    Past Medical History  Diagnosis Date  . Hypertension   . Seizures (HCC)   . Mental retardation   . Hyperlipidemia   . Anxiety   . Vitamin D deficiency   . Obesity   . Dementia   . 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 No pertinent past surgical history.  VITAL SIGNS BP 98/72 mmHg  Pulse 81  Temp(Src) 98 F (36.7 C) (Oral)  Resp 18  Ht  (1.651 m)  Wt 207 lb (93.895 kg)  BMI 34.45 kg/m2  SpO2 95%  Patient's Medications  New Prescriptions   No medications on file  Previous Medications   ACETAMINOPHEN (TYLENOL) 500 MG TABLET    Take 500 mg by mouth every 4 (four) hours as needed for mild pain or fever.    ATORVASTATIN (LIPITOR) 10 MG TABLET    Take 10 mg by mouth daily.   BENZTROPINE (COGENTIN) 1 MG TABLET    Take 1 mg by mouth 2 (two) times daily.   CHLORHEXIDINE (PERIDEX) 0.12 % SOLUTION    Use as directed 20 mLs in the mouth or throat 4 (four) times daily.    CLONAZEPAM (KLONOPIN) 1 MG TABLET    Take 1 mg by mouth daily.   FUROSEMIDE (LASIX) 20 MG TABLET    Take 20 mg by mouth 2 (two) times daily.   HYDROCORTISONE (ANUSOL-HC) 2.5 % RECTAL CREAM    Place 1 application rectally 2 (two) times daily.   LEVETIRACETAM (KEPPRA) 1000 MG TABLET    Take 1,000 mg by mouth. Take 1 tablet (1,000 mg total) by mouth See admin instructions. Take 1.5 tablet in morning and 1 table in evening   LISINOPRIL (PRINIVIL,ZESTRIL) 10 MG TABLET    Take 5 mg by mouth daily.    PHENOBARBITAL (LUMINAL) 64.8 MG TABLET    Take 2 tablets (129.6 mg  total) by mouth at bedtime.   QUETIAPINE (SEROQUEL) 50 MG TABLET    Take 0.5-1 tablets (25-50 mg total) by mouth 2 (two) times daily. Take 25 mg in the AM and 50 mg in the PM   THIOTHIXENE (NAVANE) 10 MG CAPSULE    Take 10 mg by mouth at bedtime.    VITAMIN D, ERGOCALCIFEROL, (DRISDOL) 50000 UNITS CAPS CAPSULE    Take 50,000 Units by mouth every 7 (seven) days. Takes on Saturdays.   VITAMIN E (VITAMIN E) 200 UNIT CAPSULE    Take 200 Units by mouth daily.  Modified Medications   No medications on file  Discontinued Medications   CLONAZEPAM (KLONOPIN) 1 MG TABLET    Take 1 tablet (1 mg total) by mouth daily. Take 1 mg in the AM and daily as NEEDED   LEVETIRACETAM (KEPPRA) 1000 MG TABLET    Take 1 tablet (1,000 mg total) by mouth See admin instructions. Take 1.5 tablet in morning and 1 table in evening.     SIGNIFICANT DIAGNOSTIC EXAMS  05-14-15: chest x-ray: 1. No evidence of acute traumatic injury  allowing for low lung volumes and rotation. 2. Mild vascular congestion and bibasilar atelectasis.  05-14-15: ct of head and cervical spine: 1. No acute intracranial abnormality. Cerebellar atrophy is unchanged. 2. No acute fracture or subluxation in the cervical spine.  08-17-15: chest x-ray: Mild left midlung opacity may reflect atelectasis or possibly mild pneumonia, depending on the patient's symptoms. Lungs mildly hypoexpanded  08-29-15: chest x-ray: no acute cardiopulmonary disease      LABS REVIEWED:   05-14-15: wbc 8.1; hgb 12.9; hct 37.2; mcv 86.9; plt 285; glucose 116; bun 11; creat 1.24; k+ 3.5; na++144 05-15-15: wbc 6.9; hgb 11.7; hct 34.;3 mcv 86.8; plt 251; glucose 104; bun 8; creat 0.51; k+ 3.3; na++144; phenobarb 11.9  05-25-15: phenobarb 16.6  06-16-15: chol 146; ldl 79; trig 58; hdl 55; hgb a1c 5.3; vit d 11  08-17-15: wbc 6.4; hgb 11.5; hct 34.;4 mcv 85.1; plt 340; glucose 141; bun 16; creat 0.60; k+ 3.8; na++136; liver normal albumin 3.5; phenobarb 13.0; HIV: nr; urine culture:  no growth;  blood culture: no growth  08-20-15: glucose 92; bun 10; creat 0.61; k+ 3.4; na++139 08-21-15: wbc 5.2;hgb 12.1; hct 35.0; mcv 85.0; plt 130     Review of Systems  Unable to perform ROS: Dementia      Physical Exam  Constitutional: No distress.  Obese   Eyes: Conjunctivae are normal.  Neck: Neck supple. No JVD present. No thyromegaly present.  Cardiovascular: Normal rate, regular rhythm and intact distal pulses.   Respiratory: Effort normal and breath sounds normal. No respiratory distress. She has no wheezes.  GI: Soft. Bowel sounds are normal. She exhibits no distension. There is no tenderness.  Musculoskeletal: She exhibits no edema.  Able to move all extremities   Lymphadenopathy:    She has no cervical adenopathy.  Neurological: She is alert.  Skin: Skin is warm and dry. She is not diaphoretic.  Psychiatric: She has a normal mood and affect.       ASSESSMENT/ PLAN:  1. Seizures: no reports of recent seizure activity; will continue keppra 1 gm in the AM and 1500 mg in the PM will continue phenobarbital 129.6 mg nightly her phenobarb level is 13.0   2. Hypertension: will lower lisinopril to 2.5 mg daily   3. Dyslipidemia: will continue lipitor 10 mg daily ldl is 79   4. Vit d deficiency: will continue vit d 50,000 units weekly   5. Lower extremity edema: will continue lasix 20 mg twice daily   6. UI: is presently not on medications     7. Hemorrhoids: will change anusol twice daily as needed   8. Psychosis: is without change will continue seroquel 25 mg in the AM and  50 mg nightly and will continue navane 10 mg nightly is followed by psych services   9. Secondary parkinson's disease: will continue cogentin 1 mg nightly   10. Anxiety: will continue klonopin 1 mg  daily             Synthia Innocent NP [facility]  Contact [phone] Monday through Friday 8am- 5pm  After hours call [phone]

## 2015-11-13 NOTE — Progress Notes (Signed)
Patient ID: Christine Pena, female   DOB: Feb 01, 1956, 60 y.o.   MRN: [id number]   [name]       Allergies  Allergen Reactions  . Depakote [Valproic Acid] Other (See Comments)    altered mental status/ high ammonia  . Haldol [Haloperidol Lactate] Other (See Comments)    Unknown reaction.    Chief Complaint  Patient presents with  . Medical Management of Chronic Issues    HPI:  She is a long term resident of this facility being seen for the management of her chronic illnesses. Overall there is little change in her status. She is unable to participate in the hpi or ros. There are no nursing concerns at this time.    Past Medical History  Diagnosis Date  . Hypertension   . Seizures (HCC)   . Mental retardation   . Hyperlipidemia   . Anxiety   . Vitamin D deficiency   . Obesity   . Dementia   . 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  VITAL SIGNS BP 104/70 mmHg  Pulse 80  Temp(Src) 98.3 F (36.8 C)  Ht  (1.651 m)  Wt 202 lb (91.627 kg)  BMI 33.61 kg/m2  SpO2 95%  Patient's Medications  New Prescriptions   No medications on file  Previous Medications   ACETAMINOPHEN (TYLENOL) 500 MG TABLET    Take 500 mg by mouth every 4 (four) hours as needed for mild pain or fever.    ATORVASTATIN (LIPITOR) 10 MG TABLET    Take 10 mg by mouth daily.   BENZTROPINE (COGENTIN) 1 MG TABLET    Take 1 mg by mouth 2 (two) times daily.   CHLORHEXIDINE (PERIDEX) 0.12 % SOLUTION    Use as directed 20 mLs in the mouth or throat 4 (four) times daily.    CLONAZEPAM (KLONOPIN) 1 MG TABLET    Take 1 mg by mouth daily.   FUROSEMIDE (LASIX) 20 MG TABLET    Take 20 mg by mouth 2 (two) times daily.   HYDROCORTISONE (ANUSOL-HC) 2.5 % RECTAL CREAM    Place 1 application rectally 2 (two) times daily.   LEVETIRACETAM (KEPPRA) 1000 MG TABLET    Take 1,000 mg by mouth. Take 1 tablet (1,000 mg total) by mouth See admin instructions. Take 1.5  tablet in morning and 1 table in evening   LISINOPRIL (PRINIVIL,ZESTRIL) 10 MG TABLET    Take 5 mg by mouth daily.    PHENOBARBITAL (LUMINAL) 64.8 MG TABLET    Take 2 tablets (129.6 mg total) by mouth at bedtime.   QUETIAPINE (SEROQUEL) 50 MG TABLET    Take 0.5-1 tablets (25-50 mg total) by mouth 2 (two) times daily. Take 25 mg in the AM and 50 mg in the PM   THIOTHIXENE (NAVANE) 10 MG CAPSULE    Take 10 mg by mouth at bedtime.    VITAMIN D, ERGOCALCIFEROL, (DRISDOL) 50000 UNITS CAPS CAPSULE    Take 50,000 Units by mouth every 7 (seven) days. Takes on Saturdays.   VITAMIN E (VITAMIN E) 200 UNIT CAPSULE    Take 200 Units by mouth daily.  Modified Medications   No medications on file  Discontinued Medications   No medications on file     SIGNIFICANT DIAGNOSTIC EXAMS   05-14-15: chest x-ray: 1. No evidence of acute traumatic injury allowing for low lung volumes and rotation. 2. Mild vascular congestion and bibasilar atelectasis.  05-14-15: ct of  head and cervical spine: 1. No acute intracranial abnormality. Cerebellar atrophy is unchanged. 2. No acute fracture or subluxation in the cervical spine.  08-17-15: chest x-ray: Mild left midlung opacity may reflect atelectasis or possibly mild pneumonia, depending on the patient's symptoms. Lungs mildly hypoexpanded  08-29-15: chest x-ray: no acute cardiopulmonary disease      LABS REVIEWED:   05-14-15: wbc 8.1; hgb 12.9; hct 37.2; mcv 86.9; plt 285; glucose 116; bun 11; creat 1.24; k+ 3.5; na++144 05-15-15: wbc 6.9; hgb 11.7; hct 34.;3 mcv 86.8; plt 251; glucose 104; bun 8; creat 0.51; k+ 3.3; na++144; phenobarb 11.9  05-25-15: phenobarb 16.6  06-16-15: chol 146; ldl 79; trig 58; hdl 55; hgb a1c 5.3; vit d 11  08-17-15: wbc 6.4; hgb 11.5; hct 34.;4 mcv 85.1; plt 340; glucose 141; bun 16; creat 0.60; k+ 3.8; na++136; liver normal albumin 3.5; phenobarb 13.0; HIV: nr; urine culture: no growth;  blood culture: no growth  08-20-15: glucose 92; bun  10; creat 0.61; k+ 3.4; na++139 08-21-15: wbc 5.2;hgb 12.1; hct 35.0; mcv 85.0; plt 130     Review of Systems  Unable to perform ROS: Dementia      Physical Exam  Constitutional: No distress.  Obese   Eyes: Conjunctivae are normal.  Neck: Neck supple. No JVD present. No thyromegaly present.  Cardiovascular: Normal rate, regular rhythm and intact distal pulses.   Respiratory: Effort normal and breath sounds normal. No respiratory distress. She has no wheezes.  GI: Soft. Bowel sounds are normal. She exhibits no distension. There is no tenderness.  Musculoskeletal: She exhibits no edema.  Able to move all extremities   Lymphadenopathy:    She has no cervical adenopathy.  Neurological: She is alert.  Skin: Skin is warm and dry. She is not diaphoretic.  Psychiatric: She has a normal mood and affect.       ASSESSMENT/ PLAN:  1. Seizures: no reports of recent seizure activity; will continue keppra 1 gm in the AM and 1500 mg in the PM will continue phenobarbital 129.6 mg nightly her phenobarb level is 13.0   2. Hypertension: will continue 5 mg daily   3. Dyslipidemia: will continue lipitor 10 mg daily ldl is 79   4. Vit d deficiency: will continue vit d 50,000 units weekly   5. Lower extremity edema: will continue lasix 20 mg twice daily   6. UI: is presently not on medications     7. Hemorrhoids: will continue anusol twice daily   8. Psychosis: is without change will continue seroquel 25 mg in the AM and  50 mg nightly and will continue navane 10 mg nightly   9. Secondary parkinson's disease: will continue cogentin 1 mg nightly   10. Anxiety: will continue klonopin 1 mg twice daily          Synthia Innocent NP [facility]  Contact [phone] Monday through Friday 8am- 5pm  After hours call [phone]

## 2015-11-16 ENCOUNTER — Encounter: Payer: Self-pay | Admitting: Internal Medicine

## 2015-11-17 DIAGNOSIS — G20 Parkinson's disease: Secondary | ICD-10-CM | POA: Diagnosis not present

## 2015-11-17 DIAGNOSIS — B351 Tinea unguium: Secondary | ICD-10-CM | POA: Diagnosis not present

## 2015-11-17 DIAGNOSIS — M79671 Pain in right foot: Secondary | ICD-10-CM | POA: Diagnosis not present

## 2015-11-17 DIAGNOSIS — M79672 Pain in left foot: Secondary | ICD-10-CM | POA: Diagnosis not present

## 2015-12-12 ENCOUNTER — Encounter: Payer: Self-pay | Admitting: Adult Health

## 2015-12-12 ENCOUNTER — Non-Acute Institutional Stay (SKILLED_NURSING_FACILITY): Payer: Medicare Other | Admitting: Adult Health

## 2015-12-12 DIAGNOSIS — I10 Essential (primary) hypertension: Secondary | ICD-10-CM

## 2015-12-12 DIAGNOSIS — R600 Localized edema: Secondary | ICD-10-CM

## 2015-12-12 DIAGNOSIS — E559 Vitamin D deficiency, unspecified: Secondary | ICD-10-CM

## 2015-12-12 DIAGNOSIS — F79 Unspecified intellectual disabilities: Secondary | ICD-10-CM

## 2015-12-12 DIAGNOSIS — F2089 Other schizophrenia: Secondary | ICD-10-CM

## 2015-12-12 DIAGNOSIS — E785 Hyperlipidemia, unspecified: Secondary | ICD-10-CM | POA: Diagnosis not present

## 2015-12-12 DIAGNOSIS — R569 Unspecified convulsions: Secondary | ICD-10-CM | POA: Diagnosis not present

## 2015-12-12 DIAGNOSIS — G2119 Other drug induced secondary parkinsonism: Secondary | ICD-10-CM | POA: Diagnosis not present

## 2015-12-12 DIAGNOSIS — F29 Unspecified psychosis not due to a substance or known physiological condition: Secondary | ICD-10-CM | POA: Diagnosis not present

## 2015-12-12 NOTE — Progress Notes (Signed)
Patient ID: Christine EagleDebra L Mossa, female   DOB: 09/18/1956, 60 y.o.   MRN: [id number]   [facility]      Allergies  Allergen Reactions  . Depakote [Valproic Acid] Other (See Comments)    altered mental status/ high ammonia  . Haldol [Haloperidol Lactate] Other (See Comments)    Unknown reaction.    Chief Complaint  Patient presents with  . Medical Management of Chronic Issues    Follow up    HPI:  She is a long term resident of this facility being seen for the management of her chronic illnesses. Overall there is little change in her status. She is unable to fully participate in the hpi or ros. There are no nursing concerns at this time.   Past Medical History  Diagnosis Date  . Hypertension   . Seizures (HCC)   . Mental retardation   . Hyperlipidemia   . Anxiety   . Vitamin D deficiency   . Obesity   . Dementia   . 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 No pertinent past surgical history.  VITAL SIGNS BP 128/76 mmHg  Pulse 86  Temp(Src) 98.6 F (37 C) (Oral)  Ht 5\' 5"  (1.651 m)  Wt 191 lb (86.637 kg)  BMI 31.78 kg/m2  SpO2 95%  Patient's Medications  New Prescriptions   No medications on file  Previous Medications   ACETAMINOPHEN (TYLENOL) 500 MG TABLET    Take 500 mg by mouth every 4 (four) hours as needed for mild pain or fever.    ATORVASTATIN (LIPITOR) 10 MG TABLET    Take 10 mg by mouth daily.   BENZTROPINE (COGENTIN) 1 MG TABLET    Take 1 mg by mouth 2 (two) times daily.   CHLORHEXIDINE (PERIDEX) 0.12 % SOLUTION    Use as directed 20 mLs in the mouth or throat 4 (four) times daily.    CLONAZEPAM (KLONOPIN) 1 MG TABLET    Take 1 mg by mouth daily.   FUROSEMIDE (LASIX) 20 MG TABLET    Take 20 mg by mouth 2 (two) times daily.   HYDROCORTISONE (ANUSOL-HC) 2.5 % RECTAL CREAM    Place 1 application rectally 2 (two) times daily as needed for hemorrhoids or itching.   LEVETIRACETAM (KEPPRA) 1000 MG TABLET    Take 1 tablet  (1,000 mg total) by mouth See admin instructions. Take 1.5 tablet in morning and 1 table in evening   LISINOPRIL (ZESTRIL) 2.5 MG TABLET    Take 1 tablet (2.5 mg total) by mouth daily.   PHENOBARBITAL (LUMINAL) 64.8 MG TABLET    Take 2 tablets (129.6 mg total) by mouth at bedtime.   QUETIAPINE (SEROQUEL) 25 MG TABLET    Take 25 mg by mouth daily. Also take 50 mg qhs.   QUETIAPINE (SEROQUEL) 50 MG TABLET    Take 50 mg by mouth at bedtime.   THIOTHIXENE (NAVANE) 10 MG CAPSULE    Take 10 mg by mouth at bedtime.    VITAMIN D, ERGOCALCIFEROL, (DRISDOL) 50000 UNITS CAPS CAPSULE    Take 50,000 Units by mouth every 7 (seven) days. Takes on Saturdays.   VITAMIN E (VITAMIN E) 200 UNIT CAPSULE    Take 400 Units by mouth daily.   Modified Medications   No medications on file  Discontinued Medications     SIGNIFICANT DIAGNOSTIC EXAMS  05-14-15: chest x-ray: 1. No evidence of acute traumatic injury allowing for low lung volumes and rotation. 2. Mild vascular congestion  and bibasilar atelectasis.  05-14-15: ct of head and cervical spine: 1. No acute intracranial abnormality. Cerebellar atrophy is unchanged. 2. No acute fracture or subluxation in the cervical spine.  08-17-15: chest x-ray: Mild left midlung opacity may reflect atelectasis or possibly mild pneumonia, depending on the patient's symptoms. Lungs mildly hypoexpanded  08-29-15: chest x-ray: no acute cardiopulmonary disease      LABS REVIEWED:   05-14-15: wbc 8.1; hgb 12.9; hct 37.2; mcv 86.9; plt 285; glucose 116; bun 11; creat 1.24; k+ 3.5; na++144 05-15-15: wbc 6.9; hgb 11.7; hct 34.;3 mcv 86.8; plt 251; glucose 104; bun 8; creat 0.51; k+ 3.3; na++144; phenobarb 11.9  05-25-15: phenobarb 16.6  06-16-15: chol 146; ldl 79; trig 58; hdl 55; hgb a1c 5.3; vit d 11  08-17-15: wbc 6.4; hgb 11.5; hct 34.;4 mcv 85.1; plt 340; glucose 141; bun 16; creat 0.60; k+ 3.8; na++136; liver normal albumin 3.5; phenobarb 13.0; HIV: nr; urine culture: no  growth;  blood culture: no growth  08-20-15: glucose 92; bun 10; creat 0.61; k+ 3.4; na++139 08-21-15: wbc 5.2;hgb 12.1; hct 35.0; mcv 85.0; plt 130     Review of Systems  Unable to perform ROS: Dementia      Physical Exam  Constitutional: No distress.  Obese   Eyes: Conjunctivae are normal.  Neck: Neck supple. No JVD present. No thyromegaly present.  Cardiovascular: Normal rate, regular rhythm and intact distal pulses.   Respiratory: Effort normal and breath sounds normal. No respiratory distress. She has no wheezes.  GI: Soft. Bowel sounds are normal. She exhibits no distension. There is no tenderness.  Musculoskeletal: She exhibits no edema.  Able to move all extremities   Lymphadenopathy:    She has no cervical adenopathy.  Neurological: She is alert.  Skin: Skin is warm and dry. She is not diaphoretic.  Psychiatric: She has a normal mood and affect.       ASSESSMENT/ PLAN:  1. Seizures: no reports of recent seizure activity; will continue keppra 1 gm in the AM and 1500 mg in the PM will continue phenobarbital 129.6 mg nightly her phenobarb level is 13.0   2. Hypertension: will continue  lisinopril  2.5 mg daily   3. Dyslipidemia: will continue lipitor 10 mg daily ldl is 79   4. Vit d deficiency: will continue vit d 50,000 units weekly   5. Lower extremity edema: will continue lasix 20 mg twice daily   6. UI: is presently not on medications     7. Hemorrhoids: will change anusol twice daily as needed   8. Psychosis: is without change will continue seroquel 25 mg in the AM and  50 mg nightly and will continue navane 10 mg nightly is followed by psych services   9. Secondary parkinson's disease: will continue cogentin 1 mg nightly   10. Anxiety: will continue klonopin 1 mg  daily     Will check lipids and hgb a1c vit d and cologuard       Synthia Innocent NP [facility]  Contact [phone] Monday through Friday 8am- 5pm  After hours call  [phone]

## 2015-12-13 DIAGNOSIS — I10 Essential (primary) hypertension: Secondary | ICD-10-CM | POA: Diagnosis not present

## 2015-12-13 DIAGNOSIS — E559 Vitamin D deficiency, unspecified: Secondary | ICD-10-CM | POA: Diagnosis not present

## 2015-12-13 DIAGNOSIS — E089 Diabetes mellitus due to underlying condition without complications: Secondary | ICD-10-CM | POA: Diagnosis not present

## 2015-12-13 LAB — LIPID PANEL
Cholesterol: 167 mg/dL (ref 0–200)
HDL: 50 mg/dL (ref 35–70)
LDL CALC: 90 mg/dL
TRIGLYCERIDES: 134 mg/dL (ref 40–160)

## 2015-12-13 LAB — HEMOGLOBIN A1C: HEMOGLOBIN A1C: 5.1

## 2015-12-14 ENCOUNTER — Encounter: Payer: Self-pay | Admitting: Adult Health

## 2015-12-14 NOTE — Progress Notes (Signed)
This encounter was created in error - please disregard.

## 2015-12-25 DIAGNOSIS — Z79899 Other long term (current) drug therapy: Secondary | ICD-10-CM | POA: Diagnosis not present

## 2015-12-25 DIAGNOSIS — H2513 Age-related nuclear cataract, bilateral: Secondary | ICD-10-CM | POA: Diagnosis not present

## 2015-12-31 ENCOUNTER — Emergency Department (HOSPITAL_COMMUNITY): Payer: Medicare Other

## 2015-12-31 ENCOUNTER — Encounter (HOSPITAL_COMMUNITY): Payer: Self-pay | Admitting: Emergency Medicine

## 2015-12-31 ENCOUNTER — Emergency Department (HOSPITAL_COMMUNITY)
Admission: EM | Admit: 2015-12-31 | Discharge: 2015-12-31 | Disposition: A | Discharge status: Home / Self Care | Payer: Medicare Other | Attending: Emergency Medicine | Admitting: Emergency Medicine

## 2015-12-31 DIAGNOSIS — N390 Urinary tract infection, site not specified: Secondary | ICD-10-CM | POA: Diagnosis not present

## 2015-12-31 DIAGNOSIS — G20 Parkinson's disease: Secondary | ICD-10-CM | POA: Diagnosis not present

## 2015-12-31 DIAGNOSIS — I10 Essential (primary) hypertension: Secondary | ICD-10-CM | POA: Insufficient documentation

## 2015-12-31 DIAGNOSIS — F419 Anxiety disorder, unspecified: Secondary | ICD-10-CM | POA: Diagnosis not present

## 2015-12-31 DIAGNOSIS — E559 Vitamin D deficiency, unspecified: Secondary | ICD-10-CM | POA: Insufficient documentation

## 2015-12-31 DIAGNOSIS — E669 Obesity, unspecified: Secondary | ICD-10-CM | POA: Diagnosis not present

## 2015-12-31 DIAGNOSIS — Z79899 Other long term (current) drug therapy: Secondary | ICD-10-CM | POA: Diagnosis not present

## 2015-12-31 DIAGNOSIS — G40409 Other generalized epilepsy and epileptic syndromes, not intractable, without status epilepticus: Secondary | ICD-10-CM | POA: Diagnosis not present

## 2015-12-31 DIAGNOSIS — F0390 Unspecified dementia without behavioral disturbance: Secondary | ICD-10-CM | POA: Insufficient documentation

## 2015-12-31 DIAGNOSIS — R569 Unspecified convulsions: Secondary | ICD-10-CM | POA: Diagnosis not present

## 2015-12-31 DIAGNOSIS — R402431 Glasgow coma scale score 3-8, in the field [EMT or ambulance]: Secondary | ICD-10-CM | POA: Diagnosis not present

## 2015-12-31 DIAGNOSIS — R55 Syncope and collapse: Secondary | ICD-10-CM | POA: Diagnosis not present

## 2015-12-31 LAB — RAPID URINE DRUG SCREEN, HOSP PERFORMED
Amphetamines: NOT DETECTED
Barbiturates: POSITIVE — AB
Benzodiazepines: NOT DETECTED
COCAINE: NOT DETECTED
OPIATES: NOT DETECTED
Tetrahydrocannabinol: NOT DETECTED

## 2015-12-31 LAB — COMPREHENSIVE METABOLIC PANEL
ALBUMIN: 3.8 g/dL (ref 3.5–5.0)
ALK PHOS: 60 U/L (ref 38–126)
ALT: 19 U/L (ref 14–54)
ANION GAP: 10 (ref 5–15)
AST: 20 U/L (ref 15–41)
BILIRUBIN TOTAL: 0.4 mg/dL (ref 0.3–1.2)
BUN: 19 mg/dL (ref 6–20)
CALCIUM: 9.8 mg/dL (ref 8.9–10.3)
CO2: 27 mmol/L (ref 22–32)
Chloride: 108 mmol/L (ref 101–111)
Creatinine, Ser: 0.63 mg/dL (ref 0.44–1.00)
GFR calc non Af Amer: >60 mL/min (ref >60)
Glucose, Bld: 112 mg/dL — ABNORMAL HIGH (ref 65–99)
POTASSIUM: 4.5 mmol/L (ref 3.5–5.1)
SODIUM: 145 mmol/L (ref 135–145)
TOTAL PROTEIN: 6.7 g/dL (ref 6.5–8.1)

## 2015-12-31 LAB — URINE MICROSCOPIC-ADD ON

## 2015-12-31 LAB — DIFFERENTIAL
BASOS PCT: 0 %
Basophils Absolute: 0 10*3/uL (ref 0.0–0.1)
EOS PCT: 0 %
Eosinophils Absolute: 0 10*3/uL (ref 0.0–0.7)
Lymphocytes Relative: 27 %
Lymphs Abs: 1.9 10*3/uL (ref 0.7–4.0)
MONOS PCT: 5 %
Monocytes Absolute: 0.4 10*3/uL (ref 0.1–1.0)
Neutro Abs: 5 10*3/uL (ref 1.7–7.7)
Neutrophils Relative %: 68 %

## 2015-12-31 LAB — I-STAT CHEM 8, ED
BUN: 18 mg/dL (ref 6–20)
CALCIUM ION: 1.17 mmol/L (ref 1.13–1.30)
Chloride: 106 mmol/L (ref 101–111)
Creatinine, Ser: 0.6 mg/dL (ref 0.44–1.00)
Glucose, Bld: 111 mg/dL — ABNORMAL HIGH (ref 65–99)
HCT: 37 % (ref 36.0–46.0)
HEMOGLOBIN: 12.6 g/dL (ref 12.0–15.0)
Potassium: 4.3 mmol/L (ref 3.5–5.1)
SODIUM: 144 mmol/L (ref 135–145)
TCO2: 27 mmol/L (ref 0–100)

## 2015-12-31 LAB — CBC
HEMATOCRIT: 37.9 % (ref 36.0–46.0)
Hemoglobin: 12.8 g/dL (ref 12.0–15.0)
MCH: 29.5 pg (ref 26.0–34.0)
MCHC: 33.8 g/dL (ref 30.0–36.0)
MCV: 87.3 fL (ref 78.0–100.0)
PLATELETS: 357 10*3/uL (ref 150–400)
RBC: 4.34 MIL/uL (ref 3.87–5.11)
RDW: 12.3 % (ref 11.5–15.5)
WBC: 7.3 10*3/uL (ref 4.0–10.5)

## 2015-12-31 LAB — URINALYSIS, ROUTINE W REFLEX MICROSCOPIC
Bilirubin Urine: NEGATIVE
GLUCOSE, UA: NEGATIVE mg/dL
Hgb urine dipstick: NEGATIVE
Ketones, ur: NEGATIVE mg/dL
NITRITE: POSITIVE — AB
PH: 8 (ref 5.0–8.0)
Protein, ur: NEGATIVE mg/dL
SPECIFIC GRAVITY, URINE: 1.017 (ref 1.005–1.030)

## 2015-12-31 LAB — I-STAT TROPONIN, ED: Troponin i, poc: 0 ng/mL (ref 0.00–0.08)

## 2015-12-31 LAB — APTT: aPTT: 30 seconds (ref 24–37)

## 2015-12-31 LAB — PROTIME-INR
INR: 1.04 (ref 0.00–1.49)
PROTHROMBIN TIME: 13.8 s (ref 11.6–15.2)

## 2015-12-31 LAB — ETHANOL

## 2015-12-31 MED ORDER — CEPHALEXIN 500 MG PO CAPS: 500.0000 mg | ORAL_CAPSULE | Freq: Four times a day (QID) | ORAL | Status: DC

## 2015-12-31 MED ORDER — LORAZEPAM 2 MG/ML IJ SOLN
2.0000 mg | Freq: Once | INTRAMUSCULAR | Status: AC
  Administered 2015-12-31: 2 mg via INTRAVENOUS
  Filled 2015-12-31: qty 1

## 2015-12-31 MED ORDER — LEVETIRACETAM 750 MG PO TABS: 1500.0000 mg | ORAL_TABLET | Freq: Every evening | ORAL | Status: DC

## 2015-12-31 MED ORDER — DEXTROSE 5 % IV SOLN
1.0000 g | Freq: Once | INTRAVENOUS | Status: AC
  Administered 2015-12-31: 1 g via INTRAVENOUS
  Filled 2015-12-31: qty 10

## 2015-12-31 MED ORDER — SODIUM CHLORIDE 0.9 % IV SOLN
1500.0000 mg | Freq: Once | INTRAVENOUS | Status: AC
  Administered 2015-12-31: 1500 mg via INTRAVENOUS
  Filled 2015-12-31: qty 15

## 2015-12-31 NOTE — ED Notes (Signed)
[facility] [address] [place]  Facility states patient had a witnessed seizure and then became unresponsive.  At this time patient follows commands, is breathing independently, gaze fixed to the left.  MD at the bedside.  Not a code stroke per MD.

## 2015-12-31 NOTE — Consult Note (Signed)
Reason for Consult: Seizure Referring Physician: Dr Clayborne Dana  CC: Seizure  HPI: Christine Pena is an 60 y.o. 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 Currently there are no family members or caregivers available to provide a history. The patient attempts to answer questions; however, her answers cannot be understood. According to the chart she resides at a nursing facility where she had a witnessed seizure today and then became unresponsive. Currently she is alert, moving all extremities, attempting to answer questions, following some commands, and having tremors of the extremities.  Past Medical History  Diagnosis Date  . Hypertension   . Seizures (HCC)   . Mental retardation   . Hyperlipidemia   . Anxiety   . Vitamin D deficiency   . Obesity   . Dementia   . 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 No pertinent past surgical history.  Family History  Problem Relation Age of Onset  . Heart disease Mother     Social History:  reports that she has never smoked. She has never used smokeless tobacco. She reports that she does not drink alcohol or use illicit drugs.  Allergies  Allergen Reactions  . Depakote [Valproic Acid] Other (See Comments)    altered mental status/ high ammonia  . Haldol [Haloperidol Lactate] Other (See Comments)    Unknown reaction.    Medications:  No current facility-administered medications for this encounter.   Current Outpatient Prescriptions  Medication Sig Dispense Refill  . acetaminophen (TYLENOL) 500 MG tablet Take 500 mg by mouth every 4 (four) hours as needed for mild pain or fever.     . chlorhexidine (PERIDEX) 0.12 % solution Use as directed 20 mLs in the mouth or throat 4 (four) times daily.     . clonazePAM (KLONOPIN) 1 MG tablet Take 1 mg by  mouth daily.    . hydrocortisone (ANUSOL-HC) 2.5 % rectal cream Place 1 application rectally 2 (two) times daily as needed for hemorrhoids or itching. 30 g prn  . levETIRAcetam (KEPPRA) 1000 MG tablet Take 1,000 mg by mouth every evening.     . magnesium hydroxide (MILK OF MAGNESIA) 400 MG/5ML suspension Take 30 mLs by mouth daily as needed for mild constipation.    . QUEtiapine (SEROQUEL) 50 MG tablet Take 50 mg by mouth at bedtime.    Marland Kitchen thiothixene (NAVANE) 10 MG capsule Take 10 mg by mouth at bedtime.     . triamcinolone cream (KENALOG) 0.1 % Apply 1 application topically 2 (two) times daily.    . Vitamin D, Ergocalciferol, (DRISDOL) 50000 UNITS CAPS capsule Take 50,000 Units by mouth every 7 (seven) days. Takes on Saturdays.    . vitamin E (VITAMIN E) 200 UNIT capsule Take 400 Units by mouth daily.     . benztropine (COGENTIN) 1 MG tablet Take 1 mg by mouth 2 (two) times daily.    . furosemide (LASIX) 20 MG tablet Take 20 mg by mouth 2 (two) times daily. Reported on 12/31/2015    . lisinopril (ZESTRIL) 2.5 MG tablet Take 1 tablet (2.5 mg total) by mouth daily. 90 tablet prn  . PHENobarbital (LUMINAL) 64.8 MG tablet Take 2 tablets (129.6 mg total) by mouth at bedtime. 60 tablet 0   Keppra - correct dose is 1500 mg AM and 1000 mg PM according to pt's nurse at [facility].   ROS:  Unobtainable from the patient secondary to dementia and inability to communicate.  Physical Examination: Blood pressure 141/81, pulse 78, resp. rate 19, SpO2 100 %.  General - 60 year old female in no acute distress. Heart - Regular rate and rhythm Lungs - Clear to auscultation Abdomen - obese, soft - non tender Extremities - Distal pulses weak to absent with 2+ edema bilaterally Skin - Warm and dry   Neurologic Examination - difficult exam as the patient could not follow all instructions.  Mental Status: Alert, no meaningful verbal communication from the patient. One-word nonsensical answers to questions.   Able to follow some commands. Cranial Nerves: II: Discs not visualized; Visual fields could not be tested, pupils equal, round, reactive to light and accommodation III,IV, VI: ptosis not present, extra-ocular motions intact bilaterally with cueing.  V,VII: smile symmetric VIII: hearing normal bilaterally IX,X: gag reflex present XI: bilateral shoulder shrug XII: midline tongue extension Motor: Able to move all extremities against gravity.  Tone and bulk:normal tone throughout; no atrophy noted Sensory: Difficult to assess secondary to communication issues. Deep Tendon Reflexes:  Diminished throughout Plantars: Right: downgoing   Left: downgoing Cerebellar: Finger to nose testing performed slowly and with some difficulty but appeared intact. Gait: Not attempted   Laboratory Studies:   Basic Metabolic Panel:  Recent Labs Lab 12/31/15 1529  NA 144  K 4.3  CL 106  GLUCOSE 111*  BUN 18  CREATININE 0.60    Liver Function Tests: No results for input(s): AST, ALT, ALKPHOS, BILITOT, PROT, ALBUMIN in the last 168 hours. No results for input(s): LIPASE, AMYLASE in the last 168 hours. No results for input(s): AMMONIA in the last 168 hours.  CBC:  Recent Labs Lab 12/31/15 1529 12/31/15 1537  WBC  --  7.3  NEUTROABS  --  5.0  HGB 12.6 12.8  HCT 37.0 37.9  MCV  --  87.3  PLT  --  357    Cardiac Enzymes: No results for input(s): CKTOTAL, CKMB, CKMBINDEX, TROPONINI in the last 168 hours.  BNP: Invalid input(s): POCBNP  CBG: No results for input(s): GLUCAP in the last 168 hours.  Microbiology: Results for orders placed or performed during the hospital encounter of 08/16/15  Urine culture     Status: None   Collection Time: 08/17/15  1:09 AM  Result Value Ref Range Status   Specimen Description URINE, CATHETERIZED  Final   Special Requests NONE  Final   Culture NO GROWTH 1 DAY  Final   Report Status 08/18/2015 FINAL  Final  Culture, blood (routine x 2)     Status:  None   Collection Time: 08/17/15  2:25 AM  Result Value Ref Range Status   Specimen Description BLOOD LEFT HAND  Final   Special Requests BOTTLES DRAWN AEROBIC AND ANAEROBIC 5CC  Final   Culture NO GROWTH 5 DAYS  Final   Report Status 08/22/2015 FINAL  Final  Culture, blood (routine x 2)     Status: None   Collection Time: 08/17/15  2:30 AM  Result Value Ref Range Status   Specimen Description BLOOD LEFT ARM  Final   Special Requests BOTTLES DRAWN AEROBIC AND ANAEROBIC 5CC  Final   Culture  Setup Time   Final    ANAEROBIC BOTTLE ONLY GRAM POSITIVE RODS CRITICAL RESULT CALLED TO, READ BACK BY AND VERIFIED WITH: J.STEELMAN,RN [id number] 08/22/15 M.CAMPBELL    Culture   Final    DIPHTHEROIDS(CORYNEBACTERIUM SPECIES) Standardized susceptibility testing for this organism is not available.  Report Status 08/23/2015 FINAL  Final  MRSA PCR Screening     Status: None   Collection Time: 08/17/15  8:09 AM  Result Value Ref Range Status   MRSA by PCR NEGATIVE NEGATIVE Final    Comment:        The GeneXpert MRSA Assay (FDA approved for NASAL specimens only), is one component of a comprehensive MRSA colonization surveillance program. It is not intended to diagnose MRSA infection nor to guide or monitor treatment for MRSA infections.   Culture, blood (routine x 2) Call MD if unable to obtain prior to antibiotics being given     Status: None   Collection Time: 08/17/15  8:56 AM  Result Value Ref Range Status   Specimen Description BLOOD LEFT HAND  Final   Special Requests BOTTLES DRAWN AEROBIC AND ANAEROBIC  5CC  Final   Culture NO GROWTH 5 DAYS  Final   Report Status 08/22/2015 FINAL  Final  Culture, blood (routine x 2) Call MD if unable to obtain prior to antibiotics being given     Status: None   Collection Time: 08/17/15  9:01 AM  Result Value Ref Range Status   Specimen Description BLOOD RIGHT HAND  Final   Special Requests BOTTLES DRAWN AEROBIC AND ANAEROBIC  8CC  Final   Culture  NO GROWTH 5 DAYS  Final   Report Status 08/22/2015 FINAL  Final    Coagulation Studies:  Recent Labs  12/31/15 1537  LABPROT 13.8  INR 1.04    Urinalysis: No results for input(s): COLORURINE, LABSPEC, PHURINE, GLUCOSEU, HGBUR, BILIRUBINUR, KETONESUR, PROTEINUR, UROBILINOGEN, NITRITE, LEUKOCYTESUR in the last 168 hours.  Invalid input(s): APPERANCEUR  Lipid Panel:     Component Value Date/Time   CHOL 146 06/16/2015   TRIG 58 06/16/2015   HDL 55 06/16/2015   LDLCALC 79 06/16/2015    HgbA1C:  Lab Results  Component Value Date   HGBA1C 5.3 06/16/2015    Urine Drug Screen:     Component Value Date/Time   LABOPIA NONE DETECTED 03/09/2015 2150   COCAINSCRNUR NONE DETECTED 03/09/2015 2150   LABBENZ NONE DETECTED 03/09/2015 2150   AMPHETMU NONE DETECTED 03/09/2015 2150   THCU NONE DETECTED 03/09/2015 2150   LABBARB POSITIVE* 03/09/2015 2150    Alcohol Level: No results for input(s): ETH in the last 168 hours.  Other results: EKG: Pending  Imaging:  Ct Head Wo Contrast 12/31/2015   1. No acute intracranial abnormalities. 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 AEDs include Levetiracetam 1,500 mg AM 1000 mg each evening, phenobarbital 129.6 mg at bedtime, and clonazepam 1 mg daily. A Levetiracetam level is pending.   (History of VALPROIC ACID ALLERGY)   PLAN - per discussion with Dr Lavon Paganini 1. Increase Keppra to 1500 mg twice daily 2. Outpatient EEG 3. Early follow-up with patient's neurologist.   Delton See PA-C [facility] Pager [phone] 12/31/2015, 5:02 PM

## 2015-12-31 NOTE — ED Provider Notes (Signed)
CSN: [id number]     Arrival date & time 12/31/15  1437 History   First MD Initiated Contact with Patient 12/31/15 1456     Chief Complaint  Patient presents with  . Seizures     (Consider location/radiation/quality/duration/timing/severity/associated sxs/prior Treatment) Patient is a 60 y.o. female presenting with seizures.  Seizures Seizure activity on arrival: no   Seizure type:  Grand mal Initial focality:  None Episode characteristics: abnormal movements and unresponsiveness   Return to baseline: no   Severity:  Moderate Timing:  Once Recent head injury:  No recent head injuries PTA treatment:  None History of seizures: yes   Date of most recent prior episode:  Today Severity:  Mild   Past Medical History  Diagnosis Date  . Hypertension   . Seizures (HCC)   . Mental retardation   . Hyperlipidemia   . Anxiety   . Vitamin D deficiency   . Obesity   . Dementia   . 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 No pertinent past surgical history. Family History  Problem Relation Age of Onset  . Heart disease Mother    Social History  Substance Use Topics  . Smoking status: Never Smoker   . Smokeless tobacco: Never Used  . Alcohol Use: No   OB History    No data available     Review of Systems  Unable to perform ROS: Mental status change  Neurological: Positive for seizures.      Allergies  Depakote and Haldol  Home Medications   Prior to Admission medications   Medication Sig Start Date End Date Taking? Authorizing Provider  acetaminophen (TYLENOL) 500 MG tablet Take 500 mg by mouth every 4 (four) hours as needed for mild pain or fever.    Yes Historical Provider, MD  chlorhexidine (PERIDEX) 0.12 % solution Use as directed 20 mLs in the mouth or throat 4 (four) times daily.    Yes Historical Provider, MD  clonazePAM (KLONOPIN) 1 MG tablet Take 1 mg by mouth daily.   Yes Historical Provider, MD  hydrocortisone (ANUSOL-HC) 2.5 %  rectal cream Place 1 application rectally 2 (two) times daily as needed for hemorrhoids or itching. 11/13/15  Yes Sharee Holster, NP  magnesium hydroxide (MILK OF MAGNESIA) 400 MG/5ML suspension Take 30 mLs by mouth daily as needed for mild constipation.   Yes Historical Provider, MD  QUEtiapine (SEROQUEL) 50 MG tablet Take 50 mg by mouth at bedtime.   Yes Historical Provider, MD  thiothixene (NAVANE) 10 MG capsule Take 10 mg by mouth at bedtime.    Yes Historical Provider, MD  triamcinolone cream (KENALOG) 0.1 % Apply 1 application topically 2 (two) times daily.   Yes Historical Provider, MD  Vitamin D, Ergocalciferol, (DRISDOL) 50000 UNITS CAPS capsule Take 50,000 Units by mouth every 7 (seven) days. Takes on Saturdays.   Yes Historical Provider, MD  vitamin E (VITAMIN E) 200 UNIT capsule Take 400 Units by mouth daily.    Yes Historical Provider, MD  benztropine (COGENTIN) 1 MG tablet Take 1 mg by mouth 2 (two) times daily.    Historical Provider, MD  cephALEXin (KEFLEX) 500 MG capsule Take 1 capsule (500 mg total) by mouth 4 (four) times daily. 12/31/15   Marily Memos, MD  furosemide (LASIX) 20 MG tablet Take 20 mg by mouth 2 (two) times daily. Reported on 12/31/2015    Historical Provider, MD  levETIRAcetam (KEPPRA) 750 MG tablet Take 2 tablets (1,500 mg total) by mouth  every evening. 12/31/15   Marily MemosJason Jayni Prescher, MD  lisinopril (ZESTRIL) 2.5 MG tablet Take 1 tablet (2.5 mg total) by mouth daily. 11/13/15   Sharee Holstereborah S Green, NP  PHENobarbital (LUMINAL) 64.8 MG tablet Take 2 tablets (129.6 mg total) by mouth at bedtime. 05/19/15   [name] Carter, DO   BP 141/81 mmHg  Pulse 78  Resp 19  SpO2 100% Physical Exam  Constitutional: She appears well-developed and well-nourished.  HENT:  Head: Normocephalic and atraumatic.  Neck: Normal range of motion.  Cardiovascular: Normal rate and regular rhythm.   Pulmonary/Chest: No stridor. No respiratory distress.  Abdominal: She exhibits no distension.  Neurological:   Doesn't follow commands well.  Has good tone in extremities.  Fixed left upward gaze Right sided neglect earlier.   Nursing note and vitals reviewed.   ED Course  Procedures (including critical care time) Labs Review Labs Reviewed  COMPREHENSIVE METABOLIC PANEL - Abnormal; Notable for the following:    Glucose, Bld 112 (*)    All other components within normal limits  URINE RAPID DRUG SCREEN, HOSP PERFORMED - Abnormal; Notable for the following:    Barbiturates POSITIVE (*)    All other components within normal limits  URINALYSIS, ROUTINE W REFLEX MICROSCOPIC (NOT [facility]) - Abnormal; Notable for the following:    APPearance CLOUDY (*)    Nitrite POSITIVE (*)    Leukocytes, UA MODERATE (*)    All other components within normal limits  URINE MICROSCOPIC-ADD ON - Abnormal; Notable for the following:    Squamous Epithelial / LPF 0-5 (*)    Bacteria, UA MANY (*)    All other components within normal limits  I-STAT CHEM 8, ED - Abnormal; Notable for the following:    Glucose, Bld 111 (*)    All other components within normal limits  URINE CULTURE  ETHANOL  PROTIME-INR  APTT  CBC  DIFFERENTIAL  LEVETIRACETAM LEVEL  I-STAT TROPOININ, ED    Imaging Review Ct Head Wo Contrast  12/31/2015  CLINICAL DATA:  LEFT SIDED GAZE AND SOME SLIGHT AMSwitnessed seizure and then became unresponsive TODAY PMH: HTN, Seizures; Mental retardation; Hyperlipidemia; Anxiety; Vitamin D deficiency; Obesity; Dementia; Secondary Parkinson disease EXAM: CT HEAD WITHOUT CONTRAST TECHNIQUE: Contiguous axial images were obtained from the base of the skull through the vertex without intravenous contrast. COMPARISON:  05/14/2015 FINDINGS: The ventricles are normal in size and configuration. There are no parenchymal masses or mass effect. There is no evidence of an infarct. There are no extra-axial masses or abnormal fluid collections. There is a widened extra-axial space in the posterior fossa, stable from prior  study. There is no intracranial hemorrhage. Visualized sinuses and mastoid air cells are clear IMPRESSION: 1. No acute intracranial abnormalities. No change from the prior study Electronically Signed   By: Amie Portlandavid  Ormond M.D.   On: 12/31/2015 15:16   I have personally reviewed and evaluated these images and lab results as part of my medical decision-making.   EKG Interpretation None      MDM   Final diagnoses:  UTI (lower urinary tract infection)  Seizure (HCC)    Status? CVA? Will check labs/stroke workup and consult neurology.   Improved with keppra load and ativan. Back to baseline. Neuro consulted and ok with discharge and increase in meds.   Has UTI, culture added will tx w/ rocephin here and keflex at home.   New Prescriptions: New Prescriptions   CEPHALEXIN (KEFLEX) 500 MG CAPSULE    Take 1 capsule (500 mg  total) by mouth 4 (four) times daily.    I have personally and contemperaneously reviewed labs and imaging and used in my decision making as above.   A medical screening exam was performed and I feel the patient has had an appropriate workup for their chief complaint at this time and likelihood of emergent condition existing is low. Their vital signs are stable. They have been counseled on decision, discharge, follow up and which symptoms necessitate immediate return to the emergency department.  They verbally stated understanding and agreement with plan and discharged in stable condition.    Marily Memos, MD 12/31/15 [phone]

## 2016-01-02 LAB — LEVETIRACETAM LEVEL: Levetiracetam Lvl: 33.3 ug/mL (ref 10.0–40.0)

## 2016-01-02 LAB — URINE CULTURE: Culture: >=100000 — AB

## 2016-01-03 ENCOUNTER — Telehealth: Payer: Self-pay | Admitting: *Deleted

## 2016-01-03 NOTE — ED Notes (Signed)
Post ED Visit - Positive Culture Follow-up  Culture report reviewed by antimicrobial stewardship pharmacist:  []  Enzo BiNathan Batchelder, Pharm.D. []  Celedonio MiyamotoJeremy Frens, Pharm.D., BCPS []  Garvin FilaMike Maccia, Pharm.D. []  Georgina PillionElizabeth Martin, Pharm.D., BCPS []  Saybrook ManorMinh Pham, [address] BoulevardPharm.D., BCPS, AAHIVP [x]  Estella HuskMichelle Turner, Pharm.D., BCPS, AAHIVP []  Tennis Mustassie Stewart, Pharm.D. []  Sherle Poeob Vincent, [address] BoulevardPharm.D.  Positive urine culture Treated with cephalexin, organism sensitive to the same and no further patient follow-up is required at this time.  Virl AxeRobertson, Keionna [facility] Associational[facility] 01/03/2016, 9:59 AM

## 2016-01-15 ENCOUNTER — Encounter: Payer: Self-pay | Admitting: Adult Health

## 2016-01-15 ENCOUNTER — Non-Acute Institutional Stay (SKILLED_NURSING_FACILITY): Payer: Medicare Other | Admitting: Adult Health

## 2016-01-15 DIAGNOSIS — G2119 Other drug induced secondary parkinsonism: Secondary | ICD-10-CM | POA: Diagnosis not present

## 2016-01-15 DIAGNOSIS — E559 Vitamin D deficiency, unspecified: Secondary | ICD-10-CM

## 2016-01-15 DIAGNOSIS — R32 Unspecified urinary incontinence: Secondary | ICD-10-CM | POA: Diagnosis not present

## 2016-01-15 DIAGNOSIS — F2089 Other schizophrenia: Secondary | ICD-10-CM

## 2016-01-15 DIAGNOSIS — E785 Hyperlipidemia, unspecified: Secondary | ICD-10-CM | POA: Diagnosis not present

## 2016-01-15 DIAGNOSIS — R600 Localized edema: Secondary | ICD-10-CM | POA: Diagnosis not present

## 2016-01-15 DIAGNOSIS — R569 Unspecified convulsions: Secondary | ICD-10-CM | POA: Diagnosis not present

## 2016-01-15 DIAGNOSIS — I10 Essential (primary) hypertension: Secondary | ICD-10-CM | POA: Diagnosis not present

## 2016-01-15 DIAGNOSIS — F29 Unspecified psychosis not due to a substance or known physiological condition: Secondary | ICD-10-CM

## 2016-01-15 LAB — VITAMIN D 1,25 DIHYDROXY: Vit D, 1,25-Dihydroxy: 21

## 2016-01-15 NOTE — Progress Notes (Signed)
[facility]: [name]       Allergies  Allergen Reactions  . Depakote [Valproic Acid] Other (See Comments)    altered mental status/ high ammonia  . Haldol [Haloperidol Lactate] Other (See Comments)    Unknown reaction.    Chief Complaint  Patient presents with  . Medical Management of Chronic Issues    Folllow up    HPI:  She is a long term resident of this facility being seen for the management of her chronic illnesses. Overall there is little change in her status. She does get out of bed daily to her wheelchair. She is unable to participate in the hpi or ros. There are no nursing concerns at this time.    Past Medical History  Diagnosis Date  . Hypertension   . Seizures (HCC)   . Mental retardation   . Hyperlipidemia   . Anxiety   . Vitamin D deficiency   . Obesity   . Dementia   . 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 No pertinent past surgical history.  VITAL SIGNS BP 140/72 mmHg  Pulse 62  Temp(Src) 98.1 F (36.7 C) (Oral)  Resp 18  Ht  (1.651 m)  Wt 200 lb 4 oz (90.833 kg)  BMI 33.32 kg/m2  SpO2 95%  Patient's Medications  New Prescriptions   No medications on file  Previous Medications   ACETAMINOPHEN (TYLENOL) 500 MG TABLET    Take 500 mg by mouth every 4 (four) hours as needed for mild pain or fever.    ATORVASTATIN (LIPITOR) 10 MG TABLET    Take 10 mg by mouth daily.   BENZTROPINE (COGENTIN) 1 MG TABLET    Take 1 mg by mouth 2 (two) times daily.   CEPHALEXIN (KEFLEX) 500 MG CAPSULE    Take 1 capsule (500 mg total) by mouth 4 (four) times daily.   CHLORHEXIDINE (PERIDEX) 0.12 % SOLUTION    Use as directed 20 mLs in the mouth or throat 4 (four) times daily.    CLONAZEPAM (KLONOPIN) 1 MG TABLET    Take 1 mg by mouth daily.   FUROSEMIDE (LASIX) 20 MG TABLET    Take 20 mg by mouth 2 (two) times daily. Reported on 12/31/2015   HYDROCORTISONE (ANUSOL-HC) 2.5 % RECTAL CREAM    Place 1 application rectally 2 (two) times  daily as needed for hemorrhoids or itching.   LEVETIRACETAM (KEPPRA) 750 MG TABLET    Take 2 tablets (1,500 mg total) by mouth every evening.   LISINOPRIL (ZESTRIL) 2.5 MG TABLET    Take 1 tablet (2.5 mg total) by mouth daily.   MAGNESIUM HYDROXIDE (MILK OF MAGNESIA) 400 MG/5ML SUSPENSION    Take 30 mLs by mouth daily as needed for mild constipation.   PHENOBARBITAL (LUMINAL) 64.8 MG TABLET    Take 2 tablets (129.6 mg total) by mouth at bedtime.   QUETIAPINE (SEROQUEL) 50 MG TABLET    Take 50 mg by mouth at bedtime.   THIOTHIXENE (NAVANE) 10 MG CAPSULE    Take 10 mg by mouth at bedtime.    TRIAMCINOLONE CREAM (KENALOG) 0.1 %    Apply 1 application topically 2 (two) times daily.   VITAMIN D, ERGOCALCIFEROL, (DRISDOL) 50000 UNITS CAPS CAPSULE    Take 50,000 Units by mouth every 7 (seven) days. Takes on Saturdays.   VITAMIN E (VITAMIN E) 200 UNIT CAPSULE    Take 400 Units by mouth daily.   Modified Medications   No medications on  file  Discontinued Medications   No medications on file     SIGNIFICANT DIAGNOSTIC EXAMS  05-14-15: chest x-ray: 1. No evidence of acute traumatic injury allowing for low lung volumes and rotation. 2. Mild vascular congestion and bibasilar atelectasis.  05-14-15: ct of head and cervical spine: 1. No acute intracranial abnormality. Cerebellar atrophy is unchanged. 2. No acute fracture or subluxation in the cervical spine.  08-17-15: chest x-ray: Mild left midlung opacity may reflect atelectasis or possibly mild pneumonia, depending on the patient's symptoms. Lungs mildly hypoexpanded  08-29-15: chest x-ray: no acute cardiopulmonary disease      LABS REVIEWED:   05-14-15: wbc 8.1; hgb 12.9; hct 37.2; mcv 86.9; plt 285; glucose 116; bun 11; creat 1.24; k+ 3.5; na++144 05-15-15: wbc 6.9; hgb 11.7; hct 34.;3 mcv 86.8; plt 251; glucose 104; bun 8; creat 0.51; k+ 3.3; na++144; phenobarb 11.9  05-25-15: phenobarb 16.6  06-16-15: chol 146; ldl 79; trig 58; hdl 55; hgb a1c  5.3; vit d 11  08-17-15: wbc 6.4; hgb 11.5; hct 34.;4 mcv 85.1; plt 340; glucose 141; bun 16; creat 0.60; k+ 3.8; na++136; liver normal albumin 3.5; phenobarb 13.0; HIV: nr; urine culture: no growth;  blood culture: no growth  08-20-15: glucose 92; bun 10; creat 0.61; k+ 3.4; na++139 08-21-15: wbc 5.2;hgb 12.1; hct 35.0; mcv 85.0; plt 130  12-13-15: chol 167; ldl 90; trig 134; hdl 50; hgb a1c 5.1 vit D 21     Review of Systems  Unable to perform ROS: Dementia      Physical Exam  Constitutional: No distress.  Obese   Eyes: Conjunctivae are normal.  Neck: Neck supple. No JVD present. No thyromegaly present.  Cardiovascular: Normal rate, regular rhythm and intact distal pulses.   Respiratory: Effort normal and breath sounds normal. No respiratory distress. She has no wheezes.  GI: Soft. Bowel sounds are normal. She exhibits no distension. There is no tenderness.  Musculoskeletal: She exhibits no edema.  Able to move all extremities   Lymphadenopathy:    She has no cervical adenopathy.  Neurological: She is alert.  Skin: Skin is warm and dry. She is not diaphoretic.  Psychiatric: She has a normal mood and affect.       ASSESSMENT/ PLAN:  1. Seizures: no reports of recent seizure activity; will continue keppra 1 gm in the AM and 1500 mg in the PM will continue phenobarbital 129.6 mg nightly her phenobarb level is 13.0   2. Hypertension: will continue  lisinopril  2.5 mg daily   3. Dyslipidemia: will continue lipitor 10 mg daily ldl is 90   4. Vit d deficiency: will continue vit d 50,000 units weekly  Vit D 21   5. Lower extremity edema: will continue lasix 20 mg twice daily   6. UI: is presently not on medications     7. Hemorrhoids: will change anusol twice daily as needed   8. Psychosis: is without change will continue seroquel 25 mg in the AM and  50 mg nightly and will continue navane 10 mg nightly is followed by psych services   9. Secondary parkinson's disease:  will continue cogentin 1 mg twice daily   10. Anxiety: will continue klonopin 1 mg  daily        Synthia Innocenteborah Sharni Negron NP [facility]  Contact [phone] Monday through Friday 8am- 5pm  After hours call [phone]

## 2016-01-17 DIAGNOSIS — R350 Frequency of micturition: Secondary | ICD-10-CM | POA: Diagnosis not present

## 2016-01-17 DIAGNOSIS — I10 Essential (primary) hypertension: Secondary | ICD-10-CM | POA: Diagnosis not present

## 2016-01-17 DIAGNOSIS — G4089 Other seizures: Secondary | ICD-10-CM | POA: Diagnosis not present

## 2016-01-17 LAB — CBC AND DIFFERENTIAL
HEMATOCRIT: 37 % (ref 36–46)
HEMOGLOBIN: 12.4 g/dL (ref 12.0–16.0)
NEUTROS ABS: 4032 /uL
PLATELETS: 325 10*3/uL (ref 150–399)
WBC: 6.4 10^3/mL

## 2016-01-17 LAB — BASIC METABOLIC PANEL
BUN: 20 mg/dL (ref 4–21)
Creatinine: 0.5 mg/dL (ref 0.5–1.1)
Glucose: 72 mg/dL
Potassium: 4.3 mmol/L (ref 3.4–5.3)
SODIUM: 141 mmol/L (ref 137–147)

## 2016-01-17 LAB — HEPATIC FUNCTION PANEL
ALK PHOS: 66 U/L (ref 25–125)
ALT: 9 U/L (ref 7–35)
AST: 10 U/L — AB (ref 13–35)
BILIRUBIN, TOTAL: 0.3 mg/dL

## 2016-01-29 ENCOUNTER — Encounter: Payer: Self-pay | Admitting: Adult Health

## 2016-01-29 ENCOUNTER — Non-Acute Institutional Stay (SKILLED_NURSING_FACILITY): Payer: Medicare Other | Admitting: Adult Health

## 2016-01-29 DIAGNOSIS — R569 Unspecified convulsions: Secondary | ICD-10-CM

## 2016-01-29 NOTE — Progress Notes (Signed)
Patient ID: Christine EagleDebra L Pena, female   DOB: 04/04/1956, 60 y.o.   MRN: [id number]   [facility]     CODE STATUS: DNR  Allergies  Allergen Reactions  . Depakote [Valproic Acid] Other (See Comments)    altered mental status/ high ammonia  . Haldol [Haloperidol Lactate] Other (See Comments)    Unknown reaction.    Chief Complaint  Patient presents with  . Acute Visit    Follow up Seizure    HPI:  She has been seen in the ED for her seizures. The recommendations were made for an outpatient EEG and to be followed up by neurology. She is unable to participate in the hpi or ros. There are no reports of further seizure activity.    Past Medical History  Diagnosis Date  . Hypertension   . Seizures (HCC)   . Mental retardation   . Hyperlipidemia   . Anxiety   . Vitamin D deficiency   . Obesity   . Dementia   . 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 No pertinent past surgical history.  Social History   Social History  . Marital Status: Single    Spouse Name: N/A  . Number of Children: 1  . Years of Education: N/A   Occupational History  . Not on file.   Social History Main Topics  . Smoking status: Never Smoker   . Smokeless tobacco: Never Used  . Alcohol Use: No  . Drug Use: No  . Sexual Activity: Not Currently    Birth Control/ Protection: Post-menopausal   Other Topics Concern  . Not on file   Social History Narrative   Patient drinks about 5 glasses of tea daily.   Family History  Problem Relation Age of Onset  . Heart disease Mother       VITAL SIGNS BP 103/55 mmHg  Pulse 72  Temp(Src) 97.9 F (36.6 C) (Oral)  Resp 18  Ht 5\' 5"  (1.651 m)  Wt 205 lb 4 oz (93.101 kg)  BMI 34.16 kg/m2  SpO2 96%  Patient's Medications  New Prescriptions   No medications on file  Previous Medications   ACETAMINOPHEN (TYLENOL) 500 MG TABLET    Take 500 mg by mouth every 4 (four) hours as needed for mild pain or fever.    ATORVASTATIN (LIPITOR) 10 MG TABLET    Take 10 mg by mouth daily.   BENZTROPINE (COGENTIN) 1 MG TABLET    Take 1 mg by mouth 2 (two) times daily.   CEPHALEXIN (KEFLEX) 500 MG CAPSULE    Take 1 capsule (500 mg total) by mouth 4 (four) times daily.   CHLORHEXIDINE (PERIDEX) 0.12 % SOLUTION    Use as directed 20 mLs in the mouth or throat 4 (four) times daily.    CLONAZEPAM (KLONOPIN) 1 MG TABLET    Take 1 mg by mouth daily.   FUROSEMIDE (LASIX) 20 MG TABLET    Take 20 mg by mouth 2 (two) times daily. Reported on 12/31/2015   HYDROCORTISONE (ANUSOL-HC) 2.5 % RECTAL CREAM    Place 1 application rectally 2 (two) times daily as needed for hemorrhoids or itching.   LEVETIRACETAM (KEPPRA) 750 MG TABLET    Take 2 tablets (1,500 mg total) by mouth every evening.   LISINOPRIL (ZESTRIL) 2.5 MG TABLET    Take 1 tablet (2.5 mg total) by mouth daily.   MAGNESIUM HYDROXIDE (MILK OF MAGNESIA) 400 MG/5ML SUSPENSION    Take 30 mLs by  mouth daily as needed for mild constipation.   PHENOBARBITAL (LUMINAL) 64.8 MG TABLET    Take 2 tablets (129.6 mg total) by mouth at bedtime.   QUETIAPINE (SEROQUEL) 50 MG TABLET    Take 50 mg by mouth at bedtime.   THIOTHIXENE (NAVANE) 10 MG CAPSULE    Take 10 mg by mouth at bedtime.    TRIAMCINOLONE CREAM (KENALOG) 0.1 %    Apply 1 application topically 2 (two) times daily.   VITAMIN D, ERGOCALCIFEROL, (DRISDOL) 50000 UNITS CAPS CAPSULE    Take 50,000 Units by mouth every 7 (seven) days. Takes on Saturdays.   VITAMIN E (VITAMIN E) 200 UNIT CAPSULE    Take 400 Units by mouth daily.   Modified Medications   No medications on file  Discontinued Medications   No medications on file     SIGNIFICANT DIAGNOSTIC EXAMS  05-14-15: chest x-ray: 1. No evidence of acute traumatic injury allowing for low lung volumes and rotation. 2. Mild vascular congestion and bibasilar atelectasis.  05-14-15: ct of head and cervical spine: 1. No acute intracranial abnormality. Cerebellar atrophy  is unchanged. 2. No acute fracture or subluxation in the cervical spine.  08-17-15: chest x-ray: Mild left midlung opacity may reflect atelectasis or possibly mild pneumonia, depending on the patient's symptoms. Lungs mildly hypoexpanded  08-29-15: chest x-ray: no acute cardiopulmonary disease      LABS REVIEWED:   05-14-15: wbc 8.1; hgb 12.9; hct 37.2; mcv 86.9; plt 285; glucose 116; bun 11; creat 1.24; k+ 3.5; na++144 05-15-15: wbc 6.9; hgb 11.7; hct 34.;3 mcv 86.8; plt 251; glucose 104; bun 8; creat 0.51; k+ 3.3; na++144; phenobarb 11.9  05-25-15: phenobarb 16.6  06-16-15: chol 146; ldl 79; trig 58; hdl 55; hgb a1c 5.3; vit d 11  08-17-15: wbc 6.4; hgb 11.5; hct 34.;4 mcv 85.1; plt 340; glucose 141; bun 16; creat 0.60; k+ 3.8; na++136; liver normal albumin 3.5; phenobarb 13.0; HIV: nr; urine culture: no growth;  blood culture: no growth  08-20-15: glucose 92; bun 10; creat 0.61; k+ 3.4; na++139 08-21-15: wbc 5.2;hgb 12.1; hct 35.0; mcv 85.0; plt 130  12-13-15: chol 167; ldl 90; trig 134; hdl 50; hgb a1c 5.1 vit D 21  01-17-16: wbc 6.4 hgb 12.4 hct 36.9; mcv 85.2; plt 325; glucose 72; bun 20; creat 0.51; k+ 4.3; na++141; liver normal albumin 3.9; phenobarb 19.1     Review of Systems  Unable to perform ROS: Dementia      Physical Exam  Constitutional: No distress.  Obese   Eyes: Conjunctivae are normal.  Neck: Neck supple. No JVD present. No thyromegaly present.  Cardiovascular: Normal rate, regular rhythm and intact distal pulses.   Respiratory: Effort normal and breath sounds normal. No respiratory distress. She has no wheezes.  GI: Soft. Bowel sounds are normal. She exhibits no distension. There is no tenderness.  Musculoskeletal: She exhibits no edema.  Able to move all extremities   Lymphadenopathy:    She has no cervical adenopathy.  Neurological: She is alert.  Skin: Skin is warm and dry. She is not diaphoretic.  Psychiatric: She has a normal mood and affect.        ASSESSMENT/ PLAN:  1. Seizures: no reports of further  seizure activity; will continue keppra 1 gm in the AM and 1500 mg in the PM will continue phenobarbital 129.6 mg nightly her phenobarb level is 19.1  Will begin ativan 1 mg IM as needed for seizures Will setup an outpatient EEG  Will setup  a neurology consult to follow up for her seizure management.       Synthia Innocent NP [facility]  Contact [phone] Monday through Friday 8am- 5pm  After hours call [phone]

## 2016-02-14 ENCOUNTER — Encounter: Payer: Self-pay | Admitting: Neurology

## 2016-02-14 ENCOUNTER — Ambulatory Visit (INDEPENDENT_AMBULATORY_CARE_PROVIDER_SITE_OTHER): Payer: Medicare Other | Admitting: Neurology

## 2016-02-14 VITALS — BP 122/69 | HR 74

## 2016-02-14 DIAGNOSIS — F79 Unspecified intellectual disabilities: Secondary | ICD-10-CM

## 2016-02-14 DIAGNOSIS — Z5181 Encounter for therapeutic drug level monitoring: Secondary | ICD-10-CM

## 2016-02-14 DIAGNOSIS — G2119 Other drug induced secondary parkinsonism: Secondary | ICD-10-CM

## 2016-02-14 DIAGNOSIS — R569 Unspecified convulsions: Secondary | ICD-10-CM

## 2016-02-14 NOTE — Patient Instructions (Signed)

## 2016-02-14 NOTE — Progress Notes (Signed)
Reason for visit: Seizures  Christine Pena is an 60 y.o. female  History of present illness:  Christine Pena is a 60 year old white female with a history of mental retardation, and a history of seizures. 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 The patient remains on thiothixene, Seroquel, and Cogentin. She has been treated with phenobarbital and Keppra, she was last in the emergency room on 12/31/2015 with a seizure event that was witnessed. The patient underwent a CT scan of the head that was unremarkable. Keppra levels were done and were in the therapeutic range, phenobarbital levels were not done. The patient was discharged back to the extended care facility. The patient comes into the office today with a transport person who knows nothing about the patient herself. The patient is confused, minimally verbal. The patient is nonambulatory. The notes from [facility] did not indicate any further seizures since April 9. The last visit with her doctor at the extended care facility was on 01/29/2016. She returns for further evaluation.  Past Medical History  Diagnosis Date  . Hypertension   . Seizures (HCC)   . Mental retardation   . Hyperlipidemia   . Anxiety   . Vitamin D deficiency   . Obesity   . Dementia   . 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 No pertinent past surgical history.  Family History  Problem Relation Age of Onset  . Heart disease Mother     Social history:  reports that she has never smoked. She has never used smokeless tobacco. She reports that she does not drink alcohol or use illicit drugs.    Allergies  Allergen Reactions  . Depakote [Valproic Acid] Other (See Comments)    altered mental status/ high ammonia  . Haldol [Haloperidol Lactate] Other (See Comments)    Unknown reaction.    Medications:  Prior to Admission  medications   Medication Sig Start Date End Date Taking? Authorizing Provider  acetaminophen (TYLENOL) 500 MG tablet Take 500 mg by mouth every 4 (four) hours as needed for mild pain or fever.     Historical Provider, MD  atorvastatin (LIPITOR) 10 MG tablet Take 10 mg by mouth daily.    Historical Provider, MD  benztropine (COGENTIN) 1 MG tablet Take 1 mg by mouth 2 (two) times daily.    Historical Provider, MD  chlorhexidine (PERIDEX) 0.12 % solution Use as directed 20 mLs in the mouth or throat 4 (four) times daily.     Historical Provider, MD  clonazePAM (KLONOPIN) 1 MG tablet Take 1 mg by mouth daily.    Historical Provider, MD  furosemide (LASIX) 20 MG tablet Take 20 mg by mouth 2 (two) times daily. Reported on 12/31/2015    Historical Provider, MD  hydrocortisone (ANUSOL-HC) 2.5 % rectal cream Place 1 application rectally 2 (two) times daily as needed for hemorrhoids or itching. 11/13/15   Christine Holstereborah S Green, NP  levETIRAcetam (KEPPRA) 750 MG tablet Take 2 tablets (1,500 mg total) by mouth every evening. 12/31/15   Christine MemosJason Mesner, MD  lisinopril (ZESTRIL) 2.5 MG tablet Take 1 tablet (2.5 mg total) by mouth daily. 11/13/15   Christine Holstereborah S Green, NP  magnesium hydroxide (MILK OF MAGNESIA) 400 MG/5ML suspension Take 30 mLs by mouth daily as needed for mild constipation.    Historical Provider, MD  PHENobarbital (LUMINAL) 64.8 MG tablet Take 2 tablets (129.6 mg total)  by mouth at bedtime. 05/19/15   Kirt Boys, DO  QUEtiapine (SEROQUEL) 50 MG tablet Take 50 mg by mouth at bedtime.    Historical Provider, MD  thiothixene (NAVANE) 10 MG capsule Take 10 mg by mouth at bedtime.     Historical Provider, MD  triamcinolone cream (KENALOG) 0.1 % Apply 1 application topically 2 (two) times daily.    Historical Provider, MD  Vitamin D, Ergocalciferol, (DRISDOL) 50000 UNITS CAPS capsule Take 50,000 Units by mouth every 7 (seven) days. Takes on Saturdays.    Historical Provider, MD  vitamin E (VITAMIN E) 200 UNIT capsule  Take 400 Units by mouth daily.     Historical Provider, MD    ROS:  Out of a complete 14 system review of symptoms, the patient complains only of the following symptoms, and all other reviewed systems are negative.  Seizures Mental retardation Gait disorder Tremors  Blood pressure 122/69, pulse 74.  Physical Exam  General: The patient is alert and cooperative at the time of the examination. The patient is markedly obese.  Skin: 1+ edema of ankles is noted bilaterally.   Neurologic Exam  Mental status: The patient is alert and will cooperate some, she is oriented to her name only.   Cranial nerves: Facial symmetry is present. Speech is significantly dysarthric, almost unintelligible. Extraocular movements are full. Visual fields are full to threat.  Motor: The patient has good strength in all 4 extremities. Cogwheel rigidity is noted on all 4 extremities.   Sensory examination: Soft touch sensation is symmetric on the face, arms, and legs.  Coordination: The patient has difficulty performing finger-nose-finger and heel-to-shin on both sides, she is able to perform finger-nose-finger a bit better.  Gait and station: The patient requires full assistance with standing. Once up, the patient does not appear to be able to bear weight independently. She is unable to ambulate.  Reflexes: Deep tendon reflexes are symmetric, but are depressed.   CT head 12/31/15:  IMPRESSION: 1. No acute intracranial abnormalities. No change from the prior Study  * CT scan images were reviewed online. I agree with the written report.    Assessment/Plan:  1. Mental retardation  2. Seizures with recent recurrence   3. Secondary parkinsonism   4. Gait disorder   The patient will be sent for blood work today to check a Keppra level and phenobarbital level. The current doses of phenobarbital and Keppra will be maintained. The patient will follow-up in 6 months, sooner if needed. An EEG study  will be ordered. Once again, I would recommend that the thiothixene be tapered off to improve the features of secondary parkinsonism.   Marlan Palau MD 02/14/2016 8:20 AM  [facility] [address] [place], [place] [address]  Phone [phone] Fax [phone]

## 2016-02-15 ENCOUNTER — Non-Acute Institutional Stay (SKILLED_NURSING_FACILITY): Payer: Medicare Other | Admitting: Adult Health

## 2016-02-15 ENCOUNTER — Encounter: Payer: Self-pay | Admitting: Adult Health

## 2016-02-15 DIAGNOSIS — R600 Localized edema: Secondary | ICD-10-CM

## 2016-02-15 DIAGNOSIS — R569 Unspecified convulsions: Secondary | ICD-10-CM

## 2016-02-15 DIAGNOSIS — E559 Vitamin D deficiency, unspecified: Secondary | ICD-10-CM | POA: Diagnosis not present

## 2016-02-15 DIAGNOSIS — F29 Unspecified psychosis not due to a substance or known physiological condition: Secondary | ICD-10-CM

## 2016-02-15 DIAGNOSIS — F2089 Other schizophrenia: Secondary | ICD-10-CM | POA: Diagnosis not present

## 2016-02-15 DIAGNOSIS — E785 Hyperlipidemia, unspecified: Secondary | ICD-10-CM | POA: Diagnosis not present

## 2016-02-15 DIAGNOSIS — I10 Essential (primary) hypertension: Secondary | ICD-10-CM | POA: Diagnosis not present

## 2016-02-15 DIAGNOSIS — R32 Unspecified urinary incontinence: Secondary | ICD-10-CM | POA: Diagnosis not present

## 2016-02-15 DIAGNOSIS — G2119 Other drug induced secondary parkinsonism: Secondary | ICD-10-CM

## 2016-02-15 NOTE — Progress Notes (Signed)
Location:   [name]    Place of Service:   SNF    CODE STATUS: DNR  Allergies  Allergen Reactions  . Depakote [Valproic Acid] Other (See Comments)    altered mental status/ high ammonia  . Haldol [Haloperidol Lactate] Other (See Comments)    Unknown reaction.    Chief Complaint  Patient presents with  . Medical Management of Chronic Issues    Follow up    HPI:  She is a long term resident of this facility being seen for the management of her chronic illnesses. She has not had any further seizures. She has been seen by neurology; who recommend her coming off the navane to help manage her secondary parkinson symptoms. I have spoken with the psych nurse practitioner who agrees with me that her emotional health will be greatly harmed if this medication is removed from her regimen; at the benefits outweigh the risks of this medication. She is going out of her room and does engage socially with others. She is unable to fully participate in the hpi or ros. There are no nursing concerns at this time.    Past Medical History  Diagnosis Date  . Hypertension   . Seizures (HCC)   . Mental retardation   . Hyperlipidemia   . Anxiety   . Vitamin D deficiency   . Obesity   . Dementia   . 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 No pertinent past surgical history.  Social History   Social History  . Marital Status: Single    Spouse Name: N/A  . Number of Children: 1  . Years of Education: N/A   Occupational History  . Not on file.   Social History Main Topics  . Smoking status: Never Smoker   . Smokeless tobacco: Never Used  . Alcohol Use: No  . Drug Use: No  . Sexual Activity: Not Currently    Birth Control/ Protection: Post-menopausal   Other Topics Concern  . Not on file   Social History Narrative   Patient drinks about 5 glasses of tea daily.   Family History  Problem Relation Age of Onset  . Heart disease Mother       VITAL  SIGNS BP 110/66 mmHg  Pulse 68  Temp(Src) 97.1 F (36.2 C) (Oral)  Resp 18  Ht 5\' 5"  (1.651 m)  Wt 205 lb (92.987 kg)  BMI 34.11 kg/m2  SpO2 97%  Patient's Medications  New Prescriptions   No medications on file  Previous Medications   ACETAMINOPHEN (TYLENOL) 500 MG TABLET    Take 500 mg by mouth every 4 (four) hours as needed for mild pain or fever.    ATORVASTATIN (LIPITOR) 10 MG TABLET    Take 10 mg by mouth daily.   BENZTROPINE (COGENTIN) 1 MG TABLET    Take 1 mg by mouth 2 (two) times daily.   CHLORHEXIDINE (PERIDEX) 0.12 % SOLUTION    Use as directed 20 mLs in the mouth or throat 4 (four) times daily.    CLONAZEPAM (KLONOPIN) 1 MG TABLET    Take 1 mg by mouth daily.   FUROSEMIDE (LASIX) 20 MG TABLET    Take 20 mg by mouth 2 (two) times daily. Reported on 12/31/2015   HYDROCORTISONE (ANUSOL-HC) 2.5 % RECTAL CREAM    Place 1 application rectally 2 (two) times daily as needed for hemorrhoids or itching.   LEVETIRACETAM (KEPPRA) 750 MG TABLET    Take 2 tablets (1,500  mg total) by mouth twice daily    LISINOPRIL (ZESTRIL) 2.5 MG TABLET    Take 1 tablet (2.5 mg total) by mouth daily.   LORAZEPAM IJ    Inject 1 mg as directed. Inject 1 mg IM every 15 min prn active seizures. Give 1 mg IM every 15 min x 3 PRN for active seizures.   PHENOBARBITAL (LUMINAL) 64.8 MG TABLET    Take 2 tablets (129.6 mg total) by mouth at bedtime.   QUETIAPINE (SEROQUEL) 50 MG TABLET    Take 50 mg by mouth at bedtime. Take 25 mg po QD at 1400   THIOTHIXENE (NAVANE) 10 MG CAPSULE    Take 10 mg by mouth at bedtime.    TRIAMCINOLONE CREAM (KENALOG) 0.1 %    Apply 1 application topically 2 (two) times daily.   VITAMIN D, ERGOCALCIFEROL, (DRISDOL) 50000 UNITS CAPS CAPSULE    Take 50,000 Units by mouth every 7 (seven) days. Takes on Saturdays.   VITAMIN E (VITAMIN E) 200 UNIT CAPSULE    Take 400 Units by mouth daily.   Modified Medications   No medications on file  Discontinued Medications   MAGNESIUM HYDROXIDE  (MILK OF MAGNESIA) 400 MG/5ML SUSPENSION    Take 30 mLs by mouth daily as needed for mild constipation. Reported on 02/15/2016     SIGNIFICANT DIAGNOSTIC EXAMS  05-14-15: chest x-ray: 1. No evidence of acute traumatic injury allowing for low lung volumes and rotation. 2. Mild vascular congestion and bibasilar atelectasis.  05-14-15: ct of head and cervical spine: 1. No acute intracranial abnormality. Cerebellar atrophy is unchanged. 2. No acute fracture or subluxation in the cervical spine.  08-17-15: chest x-ray: Mild left midlung opacity may reflect atelectasis or possibly mild pneumonia, depending on the patient's symptoms. Lungs mildly hypoexpanded  08-29-15: chest x-ray: no acute cardiopulmonary disease      LABS REVIEWED:   05-14-15: wbc 8.1; hgb 12.9; hct 37.2; mcv 86.9; plt 285; glucose 116; bun 11; creat 1.24; k+ 3.5; na++144 05-15-15: wbc 6.9; hgb 11.7; hct 34.;3 mcv 86.8; plt 251; glucose 104; bun 8; creat 0.51; k+ 3.3; na++144; phenobarb 11.9  05-25-15: phenobarb 16.6  06-16-15: chol 146; ldl 79; trig 58; hdl 55; hgb a1c 5.3; vit d 11  08-17-15: wbc 6.4; hgb 11.5; hct 34.;4 mcv 85.1; plt 340; glucose 141; bun 16; creat 0.60; k+ 3.8; na++136; liver normal albumin 3.5; phenobarb 13.0; HIV: nr; urine culture: no growth;  blood culture: no growth  08-20-15: glucose 92; bun 10; creat 0.61; k+ 3.4; na++139 08-21-15: wbc 5.2;hgb 12.1; hct 35.0; mcv 85.0; plt 130  12-13-15: chol 167; ldl 90; trig 134; hdl 50; hgb a1c 5.1 vit D 21  01-17-16: wbc 6.4 hgb 12.4 hct 36.9; mcv 85.2; plt 325; glucose 72; bun 20; creat 0.51; k+ 4.3; na++141; liver normal albumin 3.9; phenobarb 19.1  02-14-16: phenobarbital 22    Review of Systems  Unable to perform ROS: Dementia      Physical Exam  Constitutional: No distress.  Obese   Eyes: Conjunctivae are normal.  Neck: Neck supple. No JVD present. No thyromegaly present.  Cardiovascular: Normal rate, regular rhythm and intact distal pulses.     Respiratory: Effort normal and breath sounds normal. No respiratory distress. She has no wheezes.  GI: Soft. Bowel sounds are normal. She exhibits no distension. There is no tenderness.  Musculoskeletal: She exhibits no edema.  Able to move all extremities   Lymphadenopathy:    She has no cervical adenopathy.  Neurological: She is alert.  Skin: Skin is warm and dry. She is not diaphoretic.  Psychiatric: She has a normal mood and affect.       ASSESSMENT/ PLAN:  1. Seizures: no reports of recent seizure activity; will continue keppra 1500 mg twice daily  will continue phenobarbital 129.6 mg nightly her phenobarb level is 22   2. Hypertension: will continue  lisinopril  2.5 mg daily   3. Dyslipidemia: will continue lipitor 10 mg daily ldl is 90   4. Vit d deficiency: will continue vit d 50,000 units weekly  Vit D 21   5. Lower extremity edema: will continue lasix 20 mg twice daily   6. UI: is presently not on medications   Has not had a recent infection   7. Hemorrhoids: will continue anusol twice daily as needed   8. Psychosis: is without change will continue seroquel 25 mg at 2 pm  and  50 mg nightly and will continue navane 10 mg nightly is followed by psych services.  She continues to receive benefits from this regimen; any adjustments could cause her psychological harm. She is able to socialize with others at this time.   9. Secondary parkinson's disease: will continue cogentin 1 mg twice daily   10. Anxiety: will continue klonopin 1 mg  daily       Synthia Innocent NP [facility]  Contact [phone] Monday through Friday 8am- 5pm  After hours call [phone]

## 2016-02-16 ENCOUNTER — Telehealth: Payer: Self-pay

## 2016-02-16 LAB — PHENOBARBITAL LEVEL: PHENOBARBITAL, SERUM: 22 ug/mL (ref 15–40)

## 2016-02-16 LAB — LEVETIRACETAM LEVEL: LEVETIRACETAM: 20.3 ug/mL (ref 10.0–40.0)

## 2016-02-16 NOTE — Telephone Encounter (Signed)
Spoke to ConwayMike, pt's son. Reviewed normal results and instructed to continue seizure medications at current dose. Verbalized understanding and appreciation for call.

## 2016-02-16 NOTE — Telephone Encounter (Signed)
Called home # listed and spoke w/ staff at group home. Stated that pt is no longer there.

## 2016-02-16 NOTE — Telephone Encounter (Signed)
-----   Message from York Spanielharles K Willis, MD sent at 02/16/2016 12:46 PM EDT ----- Therapeutic levels of Keppra and phenobarbital, no change in dosing for now. Please call the patient. ----- Message -----    From: [facility] Lab Results In Interface    Sent: 02/15/2016   7:44 AM      To: York Spanielharles K Willis, MD

## 2016-02-16 NOTE — Telephone Encounter (Deleted)
-----   Message from [name] Willis, MD sent at 02/16/2016 12:46 PM EDT ----- Therapeutic levels of Keppra and phenobarbital, no change in dosing for now. Please call the patient. ----- Message -----    From: [facility] Lab Results In Interface    Sent: 02/15/2016   7:44 AM      To: [name] Willis, MD    

## 2016-03-18 ENCOUNTER — Other Ambulatory Visit: Payer: Self-pay

## 2016-03-18 ENCOUNTER — Non-Acute Institutional Stay (SKILLED_NURSING_FACILITY): Payer: Medicare Other | Admitting: Adult Health

## 2016-03-18 ENCOUNTER — Encounter: Payer: Self-pay | Admitting: Adult Health

## 2016-03-18 ENCOUNTER — Emergency Department (HOSPITAL_COMMUNITY)
Admission: EM | Admit: 2016-03-18 | Discharge: 2016-03-18 | Disposition: A | Discharge status: Home / Self Care | Payer: Medicare Other | Attending: Emergency Medicine | Admitting: Emergency Medicine

## 2016-03-18 ENCOUNTER — Encounter (HOSPITAL_COMMUNITY): Payer: Self-pay | Admitting: Emergency Medicine

## 2016-03-18 DIAGNOSIS — R569 Unspecified convulsions: Secondary | ICD-10-CM | POA: Diagnosis not present

## 2016-03-18 DIAGNOSIS — G40909 Epilepsy, unspecified, not intractable, without status epilepticus: Secondary | ICD-10-CM | POA: Diagnosis not present

## 2016-03-18 DIAGNOSIS — Z79899 Other long term (current) drug therapy: Secondary | ICD-10-CM | POA: Diagnosis not present

## 2016-03-18 DIAGNOSIS — G3183 Dementia with Lewy bodies: Secondary | ICD-10-CM | POA: Insufficient documentation

## 2016-03-18 DIAGNOSIS — E785 Hyperlipidemia, unspecified: Secondary | ICD-10-CM | POA: Diagnosis not present

## 2016-03-18 DIAGNOSIS — I10 Essential (primary) hypertension: Secondary | ICD-10-CM | POA: Insufficient documentation

## 2016-03-18 DIAGNOSIS — G40409 Other generalized epilepsy and epileptic syndromes, not intractable, without status epilepticus: Secondary | ICD-10-CM | POA: Diagnosis not present

## 2016-03-18 LAB — BASIC METABOLIC PANEL
Anion gap: 8 (ref 5–15)
BUN: 16 mg/dL (ref 6–20)
CALCIUM: 9.4 mg/dL (ref 8.9–10.3)
CHLORIDE: 104 mmol/L (ref 101–111)
CO2: 27 mmol/L (ref 22–32)
CREATININE: 0.46 mg/dL (ref 0.44–1.00)
Glucose, Bld: 107 mg/dL — ABNORMAL HIGH (ref 65–99)
Potassium: 3.8 mmol/L (ref 3.5–5.1)
SODIUM: 139 mmol/L (ref 135–145)

## 2016-03-18 LAB — CBC
HCT: 36.6 % (ref 36.0–46.0)
Hemoglobin: 12.7 g/dL (ref 12.0–15.0)
MCH: 29.3 pg (ref 26.0–34.0)
MCHC: 34.7 g/dL (ref 30.0–36.0)
MCV: 84.3 fL (ref 78.0–100.0)
PLATELETS: 301 10*3/uL (ref 150–400)
RBC: 4.34 MIL/uL (ref 3.87–5.11)
RDW: 11.9 % (ref 11.5–15.5)
WBC: 7.5 10*3/uL (ref 4.0–10.5)

## 2016-03-18 LAB — PHENOBARBITAL LEVEL
PHENOBARBITAL LVL: 19.1
PHENOBARBITAL: 23.5 ug/mL (ref 15.0–40.0)

## 2016-03-18 LAB — CBG MONITORING, ED: Glucose-Capillary: 111 mg/dL — ABNORMAL HIGH (ref 65–99)

## 2016-03-18 MED ORDER — SODIUM CHLORIDE 0.9 % IV SOLN
1000.0000 mg | Freq: Once | INTRAVENOUS | Status: AC
  Administered 2016-03-18: 1000 mg via INTRAVENOUS
  Filled 2016-03-18: qty 10

## 2016-03-18 NOTE — ED Notes (Signed)
Seizure pads placed

## 2016-03-18 NOTE — Discharge Instructions (Signed)
Epilepsy °People with epilepsy have times when they shake and jerk uncontrollably (seizures). This happens when there is a sudden change in brain function. Epilepsy may have many possible causes. Anything that disturbs the normal pattern of brain cell activity can lead to seizures. °HOME CARE  °· Follow your doctor's instructions about driving and safety during normal activities. °· Get enough sleep. °· Only take medicine as told by your doctor. °· Avoid things that you know can cause you to have seizures (triggers). °· Write down when your seizures happen and what you remember about each seizure. Write down anything you think may have caused the seizure to happen. °· Tell the people you live and work with that you have seizures. Make sure they know how to help you. They should: °¨ Cushion your head and body. °¨ Turn you on your side. °¨ Not restrain you. °¨ Not place anything inside your mouth. °¨ Call for local emergency medical help if there is any question about what has happened. °· Keep all follow-up visits with your doctor. This is very important. °GET HELP IF: °· You get an infection or start to feel sick. You may have more seizures when you are sick. °· You are having seizures more often. °· Your seizure pattern is changing. °GET HELP RIGHT AWAY IF:  °· A seizure does not stop after a few seconds or minutes. °· A seizure causes you to have trouble breathing. °· A seizure gives you a very bad headache. °· A seizure makes you unable to speak or use a part of your body. °  °This information is not intended to replace advice given to you by your health care provider. Make sure you discuss any questions you have with your health care provider. °  °Document Released: 07/07/2009 Document Revised: 06/30/2013 Document Reviewed: 04/21/2013 °Elsevier Interactive Patient Education ©2016 [employer] ° °

## 2016-03-18 NOTE — ED Provider Notes (Signed)
CSN: [id number]     Arrival date & time 03/18/16  0116 History  By signing my name below, I, Bethel BornBritney McCollum, attest that this documentation has been prepared under the direction and in the presence of Auston Halfmann, MD. Electronically Signed: Bethel BornBritney McCollum, ED Scribe. 03/18/2016. 1:45 AM   Chief Complaint  Patient presents with  . Seizures   LEVEL V CAEAT DUE TO DEMENTIA AND THE CONDITION OF THE PATIENT   Patient is a 60 y.o. female presenting with seizures. The history is provided by the EMS personnel. The history is limited by the condition of the patient. No language interpreter was used.  Seizures Seizure type:  Grand mal Initial focality:  None Episode characteristics: generalized shaking   Severity:  Mild Progression:  Resolved Context: not alcohol withdrawal   Recent head injury:  No recent head injuries PTA treatment:  Midazolam History of seizures: yes    Brought in by EMS from the nursing home, Christine Pena is a 60 y.o. female with PMHx of seizures, dementia, HTN, and HLD who presents to the Emergency Department complaining of a seizure at her facility tonight. Pt was given 5mg  midazolam IM PTA.  Past Medical History  Diagnosis Date  . Hypertension   . Seizures (HCC)   . Mental retardation   . Hyperlipidemia   . Anxiety   . Vitamin D deficiency   . Obesity   . Dementia   . 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 No pertinent past surgical history. Family History  Problem Relation Age of Onset  . Heart disease Mother    Social History  Substance Use Topics  . Smoking status: Never Smoker   . Smokeless tobacco: Never Used  . Alcohol Use: No   OB History    No data available     Review of Systems  Unable to perform ROS: Dementia  Neurological: Positive for seizures.   Allergies  Depakote and Haldol  Home Medications   Prior to Admission medications   Medication Sig Start Date End Date Taking? Authorizing Provider   acetaminophen (TYLENOL) 500 MG tablet Take 500 mg by mouth every 4 (four) hours as needed for mild pain or fever.     Historical Provider, MD  atorvastatin (LIPITOR) 10 MG tablet Take 10 mg by mouth daily.    Historical Provider, MD  benztropine (COGENTIN) 1 MG tablet Take 1 mg by mouth 2 (two) times daily.    Historical Provider, MD  chlorhexidine (PERIDEX) 0.12 % solution Use as directed 20 mLs in the mouth or throat 4 (four) times daily.     Historical Provider, MD  clonazePAM (KLONOPIN) 1 MG tablet Take 1 mg by mouth daily.    Historical Provider, MD  furosemide (LASIX) 20 MG tablet Take 20 mg by mouth 2 (two) times daily. Reported on 12/31/2015    Historical Provider, MD  hydrocortisone (ANUSOL-HC) 2.5 % rectal cream Place 1 application rectally 2 (two) times daily as needed for hemorrhoids or itching. 11/13/15   Sharee Holstereborah S Green, NP  levETIRAcetam (KEPPRA) 750 MG tablet Take 2 tablets (1,500 mg total) by mouth every evening. Patient taking differently: Take 1,500 mg by mouth 2 (two) times daily.  12/31/15   Marily MemosJason Mesner, MD  lisinopril (ZESTRIL) 2.5 MG tablet Take 1 tablet (2.5 mg total) by mouth daily. 11/13/15   Sharee Holstereborah S Green, NP  LORAZEPAM IJ Inject 1 mg as directed. Inject 1 mg IM every 15 min prn active seizures. Give 1 mg  IM every 15 min x 3 PRN for active seizures.    Historical Provider, MD  PHENobarbital (LUMINAL) 64.8 MG tablet Take 2 tablets (129.6 mg total) by mouth at bedtime. 05/19/15   Kirt BoysMonica Carter, DO  QUEtiapine (SEROQUEL) 50 MG tablet Take 50 mg by mouth at bedtime. Take 25 mg po QD at 1400    Historical Provider, MD  thiothixene (NAVANE) 10 MG capsule Take 10 mg by mouth at bedtime.     Historical Provider, MD  triamcinolone cream (KENALOG) 0.1 % Apply 1 application topically 2 (two) times daily.    Historical Provider, MD  Vitamin D, Ergocalciferol, (DRISDOL) 50000 UNITS CAPS capsule Take 50,000 Units by mouth every 7 (seven) days. Takes on Saturdays.    Historical Provider, MD   vitamin E (VITAMIN E) 200 UNIT capsule Take 400 Units by mouth daily.     Historical Provider, MD   BP 127/75 mmHg  Pulse 87  Temp(Src) 98.3 F (36.8 C) (Oral)  Resp 17  SpO2 97% Physical Exam  Constitutional: She appears well-developed and well-nourished. No distress.  HENT:  Head: Normocephalic.  Right Ear: External ear normal.  No battle sign No racoon eyes  Eyes: Pupils are equal, round, and reactive to light.  Neck: Normal range of motion. Neck supple.  Trachea midline No bruit   Cardiovascular: Normal rate and regular rhythm.   2+ DP   Pulmonary/Chest: Effort normal and breath sounds normal. No stridor. She has no wheezes. She has no rales.  Abdominal: Soft. Bowel sounds are normal. She exhibits no mass. There is no tenderness. There is no rebound and no guarding.  Musculoskeletal: Normal range of motion.  All compartments are soft   Neurological: She is alert. She has normal reflexes.  Skin: Skin is warm and dry.  Psychiatric: She has a normal mood and affect. Her behavior is normal.  Nursing note and vitals reviewed.   ED Course  Procedures (including critical care time) Labs Review Labs Reviewed  CBG MONITORING, ED - Abnormal; Notable for the following:    Glucose-Capillary 111 (*)    All other components within normal limits  CBC  PHENOBARBITAL LEVEL  BASIC METABOLIC PANEL    Imaging Review No results found. I have personally reviewed and evaluated these lab results as part of my medical decision-making.   EKG Interpretation   Date/Time:  Monday March 18 2016 01:35:41 EDT Ventricular Rate:  82 PR Interval:    QRS Duration: 106 QT Interval:  380 QTC Calculation: 444 R Axis:   -88 Text Interpretation:  Sinus rhythm RSR' in V1 or V2, probably normal  variant Confirmed by [facility]  MD, Kataleia Quaranta ([id number]) on 03/18/2016 1:37:48  AM      MDM   Final diagnoses:  None    Filed Vitals:   03/18/16 0115 03/18/16 0118  BP: 133/87 127/75  Pulse: 87  87  Temp:  98.3 F (36.8 C)  Resp: 18 17    Results for orders placed or performed during the hospital encounter of 03/18/16  Basic metabolic panel - if new onset seizures  Result Value Ref Range   Sodium 139 135 - 145 mmol/L   Potassium 3.8 3.5 - 5.1 mmol/L   Chloride 104 101 - 111 mmol/L   CO2 27 22 - 32 mmol/L   Glucose, Bld 107 (H) 65 - 99 mg/dL   BUN 16 6 - 20 mg/dL   Creatinine, Ser 4.090.46 0.44 - 1.00 mg/dL   Calcium 9.4 8.9 - 81.110.3 mg/dL  GFR calc non Af Amer >60 >60 mL/min   GFR calc Af Amer >60 >60 mL/min   Anion gap 8 5 - 15  CBC - if new onset seizures  Result Value Ref Range   WBC 7.5 4.0 - 10.5 K/uL   RBC 4.34 3.87 - 5.11 MIL/uL   Hemoglobin 12.7 12.0 - 15.0 g/dL   HCT 29.5 28.4 - 13.2 %   MCV 84.3 78.0 - 100.0 fL   MCH 29.3 26.0 - 34.0 pg   MCHC 34.7 30.0 - 36.0 g/dL   RDW 44.0 10.2 - 72.5 %   Platelets 301 150 - 400 K/uL  CBG monitoring, ED  Result Value Ref Range   Glucose-Capillary 111 (H) 65 - 99 mg/dL   No results found.  Medications  levETIRAcetam (KEPPRA) 1,000 mg in sodium chloride 0.9 % 100 mL IVPB (0 mg Intravenous Stopped 03/18/16 0207)    No seizures in the ED continue seizure medications as directed, follow up with your neurologist.  Strict return precautions given  I personally performed the services described in this documentation, which was scribed in my presence. The recorded information has been reviewed and is accurate.      Cy Blamer, MD 03/18/16 [phone]

## 2016-03-18 NOTE — Progress Notes (Signed)
Patient ID: Christine EagleDebra L Pena, female   DOB: 1956-07-11, 60 y.o.   MRN: [id number]    [facility]    Nursing Home Room Number: 114-A Place of Service:  SNF (31)   CODE STATUS: DNR  Allergies  Allergen Reactions  . Depakote [Valproic Acid] Other (See Comments)    altered mental status/ high ammonia  . Haldol [Haloperidol Lactate] Other (See Comments)    Unknown reaction.    Chief Complaint  Patient presents with  . Acute Visit    ER follow up    HPI:  She was seen in the ED for seizure activity. Her phenobarb level is normal. She is due to EEG in the AM. She is unable to participate in the hpi or ros. She will need to be seen by neurology as a follow up. She is unable to participate in the hpi or ros.    Past Medical History  Diagnosis Date  . Hypertension   . Seizures (HCC)   . Mental retardation   . Hyperlipidemia   . Anxiety   . Vitamin D deficiency   . Obesity   . Dementia   . 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 No pertinent past surgical history.  Social History   Social History  . Marital Status: Single    Spouse Name: N/A  . Number of Children: 1  . Years of Education: N/A   Occupational History  . Not on file.   Social History Main Topics  . Smoking status: Never Smoker   . Smokeless tobacco: Never Used  . Alcohol Use: No  . Drug Use: No  . Sexual Activity: Not Currently    Birth Control/ Protection: Post-menopausal   Other Topics Concern  . Not on file   Social History Narrative   Patient drinks about 5 glasses of tea daily.   Family History  Problem Relation Age of Onset  . Heart disease Mother       VITAL SIGNS BP 106/55 mmHg  Pulse 80  Temp(Src) 97.9 F (36.6 C) (Oral)  Resp 16  Ht 5\' 5"  (1.651 m)  Wt 208 lb 2 oz (94.405 kg)  BMI 34.63 kg/m2  SpO2 100%  Patient's Medications  New Prescriptions   No medications on file  Previous Medications   ACETAMINOPHEN  (TYLENOL) 500 MG TABLET    Take 500 mg by mouth every 4 (four) hours as needed for mild pain or fever.    ATORVASTATIN (LIPITOR) 10 MG TABLET    Take 10 mg by mouth daily.   BENZTROPINE (COGENTIN) 1 MG TABLET    Take 1 mg by mouth 2 (two) times daily.   CHLORHEXIDINE (PERIDEX) 0.12 % SOLUTION    Use as directed 20 mLs in the mouth or throat 4 (four) times daily.    CLONAZEPAM (KLONOPIN) 1 MG TABLET    Take 1 mg by mouth daily. Give 0.5 mg by mouth every 24 hours prn for anxiety   FUROSEMIDE (LASIX) 20 MG TABLET    Take 20 mg by mouth 2 (two) times daily. Reported on 12/31/2015   HYDROCORTISONE (ANUSOL-HC) 2.5 % RECTAL CREAM    Place 1 application rectally 2 (two) times daily as needed for hemorrhoids or itching.   LEVETIRACETAM (KEPPRA) 750 MG TABLET    Take 2 tablets (1,500 mg total) by mouth every evening.   LISINOPRIL (ZESTRIL) 2.5 MG TABLET    Take 1 tablet (2.5 mg total) by mouth daily.  LORAZEPAM IJ    Inject 1 mg as directed. Inject 1 mg IM every 15 min prn active seizures. Give 1 mg IM every 15 min x 3 PRN for active seizures.   PHENOBARBITAL (LUMINAL) 64.8 MG TABLET    Take 2 tablets (129.6 mg total) by mouth at bedtime.   QUETIAPINE (SEROQUEL) 25 MG TABLET    Take 25 mg by mouth daily. For psychosis take at 1400   QUETIAPINE (SEROQUEL) 50 MG TABLET    Take 50 mg by mouth at bedtime. Take 25 mg po QD at 1400   THIOTHIXENE (NAVANE) 10 MG CAPSULE    Take 10 mg by mouth at bedtime.    TRIAMCINOLONE CREAM (KENALOG) 0.1 %    Apply 1 application topically 2 (two) times daily.   VITAMIN D, ERGOCALCIFEROL, (DRISDOL) 50000 UNITS CAPS CAPSULE    Take 50,000 Units by mouth every 7 (seven) days. Takes on Saturdays.   VITAMIN E (VITAMIN E) 200 UNIT CAPSULE    Take 400 Units by mouth daily.   Modified Medications   No medications on file  Discontinued Medications   No medications on file     SIGNIFICANT DIAGNOSTIC EXAMS  05-14-15: chest x-ray: 1. No evidence of acute traumatic injury allowing for  low lung volumes and rotation. 2. Mild vascular congestion and bibasilar atelectasis.  05-14-15: ct of head and cervical spine: 1. No acute intracranial abnormality. Cerebellar atrophy is unchanged. 2. No acute fracture or subluxation in the cervical spine.  08-17-15: chest x-ray: Mild left midlung opacity may reflect atelectasis or possibly mild pneumonia, depending on the patient's symptoms. Lungs mildly hypoexpanded  08-29-15: chest x-ray: no acute cardiopulmonary disease      LABS REVIEWED:   05-14-15: wbc 8.1; hgb 12.9; hct 37.2; mcv 86.9; plt 285; glucose 116; bun 11; creat 1.24; k+ 3.5; na++144 05-15-15: wbc 6.9; hgb 11.7; hct 34.;3 mcv 86.8; plt 251; glucose 104; bun 8; creat 0.51; k+ 3.3; na++144; phenobarb 11.9  05-25-15: phenobarb 16.6  06-16-15: chol 146; ldl 79; trig 58; hdl 55; hgb a1c 5.3; vit d 11  08-17-15: wbc 6.4; hgb 11.5; hct 34.;4 mcv 85.1; plt 340; glucose 141; bun 16; creat 0.60; k+ 3.8; na++136; liver normal albumin 3.5; phenobarb 13.0; HIV: nr; urine culture: no growth;  blood culture: no growth  08-20-15: glucose 92; bun 10; creat 0.61; k+ 3.4; na++139 08-21-15: wbc 5.2;hgb 12.1; hct 35.0; mcv 85.0; plt 130  12-13-15: chol 167; ldl 90; trig 134; hdl 50; hgb a1c 5.1 vit D 21  01-17-16: wbc 6.4 hgb 12.4 hct 36.9; mcv 85.2; plt 325; glucose 72; bun 20; creat 0.51; k+ 4.3; na++141; liver normal albumin 3.9; phenobarb 19.1  02-14-16: phenobarbital 22 03-18-16: wbc 7.5; hgb 12.7; hct 36.6; mcv 84.3; plt 310; glucose 107; bun 16; creat 0.46; k+ 3.8; na++ 139; phenobarb 23.5     Review of Systems  Unable to perform ROS: Dementia      Physical Exam  Constitutional: No distress.  Obese   Eyes: Conjunctivae are normal.  Neck: Neck supple. No JVD present. No thyromegaly present.  Cardiovascular: Normal rate, regular rhythm and intact distal pulses.   Respiratory: Effort normal and breath sounds normal. No respiratory distress. She has no wheezes.  GI: Soft. Bowel sounds  are normal. She exhibits no distension. There is no tenderness.  Musculoskeletal: She exhibits no edema.  Able to move all extremities   Lymphadenopathy:    She has no cervical adenopathy.  Neurological: She is alert.  Skin: Skin is warm and dry. She is not diaphoretic.  Psychiatric: She has a normal mood and affect.       ASSESSMENT/ PLAN:  1. Seizures:  will continue keppra 1500 mg twice daily  will continue phenobarbital 129.6 mg nightly her phenobarb level is 23.5      Synthia Innocenteborah Riane Rung NP [facility]  Contact [phone] Monday through Friday 8am- 5pm  After hours call [phone]

## 2016-03-18 NOTE — ED Notes (Signed)
CBG 111 

## 2016-03-18 NOTE — ED Notes (Signed)
[facility] here to transport [facility].

## 2016-03-18 NOTE — ED Notes (Signed)
Brought in by EMS from nursing home, per EMS reported when they arrived in nursing home patient is currently having seizure like activity, EMS given 5mg  midazolam IM then patient went back to baseline.

## 2016-03-19 ENCOUNTER — Telehealth: Payer: Self-pay | Admitting: Neurology

## 2016-03-19 ENCOUNTER — Ambulatory Visit (INDEPENDENT_AMBULATORY_CARE_PROVIDER_SITE_OTHER): Payer: Medicare Other | Admitting: Neurology

## 2016-03-19 DIAGNOSIS — R569 Unspecified convulsions: Secondary | ICD-10-CM | POA: Diagnosis not present

## 2016-03-19 MED ORDER — LEVETIRACETAM 750 MG PO TABS: 1500.0000 mg | ORAL_TABLET | Freq: Two times a day (BID) | ORAL | Status: AC

## 2016-03-19 NOTE — Telephone Encounter (Signed)
I called the facility concerning the patient. The EEG study showed mild diffuse slowing, no epileptiform activity. The patient is on Keppra taking 1500 mg twice daily. She is also taking phenobarbital, therapy levels of been noted with both of these medications. She was in the emergency room yesterday with a seizure. I will get a revisit in the next week or 2, we may need to add a third seizure medication. May consider Zonegran.

## 2016-03-19 NOTE — Procedures (Signed)
     History: Conception OmsDebra Mccomber is a 60 year old patient with a history of mental retardation and a history of seizures. The patient had a visit to the emergency room on 12/31/2015 with a witnessed seizure episode. The patient has been treated with Keppra. She is being evaluated for the seizures.  This is a routine EEG. No skull defects are noted. Medications include Lipitor, Cogentin, clonazepam, Lasix, Keppra, lisinopril, phenobarbital, Seroquel, Navane, vitamin D, and vitamin E.  EEG classification: Dysrhythmia grade 1 generalized  Description of the recording: The background rhythms of this recording consists of a relatively well-modulated medium amplitude rhythm of 7 Hz that is reactive to eye opening and closure. As the record progresses, photic stimulation and hyperventilation were not performed. There is significant head movement artifact and muscle artifact throughout the recording. At no time does there appear to be evidence of spike or spike-wave discharges or evidence of focal slowing. EKG monitor shows no evidence of cardiac rhythm abnormalities with a heart rate of 78.  Impression: This is an abnormal EEG recording secondary to mild diffuse background slowing. This is a nonspecific recording, and can be seen with any process that results in a mild toxic or metabolic encephalopathy or any dementing illness. No epileptiform discharges were seen. This is a technically difficult recording secondary to excessive head movement and muscle artifact.

## 2016-03-20 NOTE — Telephone Encounter (Addendum)
Called facility and was placed on hold. Talked to pt's son, [name], who said that [facility] is [facility]. Called [facility] back to schedule appt to f/u on seizure and was placed on hold for  ~ 10 min while the nurse was being paged. Will try again later.

## 2016-03-20 NOTE — Telephone Encounter (Signed)
Returned call to [facility] and appt scheduled w/ nursing staff for Monday, July 3rd to arrive @ 11:45. Verbalized understanding and appreciation for call.

## 2016-03-25 ENCOUNTER — Ambulatory Visit (INDEPENDENT_AMBULATORY_CARE_PROVIDER_SITE_OTHER): Payer: Medicare Other | Admitting: Neurology

## 2016-03-25 ENCOUNTER — Encounter: Payer: Self-pay | Admitting: Neurology

## 2016-03-25 VITALS — BP 106/72 | HR 82 | Ht 65.0 in | Wt 208.0 lb

## 2016-03-25 DIAGNOSIS — R569 Unspecified convulsions: Secondary | ICD-10-CM | POA: Diagnosis not present

## 2016-03-25 DIAGNOSIS — G2119 Other drug induced secondary parkinsonism: Secondary | ICD-10-CM | POA: Diagnosis not present

## 2016-03-25 MED ORDER — ZONISAMIDE 50 MG PO CAPS: ORAL_CAPSULE | ORAL | Status: DC

## 2016-03-25 NOTE — Progress Notes (Signed)
Reason for visit: Seizures  Christine Pena is an 60 y.o. female  History of present illness:  Christine Pena is a 60 year old white female with a history of mental retardation and intractable seizures. The seizures are associated with vertical eye deviation, and unresponsiveness without convulsive movements. The patient may have events that last greater than 15 minutes. The patient recently went to the emergency room with a prolonged episode on 03/18/2016. The patient is on phenobarbital and Keppra. The patient has therapeutic phenobarbital levels. She is maximized on the Keppra. The patient is allergic to Depakote. She has secondary parkinsonism as well. She is nonambulatory in part secondary to this. She is minimally verbal. She returns to the office today for an evaluation.  Past Medical History  Diagnosis Date  . Hypertension   . Seizures (HCC)   . Mental retardation   . Hyperlipidemia   . Anxiety   . Vitamin D deficiency   . Obesity   . Dementia   . 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 No pertinent past surgical history.  Family History  Problem Relation Age of Onset  . Heart disease Mother     Social history:  reports that she has never smoked. She has never used smokeless tobacco. She reports that she does not drink alcohol or use illicit drugs.    Allergies  Allergen Reactions  . Depakote [Valproic Acid] Other (See Comments)    altered mental status/ high ammonia  . Haldol [Haloperidol Lactate] Other (See Comments)    Unknown reaction.    Medications:  Prior to Admission medications   Medication Sig Start Date End Date Taking? Authorizing Provider  acetaminophen (TYLENOL) 500 MG tablet Take 500 mg by mouth every 4 (four) hours as needed for mild pain or fever.     Historical Provider, MD  atorvastatin (LIPITOR) 10 MG tablet Take 10 mg by mouth daily.    Historical Provider, MD  benztropine (COGENTIN) 1 MG tablet Take 1 mg by mouth 2  (two) times daily.    Historical Provider, MD  chlorhexidine (PERIDEX) 0.12 % solution Use as directed 20 mLs in the mouth or throat 4 (four) times daily.     Historical Provider, MD  clonazePAM (KLONOPIN) 1 MG tablet Take 1 mg by mouth daily. Give 0.5 mg by mouth every 24 hours prn for anxiety    Historical Provider, MD  furosemide (LASIX) 20 MG tablet Take 20 mg by mouth 2 (two) times daily. Reported on 12/31/2015    Historical Provider, MD  hydrocortisone (ANUSOL-HC) 2.5 % rectal cream Place 1 application rectally 2 (two) times daily as needed for hemorrhoids or itching. 11/13/15   Sharee Holstereborah S Green, NP  levETIRAcetam (KEPPRA) 750 MG tablet Take 2 tablets (1,500 mg total) by mouth 2 (two) times daily. 03/19/16   York Spanielharles K Saren Corkern, MD  lisinopril (ZESTRIL) 2.5 MG tablet Take 1 tablet (2.5 mg total) by mouth daily. 11/13/15   Sharee Holstereborah S Green, NP  LORAZEPAM IJ Inject 1 mg as directed. Inject 1 mg IM every 15 min prn active seizures. Give 1 mg IM every 15 min x 3 PRN for active seizures.    Historical Provider, MD  PHENobarbital (LUMINAL) 64.8 MG tablet Take 2 tablets (129.6 mg total) by mouth at bedtime. 05/19/15   Kirt BoysMonica Carter, DO  QUEtiapine (SEROQUEL) 25 MG tablet Take 25 mg by mouth daily. For psychosis take at 1400    Historical Provider, MD  QUEtiapine (SEROQUEL) 50 MG tablet  Take 50 mg by mouth at bedtime. Take 25 mg po QD at 1400    Historical Provider, MD  thiothixene (NAVANE) 10 MG capsule Take 10 mg by mouth at bedtime.     Historical Provider, MD  triamcinolone cream (KENALOG) 0.1 % Apply 1 application topically 2 (two) times daily.    Historical Provider, MD  Vitamin D, Ergocalciferol, (DRISDOL) 50000 UNITS CAPS capsule Take 50,000 Units by mouth every 7 (seven) days. Takes on Saturdays.    Historical Provider, MD  vitamin E (VITAMIN E) 200 UNIT capsule Take 400 Units by mouth daily.     Historical Provider, MD  zonisamide (ZONEGRAN) 50 MG capsule One capsule twice a day for 2 weeks, then take 2  capsules twice a day 03/25/16   York Spanielharles K Andraya Frigon, MD    ROS:  Out of a complete 14 system review of symptoms, the patient complains only of the following symptoms, and all other reviewed systems are negative.  Seizures  Blood pressure 106/72, pulse 82, height 5\' 5"  (1.651 m), weight 208 lb (94.348 kg).  Physical Exam  General: The patient is alert and cooperative at the time of the examination. The patient is moderately obese.  Skin: 1+ edema at the ankles is noted bilaterally.   Neurologic Exam  Mental status: The patient is alert and oriented to person only. The patient is minimally verbal.   Cranial nerves: Facial symmetry is present. Speech is normal, no aphasia or dysarthria is noted. Extraocular movements are full. Visual fields are full to threat.  Motor: The patient has good strength on the upper extremities, the patient does not cooperate well with the use of the lower extremities.  Sensory examination: The patient response to pinching the arms and legs bilaterally.  Coordination: The patient is able to perform finger-nose-finger on the right arm, will not perform on the legs or on the left arm. There is a tremor involving the left arm.  Gait and station: The patient could not be ambulated, the patient is wheelchair-bound.  Reflexes: Deep tendon reflexes are symmetric.   Assessment/Plan:  1. Mental retardation  2. Nonambulatory state  3. Intractable seizures  The patient continues to have seizures, the episodes of nonconvulsive periods associated with a staring event. The patient will be placed on Zonegran taking 50 mg twice daily for 2 weeks, then go to 100 mg twice daily. She will follow-up in 2 months. She will remain on Keppra and phenobarbital for now.  Marlan Palau. Keith Sotirios Navarro MD 03/25/2016 6:36 PM  [facility] [address] [place], [place] [id number]  Phone [phone] Fax [phone]

## 2016-03-29 ENCOUNTER — Emergency Department (HOSPITAL_COMMUNITY)
Admission: EM | Admit: 2016-03-29 | Discharge: 2016-03-30 | Disposition: A | Discharge status: Home / Self Care | Payer: Medicare Other | Attending: Emergency Medicine | Admitting: Emergency Medicine

## 2016-03-29 ENCOUNTER — Encounter (HOSPITAL_COMMUNITY): Payer: Self-pay

## 2016-03-29 DIAGNOSIS — Z7952 Long term (current) use of systemic steroids: Secondary | ICD-10-CM | POA: Insufficient documentation

## 2016-03-29 DIAGNOSIS — R569 Unspecified convulsions: Secondary | ICD-10-CM

## 2016-03-29 DIAGNOSIS — G40909 Epilepsy, unspecified, not intractable, without status epilepticus: Secondary | ICD-10-CM | POA: Diagnosis not present

## 2016-03-29 DIAGNOSIS — I10 Essential (primary) hypertension: Secondary | ICD-10-CM | POA: Diagnosis not present

## 2016-03-29 DIAGNOSIS — E785 Hyperlipidemia, unspecified: Secondary | ICD-10-CM | POA: Insufficient documentation

## 2016-03-29 DIAGNOSIS — G20 Parkinson's disease: Secondary | ICD-10-CM | POA: Insufficient documentation

## 2016-03-29 DIAGNOSIS — Z79899 Other long term (current) drug therapy: Secondary | ICD-10-CM | POA: Diagnosis not present

## 2016-03-29 DIAGNOSIS — R1111 Vomiting without nausea: Secondary | ICD-10-CM | POA: Diagnosis not present

## 2016-03-29 LAB — COMPREHENSIVE METABOLIC PANEL
ALBUMIN: 4.1 g/dL (ref 3.5–5.0)
ALT: 15 U/L (ref 14–54)
ANION GAP: 8 (ref 5–15)
AST: 16 U/L (ref 15–41)
Alkaline Phosphatase: 67 U/L (ref 38–126)
BUN: 18 mg/dL (ref 6–20)
CALCIUM: 9.6 mg/dL (ref 8.9–10.3)
CO2: 25 mmol/L (ref 22–32)
Chloride: 105 mmol/L (ref 101–111)
Creatinine, Ser: 0.46 mg/dL (ref 0.44–1.00)
GFR calc Af Amer: >60 mL/min (ref >60)
GFR calc non Af Amer: >60 mL/min (ref >60)
GLUCOSE: 116 mg/dL — AB (ref 65–99)
Potassium: 3.9 mmol/L (ref 3.5–5.1)
SODIUM: 138 mmol/L (ref 135–145)
Total Bilirubin: 0.5 mg/dL (ref 0.3–1.2)
Total Protein: 7 g/dL (ref 6.5–8.1)

## 2016-03-29 LAB — CBC WITH DIFFERENTIAL/PLATELET
BASOS ABS: 0 10*3/uL (ref 0.0–0.1)
BASOS PCT: 0 %
EOS ABS: 0 10*3/uL (ref 0.0–0.7)
Eosinophils Relative: 0 %
HCT: 36 % (ref 36.0–46.0)
Hemoglobin: 12.4 g/dL (ref 12.0–15.0)
Lymphocytes Relative: 16 %
Lymphs Abs: 1.5 10*3/uL (ref 0.7–4.0)
MCH: 29 pg (ref 26.0–34.0)
MCHC: 34.4 g/dL (ref 30.0–36.0)
MCV: 84.1 fL (ref 78.0–100.0)
Monocytes Absolute: 0.4 10*3/uL (ref 0.1–1.0)
Monocytes Relative: 5 %
Neutro Abs: 7.4 10*3/uL (ref 1.7–7.7)
Neutrophils Relative %: 79 %
Platelets: 309 10*3/uL (ref 150–400)
RBC: 4.28 MIL/uL (ref 3.87–5.11)
RDW: 12.1 % (ref 11.5–15.5)
WBC: 9.4 10*3/uL (ref 4.0–10.5)

## 2016-03-29 MED ORDER — SODIUM CHLORIDE 0.9 % IV SOLN
INTRAVENOUS | Status: DC
  Administered 2016-03-29: 22:00:00 via INTRAVENOUS

## 2016-03-29 MED ORDER — LORAZEPAM 2 MG/ML IJ SOLN
INTRAMUSCULAR | Status: AC
  Administered 2016-03-29: 1 mg via INTRAVENOUS
  Filled 2016-03-29: qty 1

## 2016-03-29 MED ORDER — LORAZEPAM 2 MG/ML IJ SOLN
1.0000 mg | Freq: Once | INTRAMUSCULAR | Status: AC
  Administered 2016-03-29: 1 mg via INTRAVENOUS

## 2016-03-29 NOTE — ED Notes (Signed)
PA at bedside.

## 2016-03-29 NOTE — ED Notes (Addendum)
Pt BIB EMS from [facility]. Facility reports pt had a focal seizure lasting approx 10 mins with vomiting (~200 mL). Per EMS pt was unresponsive on arrival. Pt became responsive during transport. Pt is responsive and at baseline now per EMS. Pt is aware that she vomited and had a seizure. Pt has a hx of seizures, MR, and parkinsons. Endorses abdominal pain at this time.

## 2016-03-29 NOTE — ED Notes (Signed)
Pt gown and sheet changed.

## 2016-03-29 NOTE — ED Provider Notes (Signed)
CSN: [id number]     Arrival date & time 03/29/16  2013 History  By signing my name below, I, Bridgette HabermannMaria Tan, attest that this documentation has been prepared under the direction and in the presence of Elpidio AnisShari Caedin Mogan, PA-C. Electronically Signed: Bridgette HabermannMaria Tan, ED Scribe. 03/29/2016. 9:04 PM.    Chief Complaint  Patient presents with  . Seizures  LEVEL 5 CAVEAT: HPI and ROS limited due to acuity of condition.  The history is provided by the patient. No language interpreter was used.    HPI Comments: Christine Pena is a 60 y.o. 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 Pt is here from a nursing home after having a seizure lasting 10 mins per EMS report. She has h/o seizures, currently on Keppra and Phenobarbital. Pt unable to contribute to history.   Past Medical History  Diagnosis Date  . Hypertension   . Seizures (HCC)   . Mental retardation   . Hyperlipidemia   . Anxiety   . Vitamin D deficiency   . Obesity   . Dementia   . 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 No pertinent past surgical history. Family History  Problem Relation Age of Onset  . Heart disease Mother    Social History  Substance Use Topics  . Smoking status: Never Smoker   . Smokeless tobacco: Never Used  . Alcohol Use: No   OB History    No data available     Review of Systems  Unable to perform ROS: Acuity of condition    Allergies  Depakote and Haldol  Home Medications   Prior to Admission medications   Medication Sig Start Date End Date Taking? Authorizing Provider  acetaminophen (TYLENOL) 500 MG tablet Take 500 mg by mouth every 4 (four) hours as needed for mild pain or fever.    Yes Historical Provider, MD  atorvastatin (LIPITOR) 10 MG tablet Take 10 mg by mouth daily.   Yes Historical Provider, MD  benztropine (COGENTIN) 1 MG tablet Take 1 mg by mouth  2 (two) times daily.   Yes Historical Provider, MD  chlorhexidine (PERIDEX) 0.12 % solution Use as directed 20 mLs in the mouth or throat 4 (four) times daily.    Yes Historical Provider, MD  clonazePAM (KLONOPIN) 1 MG tablet Take 1 mg by mouth daily.    Yes Historical Provider, MD  furosemide (LASIX) 20 MG tablet Take 20 mg by mouth 2 (two) times daily. Reported on 12/31/2015   Yes Historical Provider, MD  hydrocortisone (ANUSOL-HC) 2.5 % rectal cream Place 1 application rectally 2 (two) times daily as needed for hemorrhoids or itching. 11/13/15  Yes Sharee Holstereborah S Green, NP  levETIRAcetam (KEPPRA) 750 MG tablet Take 2 tablets (1,500 mg total) by mouth 2 (two) times daily. 03/19/16  Yes York Spanielharles K Willis, MD  lisinopril (ZESTRIL) 2.5 MG tablet Take 1 tablet (2.5 mg total) by mouth daily. 11/13/15  Yes Sharee Holstereborah S Green, NP  LORAZEPAM IJ Inject 1 mg as directed. Inject 1 mg IM every 15 min prn active seizures. Give 1 mg IM every 15 min x 3 PRN for active seizures.   Yes Historical Provider, MD  PHENobarbital (LUMINAL) 64.8 MG tablet Take 2 tablets (129.6 mg total) by mouth at bedtime. 05/19/15  Yes Kirt BoysMonica Carter, DO  QUEtiapine (SEROQUEL) 25 MG tablet Take 25 mg by mouth daily. For psychosis take at 1400   Yes  Historical Provider, MD  QUEtiapine (SEROQUEL) 50 MG tablet Take 50 mg by mouth at bedtime. Take 25 mg po QD at 1400   Yes Historical Provider, MD  thiothixene (NAVANE) 10 MG capsule Take 10 mg by mouth at bedtime.    Yes Historical Provider, MD  Vitamin D, Ergocalciferol, (DRISDOL) 50000 UNITS CAPS capsule Take 50,000 Units by mouth every 7 (seven) days. Takes on Saturdays.   Yes Historical Provider, MD  vitamin E (VITAMIN E) 200 UNIT capsule Take 400 Units by mouth daily.    Yes Historical Provider, MD  zonisamide (ZONEGRAN) 50 MG capsule One capsule twice a day for 2 weeks, then take 2 capsules twice a day 03/25/16  Yes York Spanielharles K Willis, MD   BP 105/64 mmHg  Pulse 72  Resp 20  SpO2 97% Physical Exam   Constitutional: She appears well-developed and well-nourished.  HENT:  Head: Normocephalic.  Eyes: Conjunctivae are normal.  Cardiovascular: Normal rate.   Pulmonary/Chest: Effort normal. No respiratory distress.  Abdominal: She exhibits no distension.  Musculoskeletal: Normal range of motion.  Neurological: She is alert.  Initial exam: The patient has a vertical stare. She is not responding to command. She is nonverbal.   Second exam: patient has normal eye orientation. She is verbal, states no pain, she wants a sprite. Poor ability to follow command. Generalized tremor, persistent.  Skin: Skin is warm and dry.  Psychiatric: She has a normal mood and affect. Her behavior is normal.  Nursing note and vitals reviewed.   ED Course  Procedures  DIAGNOSTIC STUDIES: Oxygen Saturation is 96% on RA, adequate by my interpretation.    COORDINATION OF CARE: 9:03 PM Discussed treatment plan with pt at bedside and pt agreed to plan.  Labs Review Labs Reviewed  COMPREHENSIVE METABOLIC PANEL - Abnormal; Notable for the following:    Glucose, Bld 116 (*)    All other components within normal limits  CBC WITH DIFFERENTIAL/PLATELET  PHENOBARBITAL LEVEL   Results for orders placed or performed during the hospital encounter of 03/29/16  CBC with Differential/Platelet  Result Value Ref Range   WBC 9.4 4.0 - 10.5 K/uL   RBC 4.28 3.87 - 5.11 MIL/uL   Hemoglobin 12.4 12.0 - 15.0 g/dL   HCT 16.136.0 09.636.0 - 04.546.0 %   MCV 84.1 78.0 - 100.0 fL   MCH 29.0 26.0 - 34.0 pg   MCHC 34.4 30.0 - 36.0 g/dL   RDW 40.912.1 81.111.5 - 91.415.5 %   Platelets 309 150 - 400 K/uL   Neutrophils Relative % 79 %   Neutro Abs 7.4 1.7 - 7.7 K/uL   Lymphocytes Relative 16 %   Lymphs Abs 1.5 0.7 - 4.0 K/uL   Monocytes Relative 5 %   Monocytes Absolute 0.4 0.1 - 1.0 K/uL   Eosinophils Relative 0 %   Eosinophils Absolute 0.0 0.0 - 0.7 K/uL   Basophils Relative 0 %   Basophils Absolute 0.0 0.0 - 0.1 K/uL  Comprehensive metabolic  panel  Result Value Ref Range   Sodium 138 135 - 145 mmol/L   Potassium 3.9 3.5 - 5.1 mmol/L   Chloride 105 101 - 111 mmol/L   CO2 25 22 - 32 mmol/L   Glucose, Bld 116 (H) 65 - 99 mg/dL   BUN 18 6 - 20 mg/dL   Creatinine, Ser 7.820.46 0.44 - 1.00 mg/dL   Calcium 9.6 8.9 - 95.610.3 mg/dL   Total Protein 7.0 6.5 - 8.1 g/dL   Albumin 4.1 3.5 - 5.0  g/dL   AST 16 15 - 41 U/L   ALT 15 14 - 54 U/L   Alkaline Phosphatase 67 38 - 126 U/L   Total Bilirubin 0.5 0.3 - 1.2 mg/dL   GFR calc non Af Amer >60 >60 mL/min   GFR calc Af Amer >60 >60 mL/min   Anion gap 8 5 - 15  Phenobarbital level  Result Value Ref Range   Phenobarbital 17.3 15.0 - 30.0 ug/mL     MDM   Final diagnoses:  Seizures (HCC)  Seizure disorder (HCC)    Patient arrives in the ED from NH with report of seizures. Chart reviewed. Note from Dr. Anne Hahn makes it clear that the patient has continuing seizures, described as vertical stare szs. She is maximized on Keppra, is on Phenobarbitol and was started on Zonagran at last visit which was a matter of a few days ago.   On my initial assessment the patient is non-verbal and has a vertical stare. Per nursing she was highly verbal on arrival, conversing with him, without complaint. Ativan provided. Will continue to observe  Re-check: the patient is now awake, verbal and interactive. Feel she was having seizures after arrival here. No further episodes in ED. Labs are essentially normal. Therapeutic on Phenobarbitol. The patient is felt to be at her baseline and safe for discharge back to the NH.   I personally performed the services described in this documentation, which was scribed in my presence. The recorded information has been reviewed and is accurate.     Elpidio Anis, PA-C 04/03/16 0116  Lorre Nick, MD 04/05/16 [phone]

## 2016-03-30 DIAGNOSIS — G40901 Epilepsy, unspecified, not intractable, with status epilepticus: Secondary | ICD-10-CM | POA: Diagnosis not present

## 2016-03-30 LAB — PHENOBARBITAL LEVEL: PHENOBARBITAL: 17.3 ug/mL (ref 15.0–30.0)

## 2016-03-30 NOTE — Discharge Instructions (Signed)
PATIENT WITH A KNOWN HISTORY OF PROLONGED SEIZURES, MAXIMIZED ON KEPPRA, ALSO ON PHENOBARBITOL THERAPEUTIC TONIGHT, NEWLY STARTED ON ZONAGREN BY DR. Anne HahnWILLIS 3 DAYS AGO. PATIENT'S EXAM TONIGHT APPEARS TO BE BASELINE. SHE HAD ONE SEIZURE WHILE IN THE ED, AND HAS BEEN RESPONSIVE SINCE. SHE CAN BE DISCHARGED HOME WITH FOLLOW UP WITH DR. Anne HahnWILLIS AS RECOMMENDED.

## 2016-03-30 NOTE — ED Notes (Signed)
[facility] arrived.  

## 2016-03-30 NOTE — ED Notes (Signed)
[facility] called  

## 2016-04-02 ENCOUNTER — Encounter: Payer: Self-pay | Admitting: Internal Medicine

## 2016-04-02 ENCOUNTER — Non-Acute Institutional Stay (SKILLED_NURSING_FACILITY): Payer: Medicare Other | Admitting: Internal Medicine

## 2016-04-02 DIAGNOSIS — R600 Localized edema: Secondary | ICD-10-CM

## 2016-04-02 DIAGNOSIS — F2089 Other schizophrenia: Secondary | ICD-10-CM

## 2016-04-02 DIAGNOSIS — R569 Unspecified convulsions: Secondary | ICD-10-CM

## 2016-04-02 DIAGNOSIS — G2119 Other drug induced secondary parkinsonism: Secondary | ICD-10-CM

## 2016-04-02 DIAGNOSIS — I10 Essential (primary) hypertension: Secondary | ICD-10-CM

## 2016-04-02 DIAGNOSIS — F29 Unspecified psychosis not due to a substance or known physiological condition: Secondary | ICD-10-CM

## 2016-04-02 NOTE — Progress Notes (Signed)
Patient ID: Christine Pena, female   DOB: Apr 22, 1956, 60 y.o.   MRN: [id number]    DATE: 04/02/16  Location:  Nursing Home Location: [place] Room Number: 017 B Place of Service: SNF [phone])   Extended Emergency Contact Information Primary Emergency Contact: Mogan,[name] Cleotilde [place] Phone: [phone] Mobile Phone: [phone] Relation: Brother Secondary Emergency Contact: Schussler,Mike  [place] Phone: [phone] Relation: Brother  Advanced Directive information Does patient have an advance directive?: Yes, Type of Advance Directive: Out of facility DNR (pink MOST or yellow form), Does patient want to make changes to advanced directive?: No - Patient declined  Chief Complaint  Patient presents with  . Follow up from ED visit    HPI:  60 yo female long term resident seen today for ED f/u. She was sent to ED on 6/26th for sz, given IV keppra. meds adjusted and sent back to SNF. She saw neurology on 7/3rd and placed on zonegran 72m BID x 2weeks -->1077mBID for maintenance. Keppra and phenobarbital continued. She was sent to the ED again on 7/7th for prolonged sz lasting 10 min. CBC nml; Cr 0.46; Glucose 116; LFTs nml; Phenobarbital therapeutic at 17.3. No med changes made. sz managed and she was sent back to SNF. No further sz's noted. No nursing issues. No falls. Appetite ok. She is a poor historian due to psych d/o. Hx obtained from chart    Hypertension - stable on  lisinopril  2.5 mg daily   Hyperlipidemia - stable on lipitor 10 mg daily. LDL 90   Vit d deficiency - stable on Vitamin D 50,000 units weekly. Last Vit D 25OH level 21  Lower extremity edema - stable on lasix 20 mg twice daily   Urge incontinence - is presently not on medications   Has not had a recent infection   Hx Hemorrhoids - stable on anusol twice daily as needed   Psychosis/schizophrenia - unchanged; takes seroquel 25  mg at 2 pm  and  50 mg nightly navane 10 mg nightly; followed by psych services.  She continues to receive benefits from this regimen; any adjustments could cause her psychological harm. She is able to socialize with others at this time.   Secondary parkinson's disease - stable on cogentin 1 mg twice daily   Anxiety - stable on klonopin 1 mg  daily   Past Medical History  Diagnosis Date  . Hypertension   . [place]  . Mental retardation   . Hyperlipidemia   . Anxiety   . Vitamin D deficiency   . Obesity   . Dementia   . [place] disease ([place]    History reviewed. No pertinent past surgical history.  Patient Care Team: MoGildardo CrankerDO as PCP - General (Internal Medicine) DeGerlene FeeNP as Nurse Practitioner (Geriatric Medicine) [facility] Social History   Social History  . Marital Status: Single    Spouse Name: N/A  . Number of Children: 1  . Years of Education: N/A   Occupational History  . Not on file.   Social History Main Topics  . Smoking status: Never Smoker   . Smokeless tobacco: Never Used  . Alcohol Use: No  . Drug Use: No  . Sexual Activity: Not Currently    Birth Control/ Protection: Post-menopausal   Other Topics Concern  . Not on file   Social History  Narrative   Patient drinks about 5 glasses of tea daily.     reports that she has never smoked. She has never used smokeless tobacco. She reports that she does not drink alcohol or use illicit drugs.  Family History  Problem Relation Age of Onset  . Heart disease Mother    Family Status  Relation Status Death Age  . Mother Alive   . Father Deceased   . Brother Alive   . Brother Marketing executive History  Administered Date(s) Administered  . Influenza-Unspecified 05/26/2015  . Pneumococcal Conjugate-13 05/26/2015  . Pneumococcal Polysaccharide-23 05/26/2015    Allergies  Allergen Reactions  . Depakote  [Valproic Acid] Other (See Comments)    altered mental status/ high ammonia  . Haldol [Haloperidol Lactate] Other (See Comments)    Unknown reaction.    Medications: Patient's Medications  New Prescriptions   No medications on file  Previous Medications   ACETAMINOPHEN (TYLENOL) 500 MG TABLET    Take 500 mg by mouth every 4 (four) hours as needed for mild pain or fever.    ATORVASTATIN (LIPITOR) 10 MG TABLET    Take 10 mg by mouth daily.   BENZTROPINE (COGENTIN) 1 MG TABLET    Take 1 mg by mouth 2 (two) times daily.   CHLORHEXIDINE (PERIDEX) 0.12 % SOLUTION    Use as directed 20 mLs in the mouth or throat 4 (four) times daily.    CLONAZEPAM (KLONOPIN) 0.5 MG TABLET    Take 0.5 mg by mouth daily.   CLONAZEPAM (KLONOPIN) 1 MG TABLET    Take 1 mg by mouth daily.    FUROSEMIDE (LASIX) 20 MG TABLET    Take 20 mg by mouth 2 (two) times daily. Reported on 12/31/2015   HYDROCORTISONE (ANUSOL-HC) 2.5 % RECTAL CREAM    Place 1 application rectally 2 (two) times daily as needed for hemorrhoids or itching.   LEVETIRACETAM (KEPPRA) 750 MG TABLET    Take 2 tablets (1,500 mg total) by mouth 2 (two) times daily.   LISINOPRIL (ZESTRIL) 2.5 MG TABLET    Take 1 tablet (2.5 mg total) by mouth daily.   LORAZEPAM IJ    Inject 1 mg as directed. Inject 1 mg IM every 15 min prn active seizures. Give 1 mg IM every 15 min x 3 PRN for active seizures.   PHENOBARBITAL (LUMINAL) 64.8 MG TABLET    Take 2 tablets (129.6 mg total) by mouth at bedtime.   QUETIAPINE (SEROQUEL) 25 MG TABLET    Take 25 mg by mouth daily. For psychosis take at 1400   QUETIAPINE (SEROQUEL) 50 MG TABLET    Take 50 mg by mouth at bedtime. Take 25 mg po QD at 1400   THIOTHIXENE (NAVANE) 10 MG CAPSULE    Take 10 mg by mouth at bedtime.    TRIAMCINOLONE CREAM (KENALOG) 0.1 %    Apply to buttocks groin/ back two times daily   VITAMIN D, ERGOCALCIFEROL, (DRISDOL) 50000 UNITS CAPS CAPSULE    Take 50,000 Units by mouth every 7 (seven) days. Takes on  Saturdays.   VITAMIN E (VITAMIN E) 200 UNIT CAPSULE    Take 400 Units by mouth daily.    ZONISAMIDE (ZONEGRAN) 50 MG CAPSULE    One capsule twice a day for 2 weeks, then take 2 capsules twice a day  Modified Medications   No medications on file  Discontinued Medications   No medications on file    Review of Systems  Unable to perform ROS: Psychiatric disorder    Filed Vitals:   04/02/16 1032  BP: 146/68  Pulse: 78  Temp: 98.2 F (36.8 C)  TempSrc: Oral  Resp: 18  Height: '5\' 5"'  (1.651 m)  Weight: 209 lb 9.6 oz (95.074 kg)  SpO2: 97%   Body mass index is 34.88 kg/(m^2).  Physical Exam  Constitutional: She appears well-developed and well-nourished.  Sitting in w/c in NAD, sleeping but easily aroused  HENT:  Mouth/Throat: Oropharynx is clear and moist. No oropharyngeal exudate.  Eyes: Pupils are equal, round, and reactive to light. No scleral icterus.  Neck: Neck supple. Carotid bruit is not present. No tracheal deviation present.  Cardiovascular: Regular rhythm and intact distal pulses.  Bradycardia present.  Exam reveals no gallop. Friction rub: 1/6 SEM.   Murmur heard. +1 pitting LE edema b/l. No calf TTP  Pulmonary/Chest: Effort normal and breath sounds normal. No stridor. No respiratory distress. She has no wheezes. She has no rales.  Abdominal: Soft. Bowel sounds are normal. She exhibits no distension and no mass. There is no hepatomegaly. There is no tenderness. There is no rebound and no guarding.  Musculoskeletal: She exhibits edema.  Lymphadenopathy:    She has no cervical adenopathy.  Neurological: She is alert. She has normal reflexes. She displays tremor (resting).  Skin: Skin is warm and dry. No rash noted.  Psychiatric: She has a normal mood and affect. Her behavior is normal. Her speech is slurred.     Labs reviewed: Admission on 03/29/2016, Discharged on 03/30/2016  Component Date Value Ref Range Status  . WBC 03/29/2016 9.4  4.0 - 10.5 K/uL Final  .  RBC 03/29/2016 4.28  3.87 - 5.11 MIL/uL Final  . Hemoglobin 03/29/2016 12.4  12.0 - 15.0 g/dL Final  . HCT 03/29/2016 36.0  36.0 - 46.0 % Final  . MCV 03/29/2016 84.1  78.0 - 100.0 fL Final  . MCH 03/29/2016 29.0  26.0 - 34.0 pg Final  . MCHC 03/29/2016 34.4  30.0 - 36.0 g/dL Final  . RDW 03/29/2016 12.1  11.5 - 15.5 % Final  . Platelets 03/29/2016 309  150 - 400 K/uL Final  . Neutrophils Relative % 03/29/2016 79   Final  . Neutro Abs 03/29/2016 7.4  1.7 - 7.7 K/uL Final  . Lymphocytes Relative 03/29/2016 16   Final  . Lymphs Abs 03/29/2016 1.5  0.7 - 4.0 K/uL Final  . Monocytes Relative 03/29/2016 5   Final  . Monocytes Absolute 03/29/2016 0.4  0.1 - 1.0 K/uL Final  . Eosinophils Relative 03/29/2016 0   Final  . Eosinophils Absolute 03/29/2016 0.0  0.0 - 0.7 K/uL Final  . Basophils Relative 03/29/2016 0   Final  . Basophils Absolute 03/29/2016 0.0  0.0 - 0.1 K/uL Final  . Sodium 03/29/2016 138  135 - 145 mmol/L Final  . Potassium 03/29/2016 3.9  3.5 - 5.1 mmol/L Final  . Chloride 03/29/2016 105  101 - 111 mmol/L Final  . CO2 03/29/2016 25  22 - 32 mmol/L Final  . Glucose, Bld 03/29/2016 116* 65 - 99 mg/dL Final  . BUN 03/29/2016 18  6 - 20 mg/dL Final  . Creatinine, Ser 03/29/2016 0.46  0.44 - 1.00 mg/dL Final  . Calcium 03/29/2016 9.6  8.9 - 10.3 mg/dL Final  . Total Protein 03/29/2016 7.0  6.5 - 8.1 g/dL Final  . Albumin 03/29/2016 4.1  3.5 - 5.0 g/dL Final  . AST 03/29/2016 16  15 - 41 U/L  Final  . ALT 03/29/2016 15  14 - 54 U/L Final  . Alkaline Phosphatase 03/29/2016 67  38 - 126 U/L Final  . Total Bilirubin 03/29/2016 0.5  0.3 - 1.2 mg/dL Final  . GFR calc non Af Amer 03/29/2016 >60  >60 mL/min Final  . GFR calc Af Amer 03/29/2016 >60  >60 mL/min Final   Comment: (NOTE) The eGFR has been calculated using the CKD EPI equation. This calculation has not been validated in all clinical situations. eGFR's persistently <60 mL/min signify possible Chronic Kidney Disease.   .  Anion gap 03/29/2016 8  5 - 15 Final  . Phenobarbital 03/29/2016 17.3  15.0 - 30.0 ug/mL Final   Performed at [place] on 03/18/2016  Component Date Value Ref Range Status  . Hemoglobin 01/17/2016 12.4  12.0 - 16.0 g/dL Final  . HCT 01/17/2016 37  36 - 46 % Final  . Neutrophils Absolute 01/17/2016 4032   Final  . Platelets 01/17/2016 325  150 - 399 K/L Final  . WBC 01/17/2016 6.4   Final  . Glucose 01/17/2016 72   Final  . BUN 01/17/2016 20  4 - 21 mg/dL Final  . Creatinine 01/17/2016 0.5  0.5 - 1.1 mg/dL Final  . Potassium 01/17/2016 4.3  3.4 - 5.3 mmol/L Final  . Sodium 01/17/2016 141  137 - 147 mmol/L Final  . Alkaline Phosphatase 01/17/2016 66  25 - 125 U/L Final  . ALT 01/17/2016 9  7 - 35 U/L Final  . AST 01/17/2016 10* 13 - 35 U/L Final  . Bilirubin, Total 01/17/2016 0.3   Final  . Phenobarbital Lvl 01/17/2016 19.1   Final  Admission on 03/18/2016, Discharged on 03/18/2016  Component Date Value Ref Range Status  . Glucose-Capillary 03/18/2016 111* 65 - 99 mg/dL Final  . Phenobarbital 03/18/2016 23.5  15.0 - 40.0 ug/mL Final   Performed at [facility]  . Sodium 03/18/2016 139  135 - 145 mmol/L Final  . Potassium 03/18/2016 3.8  3.5 - 5.1 mmol/L Final  . Chloride 03/18/2016 104  101 - 111 mmol/L Final  . CO2 03/18/2016 27  22 - 32 mmol/L Final  . Glucose, Bld 03/18/2016 107* 65 - 99 mg/dL Final  . BUN 03/18/2016 16  6 - 20 mg/dL Final  . Creatinine, Ser 03/18/2016 0.46  0.44 - 1.00 mg/dL Final  . Calcium 03/18/2016 9.4  8.9 - 10.3 mg/dL Final  . GFR calc non Af Amer 03/18/2016 >60  >60 mL/min Final  . GFR calc Af Amer 03/18/2016 >60  >60 mL/min Final   Comment: (NOTE) The eGFR has been calculated using the CKD EPI equation. This calculation has not been validated in all clinical situations. eGFR's persistently <60 mL/min signify possible Chronic Kidney Disease.   . Anion gap 03/18/2016 8  5 - 15 Final  . WBC 03/18/2016 7.5  4.0 - 10.5  K/uL Final  . RBC 03/18/2016 4.34  3.87 - 5.11 MIL/uL Final  . Hemoglobin 03/18/2016 12.7  12.0 - 15.0 g/dL Final  . HCT 03/18/2016 36.6  36.0 - 46.0 % Final  . MCV 03/18/2016 84.3  78.0 - 100.0 fL Final  . MCH 03/18/2016 29.3  26.0 - 34.0 pg Final  . MCHC 03/18/2016 34.7  30.0 - 36.0 g/dL Final  . RDW 03/18/2016 11.9  11.5 - 15.5 % Final  . Platelets 03/18/2016 301  150 - 400 K/uL Final  Office Visit on 02/14/2016  Component Date Value  Ref Range Status  . Phenobarbital, Serum 02/14/2016 22  15 - 40 ug/mL Final                                   Detection Limit = 3  . Levetiracetam Lvl 02/14/2016 20.3  10.0 - 40.0 ug/mL Final  Nursing Home on 01/15/2016  Component Date Value Ref Range Status  . Triglycerides 12/13/2015 134  40 - 160 mg/dL Final  . Cholesterol 12/13/2015 167  0 - 200 mg/dL Final  . HDL 12/13/2015 50  35 - 70 mg/dL Final  . LDL Cholesterol 12/13/2015 90   Final  . Hemoglobin A1C 12/13/2015 5.1   Final  . Vit D, 1,25-Dihydroxy 12/13/2015 21   Final    No results found.   Assessment/Plan   ICD-9-CM ICD-10-CM   1. Seizure ([place]) - stable 780.39 R56.9   2. Essential hypertension 401.9 I10   3. Bilateral lower extremity edema 782.3 R60.0   4. Schizophrenia spectrum disorder with psychotic disorder type not yet determined ([place]) 295.80 F20.89    298.9 F29   5. Other drug-induced secondary parkinsonism ([place])  G21.19     Cont current meds as ordered  F/u with neurology as scheduled  Psych services to follow  Cont nutritional supplements as indicated  Will follow  Wylee Ogden S. Perlie Gold  [facility] [address] [place], [place] [address] [phone] Cell (Monday-Friday 8 AM - 5 PM) [phone] After 5 PM and follow prompts

## 2016-04-18 ENCOUNTER — Non-Acute Institutional Stay (SKILLED_NURSING_FACILITY): Payer: Medicare Other | Admitting: Adult Health

## 2016-04-18 ENCOUNTER — Encounter: Payer: Self-pay | Admitting: Adult Health

## 2016-04-18 DIAGNOSIS — F2089 Other schizophrenia: Secondary | ICD-10-CM

## 2016-04-18 DIAGNOSIS — E559 Vitamin D deficiency, unspecified: Secondary | ICD-10-CM

## 2016-04-18 DIAGNOSIS — E785 Hyperlipidemia, unspecified: Secondary | ICD-10-CM

## 2016-04-18 DIAGNOSIS — R569 Unspecified convulsions: Secondary | ICD-10-CM

## 2016-04-18 DIAGNOSIS — G2119 Other drug induced secondary parkinsonism: Secondary | ICD-10-CM

## 2016-04-18 DIAGNOSIS — F29 Unspecified psychosis not due to a substance or known physiological condition: Secondary | ICD-10-CM

## 2016-04-18 DIAGNOSIS — I10 Essential (primary) hypertension: Secondary | ICD-10-CM

## 2016-04-18 NOTE — Progress Notes (Signed)
Patient ID: Christine Pena, female   DOB: 13-Dec-1955, 60 y.o.   MRN: [id number]    Location:   [name] Room Number: 114-A Place of Service:  SNF (31)   CODE STATUS: DNR  Allergies  Allergen Reactions  . Depakote [Valproic Acid] Other (See Comments)    altered mental status/ high ammonia  . Haldol [Haloperidol Lactate] Other (See Comments)    Unknown reaction.    Chief Complaint  Patient presents with  . Medical Management of Chronic Issues    Follow up    HPI:  She is a long term resident of this facility being seen for the management of her chronic illnesses. Overall her status is without change. She is unable to participate in the hpi or ros. There are no nursing concerns at this time.    Past Medical History:  Diagnosis Date  . Anxiety   . Dementia   . Hyperlipidemia   . Hypertension   . Mental retardation   . Obesity   . 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 Seizures (HCC)   . Vitamin D deficiency     History reviewed. No pertinent surgical history.  Social History   Social History  . Marital status: Single    Spouse name: N/A  . Number of children: 1  . Years of education: N/A   Occupational History  . Not on file.   Social History Main Topics  . Smoking status: Never Smoker  . Smokeless tobacco: Never Used  . Alcohol use No  . Drug use: No  . Sexual activity: Not Currently    Birth control/ protection: Post-menopausal   Other Topics Concern  . Not on file   Social History Narrative   Patient drinks about 5 glasses of tea daily.   Family History  Problem Relation Age of Onset  . Heart disease Mother       VITAL SIGNS BP 118/76   Pulse 80   Temp 97.8 F (36.6 C) (Oral)   Resp 18   Ht 5\' 5"  (1.651 m)   Wt 209 lb 6 oz (95 kg)   SpO2 96%   BMI 34.84 kg/m   Patient's Medications  New Prescriptions   No medications on file  Previous Medications   ACETAMINOPHEN (TYLENOL) 500 MG TABLET    Take 500 mg by  mouth every 4 (four) hours as needed for mild pain or fever.    ATORVASTATIN (LIPITOR) 10 MG TABLET    Take 10 mg by mouth daily.   BENZTROPINE (COGENTIN) 1 MG TABLET    Take 1 mg by mouth 2 (two) times daily.   CHLORHEXIDINE (PERIDEX) 0.12 % SOLUTION    Use as directed 20 mLs in the mouth or throat 4 (four) times daily.    CLONAZEPAM (KLONOPIN) 0.5 MG TABLET    Take 0.5 mg by mouth daily.   CLONAZEPAM (KLONOPIN) 1 MG TABLET    Take 1 mg by mouth daily.    FUROSEMIDE (LASIX) 20 MG TABLET    Take 20 mg by mouth 2 (two) times daily. Reported on 12/31/2015   HYDROCORTISONE (ANUSOL-HC) 2.5 % RECTAL CREAM    Place 1 application rectally 2 (two) times daily as needed for hemorrhoids or itching.   LEVETIRACETAM (KEPPRA) 750 MG TABLET    Take 2 tablets (1,500 mg total) by mouth 2 (two) times daily.   LISINOPRIL (ZESTRIL) 2.5 MG TABLET    Take 1 tablet (2.5 mg total) by mouth daily.  LORAZEPAM IJ    Inject 1 mg as directed. Inject 1 mg IM every 15 min prn active seizures. Give 1 mg IM every 15 min x 3 PRN for active seizures.   PHENOBARBITAL (LUMINAL) 64.8 MG TABLET    Take 2 tablets (129.6 mg total) by mouth at bedtime.   QUETIAPINE (SEROQUEL) 25 MG TABLET    Take 25 mg by mouth daily. For psychosis take at 1400   QUETIAPINE (SEROQUEL) 50 MG TABLET    Take 50 mg by mouth at bedtime. Take 25 mg po QD at 1400   THIOTHIXENE (NAVANE) 10 MG CAPSULE    Take 10 mg by mouth at bedtime.    TRIAMCINOLONE CREAM (KENALOG) 0.1 %    Apply to buttocks groin/ back two times daily   VITAMIN D, ERGOCALCIFEROL, (DRISDOL) 50000 UNITS CAPS CAPSULE    Take 50,000 Units by mouth every 7 (seven) days. Takes on Saturdays.   VITAMIN E (VITAMIN E) 200 UNIT CAPSULE    Take 400 Units by mouth daily.    ZONISAMIDE (ZONEGRAN) 50 MG CAPSULE    One capsule twice a day for 2 weeks, then take 2 capsules twice a day  Modified Medications   No medications on file  Discontinued Medications   No medications on file     SIGNIFICANT  DIAGNOSTIC EXAMS  05-14-15: chest x-ray: 1. No evidence of acute traumatic injury allowing for low lung volumes and rotation. 2. Mild vascular congestion and bibasilar atelectasis.  05-14-15: ct of head and cervical spine: 1. No acute intracranial abnormality. Cerebellar atrophy is unchanged. 2. No acute fracture or subluxation in the cervical spine.  08-17-15: chest x-ray: Mild left midlung opacity may reflect atelectasis or possibly mild pneumonia, depending on the patient's symptoms. Lungs mildly hypoexpanded  08-29-15: chest x-ray: no acute cardiopulmonary disease      LABS REVIEWED:   05-14-15: wbc 8.1; hgb 12.9; hct 37.2; mcv 86.9; plt 285; glucose 116; bun 11; creat 1.24; k+ 3.5; na++144 05-15-15: wbc 6.9; hgb 11.7; hct 34.;3 mcv 86.8; plt 251; glucose 104; bun 8; creat 0.51; k+ 3.3; na++144; phenobarb 11.9  05-25-15: phenobarb 16.6  06-16-15: chol 146; ldl 79; trig 58; hdl 55; hgb a1c 5.3; vit d 11  08-17-15: wbc 6.4; hgb 11.5; hct 34.;4 mcv 85.1; plt 340; glucose 141; bun 16; creat 0.60; k+ 3.8; na++136; liver normal albumin 3.5; phenobarb 13.0; HIV: nr; urine culture: no growth;  blood culture: no growth  08-20-15: glucose 92; bun 10; creat 0.61; k+ 3.4; na++139 08-21-15: wbc 5.2;hgb 12.1; hct 35.0; mcv 85.0; plt 130  12-13-15: chol 167; ldl 90; trig 134; hdl 50; hgb a1c 5.1 vit D 21  01-17-16: wbc 6.4 hgb 12.4 hct 36.9; mcv 85.2; plt 325; glucose 72; bun 20; creat 0.51; k+ 4.3; na++141; liver normal albumin 3.9; phenobarb 19.1  02-14-16: phenobarbital 22 03-18-16: wbc 7.5; hgb 12.7; hct 36.6; mcv 84.3; plt 310; glucose 107; bun 16; creat 0.46; k+ 3.8; na++ 139; phenobarb 23.5     Review of Systems  Unable to perform ROS: Dementia      Physical Exam  Constitutional: No distress.  Obese   Eyes: Conjunctivae are normal.  Neck: Neck supple. No JVD present. No thyromegaly present.  Cardiovascular: Normal rate, regular rhythm and intact distal pulses.   Respiratory: Effort normal  and breath sounds normal. No respiratory distress. She has no wheezes.  GI: Soft. Bowel sounds are normal. She exhibits no distension. There is no tenderness.  Musculoskeletal: She exhibits  no edema.  Able to move all extremities   Lymphadenopathy:    She has no cervical adenopathy.  Neurological: She is alert.  Skin: Skin is warm and dry. She is not diaphoretic.  Psychiatric: She has a normal mood and affect.       ASSESSMENT/ PLAN:  1. Seizures:  will continue keppra 1500 mg twice daily  will continue phenobarbital 129.6 mg nightly her phenobarb level is 23.5   2. Hypertension: will continue  lisinopril  2.5 mg daily   3. Dyslipidemia: will continue lipitor 10 mg daily ldl is 90   4. Vit d deficiency: will continue vit d 50,000 units weekly  Vit D 21   5. Lower extremity edema: will continue lasix 20 mg twice daily   6. UI: is presently not on medications   Has not had a recent infection   7. Psychosis: is without change will continue seroquel 25 mg at 2 pm  and  50 mg nightly and will continue navane 10 mg nightly is followed by psych services.  She continues to receive benefits from this regimen; any adjustments could cause her psychological harm. She is able to socialize with others at this time.   8. Secondary parkinson's disease: will continue cogentin 1 mg twice daily   9. Anxiety: will continue klonopin 1 mg  daily     Synthia Innocent NP [facility]  Contact [phone] Monday through Friday 8am- 5pm  After hours call [phone]

## 2016-05-03 DIAGNOSIS — M79672 Pain in left foot: Secondary | ICD-10-CM | POA: Diagnosis not present

## 2016-05-03 DIAGNOSIS — M79671 Pain in right foot: Secondary | ICD-10-CM | POA: Diagnosis not present

## 2016-05-03 DIAGNOSIS — B351 Tinea unguium: Secondary | ICD-10-CM | POA: Diagnosis not present

## 2016-05-23 ENCOUNTER — Non-Acute Institutional Stay (SKILLED_NURSING_FACILITY): Payer: Medicare Other | Admitting: Adult Health

## 2016-05-23 ENCOUNTER — Encounter: Payer: Self-pay | Admitting: Adult Health

## 2016-05-23 DIAGNOSIS — I10 Essential (primary) hypertension: Secondary | ICD-10-CM | POA: Diagnosis not present

## 2016-05-23 DIAGNOSIS — E559 Vitamin D deficiency, unspecified: Secondary | ICD-10-CM | POA: Diagnosis not present

## 2016-05-23 DIAGNOSIS — E785 Hyperlipidemia, unspecified: Secondary | ICD-10-CM | POA: Diagnosis not present

## 2016-05-23 DIAGNOSIS — F411 Generalized anxiety disorder: Secondary | ICD-10-CM

## 2016-05-23 DIAGNOSIS — G2119 Other drug induced secondary parkinsonism: Secondary | ICD-10-CM | POA: Diagnosis not present

## 2016-05-23 DIAGNOSIS — R569 Unspecified convulsions: Secondary | ICD-10-CM

## 2016-05-23 DIAGNOSIS — F2089 Other schizophrenia: Secondary | ICD-10-CM | POA: Diagnosis not present

## 2016-05-23 DIAGNOSIS — R600 Localized edema: Secondary | ICD-10-CM | POA: Diagnosis not present

## 2016-05-23 DIAGNOSIS — F29 Unspecified psychosis not due to a substance or known physiological condition: Secondary | ICD-10-CM

## 2016-05-23 NOTE — Progress Notes (Signed)
Patient ID: Christine Pena, female   DOB: [birth year], 60 y.o.   MRN: [id number]   [facility] Room Number: 114-A Place of Service:  SNF (31)   CODE STATUS: DNR  Allergies  Allergen Reactions  . Depakote [Valproic Acid] Other (See Comments)    altered mental status/ high ammonia  . Haldol [Haloperidol Lactate] Other (See Comments)    Unknown reaction.    Chief Complaint  Patient presents with  . Medical Management of Chronic Issues    Follow up    HPI:  She is a long term resident of this facility being seen for the management of her chronic illnesses.  Overall there is little change in her status. She is unable to participate in the hpi or ros. There are no nursing concerns at this time.   Past Medical History:  Diagnosis Date  . Anxiety   . Dementia   . Hyperlipidemia   . Hypertension   . Mental retardation   . Obesity   . 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 Seizures (HCC)   . Vitamin D deficiency     History reviewed. No pertinent surgical history.  Social History   Social History  . Marital status: Single    Spouse name: N/A  . Number of children: 1  . Years of education: N/A   Occupational History  . Not on file.   Social History Main Topics  . Smoking status: Never Smoker  . Smokeless tobacco: Never Used  . Alcohol use No  . Drug use: No  . Sexual activity: Not Currently    Birth control/ protection: Post-menopausal   Other Topics Concern  . Not on file   Social History Narrative   Patient drinks about 5 glasses of tea daily.   Family History  Problem Relation Age of Onset  . Heart disease Mother       VITAL SIGNS BP 125/77   Pulse 72   Temp 98.4 F (36.9 C) (Oral)   Resp 18   Ht 5\' 5"  (1.651 m)   Wt 210 lb (95.3 kg)   SpO2 97%   BMI 34.95 kg/m   Patient's Medications  New Prescriptions   No medications on file  Previous Medications   ACETAMINOPHEN (TYLENOL) 500 MG TABLET    Take 500 mg  by mouth every 4 (four) hours as needed for mild pain or fever.    ATORVASTATIN (LIPITOR) 10 MG TABLET    Take 10 mg by mouth daily.   BENZTROPINE (COGENTIN) 1 MG TABLET    Take 1 mg by mouth 2 (two) times daily.   CHLORHEXIDINE (PERIDEX) 0.12 % SOLUTION    Use as directed 20 mLs in the mouth or throat 4 (four) times daily.    CLONAZEPAM (KLONOPIN) 0.5 MG TABLET    Take 0.5 mg by mouth daily.   CLONAZEPAM (KLONOPIN) 1 MG TABLET    Take 1 mg by mouth daily.    HYDROCORTISONE (ANUSOL-HC) 2.5 % RECTAL CREAM    Place 1 application rectally 2 (two) times daily as needed for hemorrhoids or itching.   LEVETIRACETAM (KEPPRA) 750 MG TABLET    Take 2 tablets (1,500 mg total) by mouth 2 (two) times daily.   LISINOPRIL (ZESTRIL) 2.5 MG TABLET    Take 1 tablet (2.5 mg total) by mouth daily.   LORAZEPAM IJ    Inject 1 mg as directed. Inject 1 mg IM every 15 min prn active seizures. Give 1  mg IM every 15 min x 3 PRN for active seizures.   PHENOBARBITAL (LUMINAL) 64.8 MG TABLET    Take 2 tablets (129.6 mg total) by mouth at bedtime.   QUETIAPINE (SEROQUEL) 25 MG TABLET    Take 25 mg by mouth daily. For psychosis take at 1400   QUETIAPINE (SEROQUEL) 50 MG TABLET    Take 50 mg by mouth at bedtime. Take 25 mg po QD at 1400   THIOTHIXENE (NAVANE) 10 MG CAPSULE    Take 10 mg by mouth at bedtime.    TORSEMIDE (DEMADEX) 10 MG TABLET    Take 10 mg by mouth 2 (two) times daily.   VITAMIN D, ERGOCALCIFEROL, (DRISDOL) 50000 UNITS CAPS CAPSULE    Take 50,000 Units by mouth every 7 (seven) days. Takes on Saturdays.   VITAMIN E (VITAMIN E) 200 UNIT CAPSULE    Take 400 Units by mouth daily.    ZONISAMIDE (ZONEGRAN) 50 MG CAPSULE    One capsule twice a day for 2 weeks, then take 2 capsules twice a day  Modified Medications   No medications on file  Discontinued Medications   FUROSEMIDE (LASIX) 20 MG TABLET    Take 20 mg by mouth 2 (two) times daily. Reported on 12/31/2015   TRIAMCINOLONE CREAM (KENALOG) 0.1 %    Apply to  buttocks groin/ back two times daily     SIGNIFICANT DIAGNOSTIC EXAMS  05-14-15: chest x-ray: 1. No evidence of acute traumatic injury allowing for low lung volumes and rotation. 2. Mild vascular congestion and bibasilar atelectasis.  05-14-15: ct of head and cervical spine: 1. No acute intracranial abnormality. Cerebellar atrophy is unchanged. 2. No acute fracture or subluxation in the cervical spine.  08-17-15: chest x-ray: Mild left midlung opacity may reflect atelectasis or possibly mild pneumonia, depending on the patient's symptoms. Lungs mildly hypoexpanded  08-29-15: chest x-ray: no acute cardiopulmonary disease      LABS REVIEWED:   05-25-15: phenobarb 16.6  06-16-15: chol 146; ldl 79; trig 58; hdl 55; hgb a1c 5.3; vit d 11  08-17-15: wbc 6.4; hgb 11.5; hct 34.;4 mcv 85.1; plt 340; glucose 141; bun 16; creat 0.60; k+ 3.8; na++136; liver normal albumin 3.5; phenobarb 13.0; HIV: nr; urine culture: no growth;  blood culture: no growth  08-20-15: glucose 92; bun 10; creat 0.61; k+ 3.4; na++139 08-21-15: wbc 5.2;hgb 12.1; hct 35.0; mcv 85.0; plt 130  12-13-15: chol 167; ldl 90; trig 134; hdl 50; hgb a1c 5.1 vit D 21  01-17-16: wbc 6.4 hgb 12.4 hct 36.9; mcv 85.2; plt 325; glucose 72; bun 20; creat 0.51; k+ 4.3; na++141; liver normal albumin 3.9; phenobarb 19.1  02-14-16: phenobarbital 22 03-18-16: wbc 7.5; hgb 12.7; hct 36.6; mcv 84.3; plt 310; glucose 107; bun 16; creat 0.46; k+ 3.8; na++ 139; phenobarb 23.5  03-29-16; wbc 9.4; hgb 12.;4 hct 36.0; mcv 84.1 plt 309; glucose 116; bun 18; creat 0.46; k+ 3.9; na++ 138; liver normal albumin 4.1; phenobarb 17.3     Review of Systems  Unable to perform ROS: Dementia      Physical Exam  Constitutional: No distress.  Obese   Eyes: Conjunctivae are normal.  Neck: Neck supple. No JVD present. No thyromegaly present.  Cardiovascular: Normal rate, regular rhythm and intact distal pulses.   Respiratory: Effort normal and breath sounds  normal. No respiratory distress. She has no wheezes.  GI: Soft. Bowel sounds are normal. She exhibits no distension. There is no tenderness.  Musculoskeletal: She exhibits no edema.  Able to move all extremities   Lymphadenopathy:    She has no cervical adenopathy.  Neurological: She is alert.  Skin: Skin is warm and dry. She is not diaphoretic.  Psychiatric: She has a normal mood and affect.       ASSESSMENT/ PLAN:  1. Seizures:  will continue keppra 1500 mg twice daily  will continue zonegran 100 mg twice daily phenobarbital 129.6 mg nightly her phenobarb level is 17.3   2. Hypertension: will continue  lisinopril  2.5 mg daily   3. Dyslipidemia: will continue lipitor 10 mg daily ldl is 90   4. Vit d deficiency: will continue vit d 50,000 units weekly  Vit D 21   5. Lower extremity edema: will continue demadex 10 mg twice daily   6. Psychosis: is without change will continue seroquel 25 mg at 2 pm  and  50 mg nightly and will continue navane 10 mg nightly is followed by psych services.  She continues to receive benefits from this regimen; any adjustments could cause her psychological harm. She is able to socialize with others at this time.   7. Secondary parkinson's disease: will continue cogentin 1 mg twice daily   8. Anxiety: will continue klonopin 1.5 mg  daily             MD is aware of resident's narcotic use and is in agreement with current plan of care. We will attempt to wean resident as apropriate   Synthia Innocent NP [facility]  Contact [phone] Monday through Friday 8am- 5pm  After hours call [phone]

## 2016-05-28 ENCOUNTER — Encounter: Payer: Self-pay | Admitting: Adult Health

## 2016-05-28 ENCOUNTER — Ambulatory Visit (INDEPENDENT_AMBULATORY_CARE_PROVIDER_SITE_OTHER): Payer: Medicare Other | Admitting: Adult Health

## 2016-05-28 VITALS — BP 108/66 | HR 68 | Resp 18 | Ht 65.0 in | Wt 210.0 lb

## 2016-05-28 DIAGNOSIS — F79 Unspecified intellectual disabilities: Secondary | ICD-10-CM

## 2016-05-28 DIAGNOSIS — R569 Unspecified convulsions: Secondary | ICD-10-CM

## 2016-05-28 NOTE — Progress Notes (Signed)
I reviewed note and agree with plan.   Suanne MarkerVIKRAM R. Amour Trigg, MD 05/28/2016, 5:06 PM Certified in Neurology, Neurophysiology and Neuroimaging  [facility] [address], [address] [place], [place] [address] [phone]

## 2016-05-28 NOTE — Patient Instructions (Signed)
Continue Zonegran 100 mg twice a day Caregiver/family should be present at all visits in order to give history If your symptoms worsen or you develop new symptoms please let us know.

## 2016-05-28 NOTE — Progress Notes (Signed)
PATIENT: Christine Pena DOB: 1956/02/03  REASON FOR VISIT: follow up- dental retardation and intractable seizures HISTORY FROM: patient  HISTORY OF PRESENT ILLNESS: Christine Pena is a 60 year old female with a history of mental retardation and intractable seizures. She returns today for follow-up. At the last visit she was started on Zonegran. The patient comes in today without a caregiver or family member. She is unable to give a history. She is only oriented to person. My nurse called the facility and they report that the patient has not had any seizure events. They state that she is taking Zonegran 100 mg twice a day. She remains on phenobarbital and Keppra. They feel that she is tolerating Zonegran well. They deny any new issues. She returns today for an evaluation.  HISTORY 03/25/16: Christine Pena is a 60 year old white female with a history of mental retardation and intractable seizures. The seizures are associated with vertical eye deviation, and unresponsiveness without convulsive movements. The patient may have events that last greater than 15 minutes. The patient recently went to the emergency room with a prolonged episode on 03/18/2016. The patient is on phenobarbital and Keppra. The patient has therapeutic phenobarbital levels. She is maximized on the Keppra. The patient is allergic to Depakote. She has secondary parkinsonism as well. She is nonambulatory in part secondary to this. She is minimally verbal. She returns to the office today for an evaluation.  REVIEW OF SYSTEMS: Out of a complete 14 system review of symptoms, the patient complains only of the following symptoms, and all other reviewed systems are negative.  See history of present illness  ALLERGIES: Allergies  Allergen Reactions  . Depakote [Valproic Acid] Other (See Comments)    altered mental status/ high ammonia  . Haldol [Haloperidol Lactate] Other (See Comments)    Unknown reaction.    HOME  MEDICATIONS: Outpatient Medications Prior to Visit  Medication Sig Dispense Refill  . acetaminophen (TYLENOL) 500 MG tablet Take 500 mg by mouth every 4 (four) hours as needed for mild pain or fever.     Marland Kitchen atorvastatin (LIPITOR) 10 MG tablet Take 10 mg by mouth daily.    . benztropine (COGENTIN) 1 MG tablet Take 1 mg by mouth 2 (two) times daily.    . chlorhexidine (PERIDEX) 0.12 % solution Use as directed 20 mLs in the mouth or throat 4 (four) times daily.     . clonazePAM (KLONOPIN) 0.5 MG tablet Take 0.5 mg by mouth daily.    . clonazePAM (KLONOPIN) 1 MG tablet Take 1 mg by mouth daily.     . hydrocortisone (ANUSOL-HC) 2.5 % rectal cream Place 1 application rectally 2 (two) times daily as needed for hemorrhoids or itching. 30 g prn  . levETIRAcetam (KEPPRA) 750 MG tablet Take 2 tablets (1,500 mg total) by mouth 2 (two) times daily.    Marland Kitchen lisinopril (ZESTRIL) 2.5 MG tablet Take 1 tablet (2.5 mg total) by mouth daily. 90 tablet prn  . LORAZEPAM IJ Inject 1 mg as directed. Inject 1 mg IM every 15 min prn active seizures. Give 1 mg IM every 15 min x 3 PRN for active seizures.    Marland Kitchen PHENobarbital (LUMINAL) 64.8 MG tablet Take 2 tablets (129.6 mg total) by mouth at bedtime. 60 tablet 0  . QUEtiapine (SEROQUEL) 25 MG tablet Take 25 mg by mouth daily. For psychosis take at 1400    . QUEtiapine (SEROQUEL) 50 MG tablet Take 50 mg by mouth at bedtime. Take 25 mg po QD  at 1400    . thiothixene (NAVANE) 10 MG capsule Take 10 mg by mouth at bedtime.     . torsemide (DEMADEX) 10 MG tablet Take 10 mg by mouth 2 (two) times daily.    . Vitamin D, Ergocalciferol, (DRISDOL) 50000 UNITS CAPS capsule Take 50,000 Units by mouth every 7 (seven) days. Takes on Saturdays.    . vitamin E (VITAMIN E) 200 UNIT capsule Take 400 Units by mouth daily.     Marland Kitchen zonisamide (ZONEGRAN) 50 MG capsule One capsule twice a day for 2 weeks, then take 2 capsules twice a day 120 capsule 3   No facility-administered medications prior to  visit.     PAST MEDICAL HISTORY: Past Medical History:  Diagnosis Date  . Anxiety   . Dementia   . Hyperlipidemia   . Hypertension   . Mental retardation   . Obesity   . 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 Seizures (HCC)   . Vitamin D deficiency     PAST SURGICAL HISTORY: No past surgical history on file.  FAMILY HISTORY: Family History  Problem Relation Age of Onset  . Heart disease Mother     SOCIAL HISTORY: Social History   Social History  . Marital status: Single    Spouse name: N/A  . Number of children: 1  . Years of education: N/A   Occupational History  . Not on file.   Social History Main Topics  . Smoking status: Never Smoker  . Smokeless tobacco: Never Used  . Alcohol use No  . Drug use: No  . Sexual activity: Not Currently    Birth control/ protection: Post-menopausal   Other Topics Concern  . Not on file   Social History Narrative   Patient drinks about 5 glasses of tea daily.      PHYSICAL EXAM  Vitals:   05/28/16 1048  BP: 108/66  Pulse: 68  Resp: 18  Weight: 210 lb (95.3 kg)  Height: 5\' 5"  (1.651 m)   Body mass index is 34.95 kg/m.  Generalized: Well developed, in no acute distress   Neurological examination  Mentation: Alert oriented to time, place, history taking. Follows all commands speech and language fluent Cranial nerve II-XII: Pupils were equal round reactive to light. Extraocular movements were full, visual field were full on confrontational test. Facial sensation and strength were normal. Uvula tongue midline. Head turning and shoulder shrug  were normal and symmetric. Motor: The motor testing reveals 5 over 5 strength of all 4 extremities. Good symmetric motor tone is noted throughout.  Sensory: Sensory testing is intact to soft touch on all 4 extremities. No evidence of extinction is noted.  Coordination: Cerebellar testing reveals good finger-nose-finger and heel-to-shin bilaterally.  Gait and station:  Gait is normal. Tandem gait is normal. Romberg is negative. No drift is seen.  Reflexes: Deep tendon reflexes are symmetric and normal bilaterally.   DIAGNOSTIC DATA (LABS, IMAGING, TESTING) - I reviewed patient records, labs, notes, testing and imaging myself where available.  Lab Results  Component Value Date   WBC 9.4 03/29/2016   HGB 12.4 03/29/2016   HCT 36.0 03/29/2016   MCV 84.1 03/29/2016   PLT 309 03/29/2016      Component Value Date/Time   NA 138 03/29/2016 2100   NA 141 01/17/2016   K 3.9 03/29/2016 2100   CL 105 03/29/2016 2100   CO2 25 03/29/2016 2100   GLUCOSE 116 (H) 03/29/2016 2100   BUN 18 03/29/2016 2100  BUN 20 01/17/2016   CREATININE 0.46 03/29/2016 2100   CALCIUM 9.6 03/29/2016 2100   PROT 7.0 03/29/2016 2100   ALBUMIN 4.1 03/29/2016 2100   AST 16 03/29/2016 2100   ALT 15 03/29/2016 2100   ALKPHOS 67 03/29/2016 2100   BILITOT 0.5 03/29/2016 2100   GFRNONAA >60 03/29/2016 2100   GFRAA >60 03/29/2016 2100   Lab Results  Component Value Date   CHOL 167 12/13/2015   HDL 50 12/13/2015   LDLCALC 90 12/13/2015   TRIG 134 12/13/2015   Lab Results  Component Value Date   HGBA1C 5.1 12/13/2015       ASSESSMENT AND PLAN 60 y.o. year old female  has a past medical history of Anxiety; Dementia; Hyperlipidemia; Hypertension; Mental retardation; Obesity; Secondary Parkinson disease (HCC) (05/04/2015); Seizures (HCC); and Vitamin D deficiency. here with:  1. Seizures 2. Mental retardation  The patient will continue on Zonegran, phenobarbital and Keppra. According to the facility she has not had any additional seizure events. She recently had blood work in July. It would be beneficial if the patient had a caregiver or family member with her at every visit. I have indicated this on her information sheet for her facility. Patient will follow-up in 6 months or sooner if needed.    Butch PennyMegan August Gosser, MSN, NP-C 05/28/2016, 10:37 AM [facility] [address], [address] [place], [place] [address] [phone]  n  y

## 2016-05-30 IMAGING — DX DG CHEST 1V
1 series · 1 of 1 positions shown · non-contrast
Comparison: 05/03/2014

CLINICAL DATA: Cough, weakness, altered mental status,
hypertension, history seizures

EXAM:
CHEST - 1 VIEW

[chest pa]
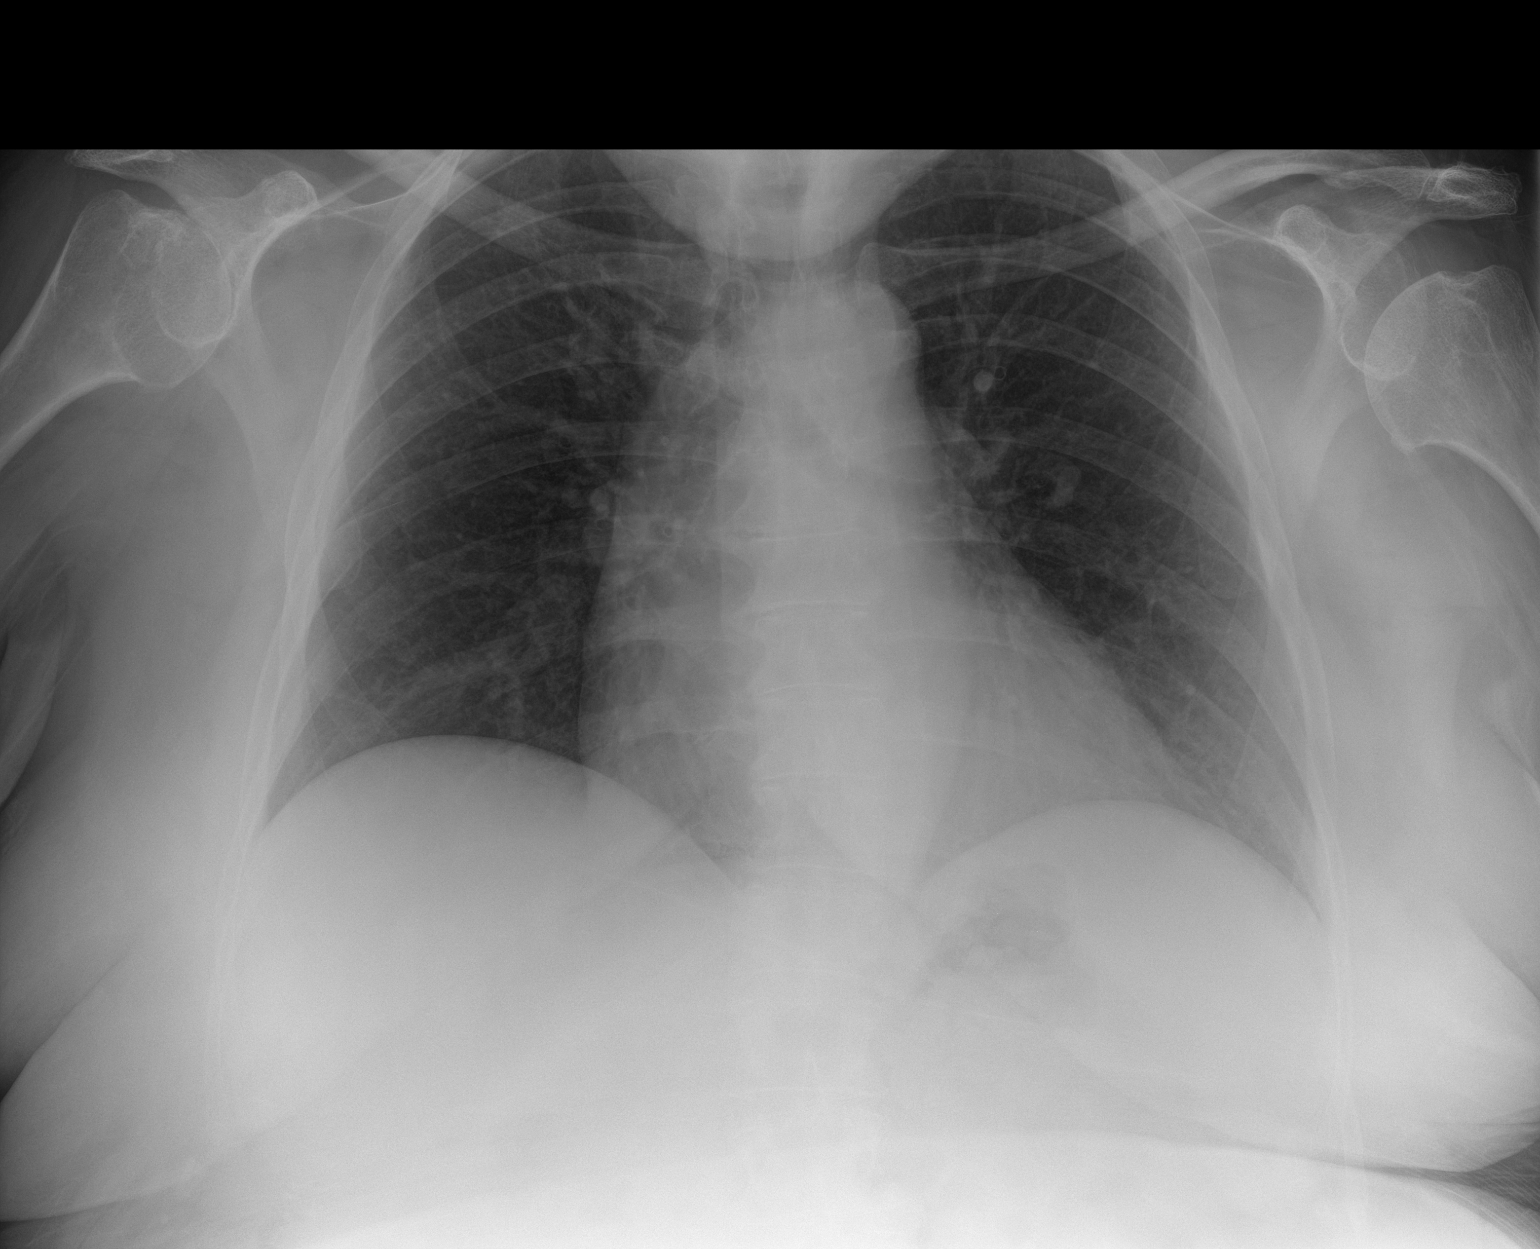

[1 of 1 positions shown; findings below may reference images not displayed]

FINDINGS: Borderline enlargement of cardiac silhouette.

Mediastinal contours and pulmonary vascularity normal.

Lungs clear.

No pleural effusion or pneumothorax.

Bones demineralized.
IMPRESSION: No acute abnormalities.

## 2016-05-31 DIAGNOSIS — E559 Vitamin D deficiency, unspecified: Secondary | ICD-10-CM | POA: Diagnosis not present

## 2016-06-18 DIAGNOSIS — R296 Repeated falls: Secondary | ICD-10-CM | POA: Diagnosis not present

## 2016-06-19 DIAGNOSIS — R296 Repeated falls: Secondary | ICD-10-CM | POA: Diagnosis not present

## 2016-06-20 DIAGNOSIS — R296 Repeated falls: Secondary | ICD-10-CM | POA: Diagnosis not present

## 2016-06-21 DIAGNOSIS — R296 Repeated falls: Secondary | ICD-10-CM | POA: Diagnosis not present

## 2016-06-22 DIAGNOSIS — R296 Repeated falls: Secondary | ICD-10-CM | POA: Diagnosis not present

## 2016-06-23 DIAGNOSIS — R296 Repeated falls: Secondary | ICD-10-CM | POA: Diagnosis not present

## 2016-06-24 ENCOUNTER — Non-Acute Institutional Stay (SKILLED_NURSING_FACILITY): Payer: Medicare Other | Admitting: Adult Health

## 2016-06-24 ENCOUNTER — Encounter: Payer: Self-pay | Admitting: Adult Health

## 2016-06-24 DIAGNOSIS — R600 Localized edema: Secondary | ICD-10-CM

## 2016-06-24 DIAGNOSIS — E782 Mixed hyperlipidemia: Secondary | ICD-10-CM | POA: Diagnosis not present

## 2016-06-24 DIAGNOSIS — F411 Generalized anxiety disorder: Secondary | ICD-10-CM

## 2016-06-24 DIAGNOSIS — E559 Vitamin D deficiency, unspecified: Secondary | ICD-10-CM | POA: Diagnosis not present

## 2016-06-24 DIAGNOSIS — I10 Essential (primary) hypertension: Secondary | ICD-10-CM

## 2016-06-24 DIAGNOSIS — G2119 Other drug induced secondary parkinsonism: Secondary | ICD-10-CM

## 2016-06-24 DIAGNOSIS — F29 Unspecified psychosis not due to a substance or known physiological condition: Secondary | ICD-10-CM | POA: Diagnosis not present

## 2016-06-24 DIAGNOSIS — R569 Unspecified convulsions: Secondary | ICD-10-CM | POA: Diagnosis not present

## 2016-06-24 NOTE — Progress Notes (Signed)
Patient ID: Christine Pena, female   DOB: 08-Sep-1956, 60 y.o.   MRN: [id number]   Location:   [name] Room Number: 114-A Place of Service:  SNF (31)   CODE STATUS: DNR  Allergies  Allergen Reactions  . Depakote [Valproic Acid] Other (See Comments)    altered mental status/ high ammonia  . Haldol [Haloperidol Lactate] Other (See Comments)    Unknown reaction.    Chief Complaint  Patient presents with  . Medical Management of Chronic Issues    Follow up    HPI:  She is a long term resident of this facility being seen for the management of her chronic illnesses. Overall her status is stable. Her weight is stable at 210 pounds. She does get out of bed on a daily basis. She is unable to participate in the hpi or ros.  There are no nursing concerns at this time.    Past Medical History:  Diagnosis Date  . Anxiety   . Dementia   . Hyperlipidemia   . Hypertension   . Mental retardation   . Obesity   . 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 Seizures (HCC)   . Vitamin D deficiency     History reviewed. No pertinent surgical history.  Social History   Social History  . Marital status: Single    Spouse name: N/A  . Number of children: 1  . Years of education: N/A   Occupational History  . Not on file.   Social History Main Topics  . Smoking status: Never Smoker  . Smokeless tobacco: Never Used  . Alcohol use No  . Drug use: No  . Sexual activity: Not Currently    Birth control/ protection: Post-menopausal   Other Topics Concern  . Not on file   Social History Narrative   Patient drinks about 5 glasses of tea daily.   Family History  Problem Relation Age of Onset  . Heart disease Mother       VITAL SIGNS BP 118/77   Pulse 80   Temp 98.1 F (36.7 C) (Oral)   Resp 17   Ht 5\' 5"  (1.651 m)   Wt 210 lb 2 oz (95.3 kg)   SpO2 99%   BMI 34.97 kg/m   Patient's Medications  New Prescriptions   No medications on file    Previous Medications   ACETAMINOPHEN (TYLENOL) 500 MG TABLET    Take 500 mg by mouth every 4 (four) hours as needed for mild pain or fever.    ATORVASTATIN (LIPITOR) 10 MG TABLET    Take 10 mg by mouth daily.   BENZTROPINE (COGENTIN) 1 MG TABLET    Take 1 mg by mouth 2 (two) times daily.   CHLORHEXIDINE (PERIDEX) 0.12 % SOLUTION    Use as directed 20 mLs in the mouth or throat 4 (four) times daily.    CLONAZEPAM (KLONOPIN) 0.5 MG TABLET    Take 0.5 mg by mouth daily prn    CLONAZEPAM (KLONOPIN) 1 MG TABLET    Take 1 mg by mouth daily.    HYDROCORTISONE (ANUSOL-HC) 2.5 % RECTAL CREAM    Place 1 application rectally 2 (two) times daily as needed for hemorrhoids or itching.   LEVETIRACETAM (KEPPRA) 750 MG TABLET    Take 2 tablets (1,500 mg total) by mouth 2 (two) times daily.   LISINOPRIL (ZESTRIL) 2.5 MG TABLET    Take 1 tablet (2.5 mg total) by mouth daily.   LORAZEPAM IJ  Inject 1 mg as directed. Inject 1 mg IM every 15 min prn active seizures. Give 1 mg IM every 15 min x 3 PRN for active seizures.   PHENOBARBITAL (LUMINAL) 64.8 MG TABLET    Take 2 tablets (129.6 mg total) by mouth at bedtime.   QUETIAPINE (SEROQUEL) 25 MG TABLET    Take 25 mg by mouth daily. For psychosis take at 1400   QUETIAPINE (SEROQUEL) 50 MG TABLET    Take 50 mg by mouth at bedtime. Take 25 mg po QD at 1400   THIOTHIXENE (NAVANE) 10 MG CAPSULE    Take 10 mg by mouth at bedtime.    TORSEMIDE (DEMADEX) 10 MG TABLET    Take 10 mg by mouth 2 (two) times daily.   VITAMIN D, ERGOCALCIFEROL, (DRISDOL) 50000 UNITS CAPS CAPSULE    Take 50,000 Units by mouth every 7 (seven) days. Takes on Saturdays.   VITAMIN E (VITAMIN E) 200 UNIT CAPSULE    Take 400 Units by mouth daily.    ZONISAMIDE (ZONEGRAN) 50 MG CAPSULE     take 2 capsules twice a day  Modified Medications   No medications on file  Discontinued Medications   No medications on file     SIGNIFICANT DIAGNOSTIC EXAMS  05-14-15: chest x-ray: 1. No evidence of acute  traumatic injury allowing for low lung volumes and rotation. 2. Mild vascular congestion and bibasilar atelectasis.  05-14-15: ct of head and cervical spine: 1. No acute intracranial abnormality. Cerebellar atrophy is unchanged. 2. No acute fracture or subluxation in the cervical spine.  08-17-15: chest x-ray: Mild left midlung opacity may reflect atelectasis or possibly mild pneumonia, depending on the patient's symptoms. Lungs mildly hypoexpanded  08-29-15: chest x-ray: no acute cardiopulmonary disease      LABS REVIEWED:   08-17-15: wbc 6.4; hgb 11.5; hct 34.;4 mcv 85.1; plt 340; glucose 141; bun 16; creat 0.60; k+ 3.8; na++136; liver normal albumin 3.5; phenobarb 13.0; HIV: nr; urine culture: no growth;  blood culture: no growth  08-20-15: glucose 92; bun 10; creat 0.61; k+ 3.4; na++139 08-21-15: wbc 5.2;hgb 12.1; hct 35.0; mcv 85.0; plt 130  12-13-15: chol 167; ldl 90; trig 134; hdl 50; hgb a1c 5.1 vit D 21  01-17-16: wbc 6.4 hgb 12.4 hct 36.9; mcv 85.2; plt 325; glucose 72; bun 20; creat 0.51; k+ 4.3; na++141; liver normal albumin 3.9; phenobarb 19.1  02-14-16: phenobarbital 22 03-18-16: wbc 7.5; hgb 12.7; hct 36.6; mcv 84.3; plt 310; glucose 107; bun 16; creat 0.46; k+ 3.8; na++ 139; phenobarb 23.5  03-29-16; wbc 9.4; hgb 12.;4 hct 36.0; mcv 84.1 plt 309; glucose 116; bun 18; creat 0.46; k+ 3.9; na++ 138; liver normal albumin 4.1; phenobarb 17.3  05-17-16: glucose 81; bun 14; creat 0.56; k+ 4.3; na++ 141  05-31-16: vit D 32    Review of Systems  Unable to perform ROS: Dementia      Physical Exam  Constitutional: No distress.  Obese   Eyes: Conjunctivae are normal.  Neck: Neck supple. No JVD present. No thyromegaly present.  Cardiovascular: Normal rate, regular rhythm and intact distal pulses.   Respiratory: Effort normal and breath sounds normal. No respiratory distress. She has no wheezes.  GI: Soft. Bowel sounds are normal. She exhibits no distension. There is no tenderness.    Musculoskeletal: She exhibits no edema.  Able to move all extremities   Lymphadenopathy:    She has no cervical adenopathy.  Neurological: She is alert.  Skin: Skin is warm and  dry. She is not diaphoretic.  Psychiatric: She has a normal mood and affect.       ASSESSMENT/ PLAN:  1. Seizures:  will continue keppra 1500 mg twice daily  will continue zonegran 100 mg twice daily phenobarbital 129.6 mg nightly her phenobarb level is 17.3 has prn ativan for seizure management   2. Hypertension: will continue  lisinopril  2.5 mg daily   3. Dyslipidemia: will continue lipitor 10 mg daily ldl is 90   4. Vit d deficiency: Vit d is 6232; will lower her vit d to 50,000 unit monthly  and will monitor   5. Lower extremity edema: will continue demadex 10 mg twice daily   6. Psychosis: is without change will continue seroquel 25 mg at 2 pm  and  50 mg nightly and will continue navane 10 mg nightly is followed by psych services.  She continues to receive benefits from this regimen; any adjustments could cause her psychological harm. She is able to socialize with others at this time.   7. Secondary parkinson's disease: will continue cogentin 1 mg twice daily   8. Anxiety: will continue klonopin 1 mg  daily and 0.5 mg daily as needed   Will check cbc; cmp lipids and hgb a1c     MD is aware of resident's narcotic use and is in agreement with current plan of care. We will attempt to wean resident as apropriate   Synthia Innocenteborah Green NP [facility]  Contact [phone] Monday through Friday 8am- 5pm  After hours call [phone]

## 2016-06-25 DIAGNOSIS — R296 Repeated falls: Secondary | ICD-10-CM | POA: Diagnosis not present

## 2016-06-26 DIAGNOSIS — R296 Repeated falls: Secondary | ICD-10-CM | POA: Diagnosis not present

## 2016-06-26 DIAGNOSIS — Z79899 Other long term (current) drug therapy: Secondary | ICD-10-CM | POA: Diagnosis not present

## 2016-06-26 LAB — BASIC METABOLIC PANEL
BUN: 18 mg/dL (ref 4–21)
CREATININE: 0.5 mg/dL (ref 0.5–1.1)
Glucose: 84 mg/dL
Potassium: 4.3 mmol/L (ref 3.4–5.3)
SODIUM: 140 mmol/L (ref 137–147)

## 2016-06-26 LAB — LIPID PANEL
CHOLESTEROL: 193 mg/dL (ref 0–200)
HDL: 50 mg/dL (ref 35–70)
LDL Cholesterol: 126 mg/dL
TRIGLYCERIDES: 87 mg/dL (ref 40–160)

## 2016-06-26 LAB — CBC AND DIFFERENTIAL
HEMATOCRIT: 37 % (ref 36–46)
Hemoglobin: 12.5 g/dL (ref 12.0–16.0)
Platelets: 299 10*3/uL (ref 150–399)
WBC: 6.4 10^3/mL

## 2016-06-26 LAB — HEPATIC FUNCTION PANEL
ALT: 9 U/L (ref 7–35)
AST: 11 U/L — AB (ref 13–35)
Alkaline Phosphatase: 63 U/L (ref 25–125)
BILIRUBIN, TOTAL: 0.3 mg/dL

## 2016-06-26 LAB — HEMOGLOBIN A1C: HEMOGLOBIN A1C: 4.8

## 2016-06-27 DIAGNOSIS — R296 Repeated falls: Secondary | ICD-10-CM | POA: Diagnosis not present

## 2016-06-29 DIAGNOSIS — R296 Repeated falls: Secondary | ICD-10-CM | POA: Diagnosis not present

## 2016-06-30 DIAGNOSIS — R296 Repeated falls: Secondary | ICD-10-CM | POA: Diagnosis not present

## 2016-07-01 DIAGNOSIS — R296 Repeated falls: Secondary | ICD-10-CM | POA: Diagnosis not present

## 2016-07-02 DIAGNOSIS — R296 Repeated falls: Secondary | ICD-10-CM | POA: Diagnosis not present

## 2016-07-03 DIAGNOSIS — R296 Repeated falls: Secondary | ICD-10-CM | POA: Diagnosis not present

## 2016-07-04 DIAGNOSIS — R296 Repeated falls: Secondary | ICD-10-CM | POA: Diagnosis not present

## 2016-07-06 DIAGNOSIS — R296 Repeated falls: Secondary | ICD-10-CM | POA: Diagnosis not present

## 2016-07-08 DIAGNOSIS — R296 Repeated falls: Secondary | ICD-10-CM | POA: Diagnosis not present

## 2016-07-09 DIAGNOSIS — R296 Repeated falls: Secondary | ICD-10-CM | POA: Diagnosis not present

## 2016-07-10 DIAGNOSIS — R296 Repeated falls: Secondary | ICD-10-CM | POA: Diagnosis not present

## 2016-07-11 DIAGNOSIS — R296 Repeated falls: Secondary | ICD-10-CM | POA: Diagnosis not present

## 2016-07-12 DIAGNOSIS — R296 Repeated falls: Secondary | ICD-10-CM | POA: Diagnosis not present

## 2016-07-15 DIAGNOSIS — R296 Repeated falls: Secondary | ICD-10-CM | POA: Diagnosis not present

## 2016-07-16 DIAGNOSIS — R296 Repeated falls: Secondary | ICD-10-CM | POA: Diagnosis not present

## 2016-07-17 DIAGNOSIS — R296 Repeated falls: Secondary | ICD-10-CM | POA: Diagnosis not present

## 2016-07-18 DIAGNOSIS — R296 Repeated falls: Secondary | ICD-10-CM | POA: Diagnosis not present

## 2016-07-25 ENCOUNTER — Non-Acute Institutional Stay (SKILLED_NURSING_FACILITY): Payer: Medicare Other | Admitting: Adult Health

## 2016-07-25 ENCOUNTER — Encounter: Payer: Self-pay | Admitting: Adult Health

## 2016-07-25 DIAGNOSIS — R600 Localized edema: Secondary | ICD-10-CM | POA: Diagnosis not present

## 2016-07-25 DIAGNOSIS — E782 Mixed hyperlipidemia: Secondary | ICD-10-CM | POA: Diagnosis not present

## 2016-07-25 DIAGNOSIS — F29 Unspecified psychosis not due to a substance or known physiological condition: Secondary | ICD-10-CM

## 2016-07-25 DIAGNOSIS — I10 Essential (primary) hypertension: Secondary | ICD-10-CM | POA: Diagnosis not present

## 2016-07-25 DIAGNOSIS — G2119 Other drug induced secondary parkinsonism: Secondary | ICD-10-CM | POA: Diagnosis not present

## 2016-07-25 DIAGNOSIS — R569 Unspecified convulsions: Secondary | ICD-10-CM | POA: Diagnosis not present

## 2016-07-25 DIAGNOSIS — Z79899 Other long term (current) drug therapy: Secondary | ICD-10-CM | POA: Diagnosis not present

## 2016-07-25 NOTE — Progress Notes (Signed)
Patient ID: Christine EagleDebra L Bittman, female   DOB: 12-23-1955, 60 y.o.   MRN: [id number]    Provider:  Synthia Innocenteborah Gwenetta Devos, NP [facility]  [facility] Room Number: 114-A Place of Service:  SNF (31)   PCP: [name], [name], DO Patient Care Team: Kirt BoysMonica Carter, DO as PCP - General (Internal Medicine) Sharee Holstereborah S Helio Lack, NP as Nurse Practitioner (Geriatric Medicine) [facility]  Extended Emergency Contact Information Primary Emergency Contact: Glasby,[name] Rutherford Guysay  [place] Home Phone: [phone] Mobile Phone: [phone] Relation: Brother Secondary Emergency Contact: Gilkerson,Mike  [place] Home Phone: [phone] Relation: Brother  Code Status: DNR Goals of Care: Advanced Directive information Advanced Directives 07/25/2016  Does patient have an advance directive? Yes  Type of Advance Directive Out of facility DNR (pink MOST or yellow form)  Does patient want to make changes to advanced directive? No - Patient declined  Copy of advanced directive(s) in chart? Yes  Would patient like information on creating an advanced directive? -  Pre-existing out of facility DNR order (yellow form or pink MOST form) -      Allergies  Allergen Reactions  . Depakote [Valproic Acid] Other (See Comments)    altered mental status/ high ammonia  . Haldol [Haloperidol Lactate] Other (See Comments)    Unknown reaction.     Chief Complaint  Patient presents with  . Annual Exam    Annual    HPI: Patient is a 60 y.o. female seen today for an annual comprehensive examination. She is unable to participate in the hpi or ros.  She has not been hospitalized over the past year. She does get out of bed on a daily basis. There are no nursing concerns at this time.    Past Medical History:  Diagnosis Date  . Anxiety   . Dementia   . Hyperlipidemia   . Hypertension   . Mental retardation   . Obesity   . Secondary Parkinson  disease (HCC) 05/04/2015  . Seizures (HCC)   . Vitamin D deficiency    History reviewed. No pertinent surgical history.  reports that she has never smoked. She has never used smokeless tobacco. She reports that she does not drink alcohol or use drugs. Social History   Social History  . Marital status: Single    Spouse name: N/A  . Number of children: 1  . Years of education: N/A   Occupational History  . Not on file.   Social History Main Topics  . Smoking status: Never Smoker  . Smokeless tobacco: Never Used  . Alcohol use No  . Drug use: No  . Sexual activity: Not Currently    Birth control/ protection: Post-menopausal   Other Topics Concern  . Not on file   Social History Narrative   Patient drinks about 5 glasses of tea daily.   Family History  Problem Relation Age of Onset  . Heart disease Mother     Vitals:   07/25/16 1346  BP: 124/69  Pulse: 73  Resp: (!) 22  Temp: 98.5 F (36.9 C)  TempSrc: Oral  SpO2: 99%  Weight: 210 lb (95.3 kg)  Height: 5\' 5"  (1.651 m)   Body mass index is 34.95 kg/m.    Medication List       Accurate as of 07/25/16  1:56 PM. Always use your most recent med list.          acetaminophen 500 MG tablet Commonly known as:  TYLENOL Take 500 mg by mouth every 4 (four) hours as needed for mild pain or fever.   atorvastatin 10 MG tablet Commonly known as:  LIPITOR Take 10 mg by mouth daily.   benztropine 1 MG tablet Commonly known as:  COGENTIN Take 1 mg by mouth 2 (two) times daily.   chlorhexidine 0.12 % solution Commonly known as:  PERIDEX Use as directed 20 mLs in the mouth or throat 4 (four) times daily.   clonazePAM 1 MG tablet Commonly known as:  KLONOPIN Take 1 mg by mouth daily.   clonazePAM 0.5 MG tablet Commonly known as:  KLONOPIN Take 0.5 mg by mouth daily as needed.   hydrocortisone 2.5 % rectal cream Commonly known as:  ANUSOL-HC Place 1 application rectally 2 (two) times daily as needed for  hemorrhoids or itching.   levETIRAcetam 750 MG tablet Commonly known as:  KEPPRA Take 2 tablets (1,500 mg total) by mouth 2 (two) times daily.   lisinopril 2.5 MG tablet Commonly known as:  ZESTRIL Take 1 tablet (2.5 mg total) by mouth daily.   LORAZEPAM IJ Inject 1 mg as directed. Inject 1 mg IM every 15 min prn active seizures. Give 1 mg IM every 15 min x 3 PRN for active seizures.   PHENobarbital 64.8 MG tablet Commonly known as:  LUMINAL Take 2 tablets (129.6 mg total) by mouth at bedtime.   QUEtiapine 50 MG tablet Commonly known as:  SEROQUEL Take 50 mg by mouth at bedtime. Take 25 mg po QD at 1400   QUEtiapine 25 MG tablet Commonly known as:  SEROQUEL Take 25 mg by mouth daily. For psychosis take at 1400   thiothixene 10 MG capsule Commonly known as:  NAVANE Take 10 mg by mouth at bedtime.   torsemide 10 MG tablet Commonly known as:  DEMADEX Take 10 mg by mouth 2 (two) times daily.   Vitamin D (Ergocalciferol) 50000 units Caps capsule Commonly known as:  DRISDOL Take 50,000 Units by mouth every 7 (seven) days. Takes on Saturdays.   vitamin E 200 UNIT capsule Generic drug:  vitamin E Take 400 Units by mouth daily.   zonisamide 50 MG capsule Commonly known as:  ZONEGRAN Take 100 mg by mouth 2 (two) times daily.        SIGNIFICANT DIAGNOSTIC EXAMS  05-14-15: chest x-ray: 1. No evidence of acute traumatic injury allowing for low lung volumes and rotation. 2. Mild vascular congestion and bibasilar atelectasis.  05-14-15: ct of head and cervical spine: 1. No acute intracranial abnormality. Cerebellar atrophy is unchanged. 2. No acute fracture or subluxation in the cervical spine.  08-17-15: chest x-ray: Mild left midlung opacity may reflect atelectasis or possibly mild pneumonia, depending on the patient's symptoms. Lungs mildly hypoexpanded  08-29-15: chest x-ray: no acute cardiopulmonary disease      LABS REVIEWED:   08-17-15: wbc 6.4; hgb 11.5; hct  34.;4 mcv 85.1; plt 340; glucose 141; bun 16; creat 0.60; k+ 3.8; na++136; liver normal albumin 3.5; phenobarb 13.0; HIV: nr; urine culture: no growth;  blood culture: no growth  08-20-15: glucose 92; bun 10; creat 0.61; k+ 3.4; na++139 08-21-15: wbc 5.2;hgb 12.1; hct 35.0; mcv 85.0; plt 130  12-13-15: chol 167; ldl 90; trig 134; hdl 50; hgb a1c 5.1 vit D 21  01-17-16: wbc 6.4 hgb 12.4 hct 36.9; mcv 85.2; plt 325; glucose 72; bun 20; creat 0.51; k+ 4.3; na++141; liver normal albumin 3.9; phenobarb 19.1  02-14-16: phenobarbital 22 03-18-16: wbc 7.5; hgb 12.7;  hct 36.6; mcv 84.3; plt 310; glucose 107; bun 16; creat 0.46; k+ 3.8; na++ 139; phenobarb 23.5  03-29-16; wbc 9.4; hgb 12.;4 hct 36.0; mcv 84.1 plt 309; glucose 116; bun 18; creat 0.46; k+ 3.9; na++ 138; liver normal albumin 4.1; phenobarb 17.3  05-17-16: glucose 81; bun 14; creat 0.56; k+ 4.3; na++ 141  05-31-16: vit D 32  06-26-16: wbc 6.4; hgb 12.5; hct 36.7; mcv 84.4; plt 299; glucose 84; bun 18; create 0.52; k+ 4.3; na++ 140; liver normal albumin 3.9; chol 193; ldl 126; trig 89; hdl 50; hgb a1c 4.8       Review of Systems  Unable to perform ROS: Dementia      Physical Exam  Constitutional: No distress.  Obese   Eyes: Conjunctivae are normal.  Neck: Neck supple. No JVD present. No thyromegaly present.  Cardiovascular: Normal rate, regular rhythm and intact distal pulses.   Respiratory: Effort normal and breath sounds normal. No respiratory distress. She has no wheezes.  GI: Soft. Bowel sounds are normal. She exhibits no distension. There is no tenderness.  Musculoskeletal: She exhibits no edema.  Able to move all extremities   Lymphadenopathy:    She has no cervical adenopathy.  Neurological: She is alert.  Skin: Skin is warm and dry. She is not diaphoretic.  Psychiatric: She has a normal mood and affect.       ASSESSMENT/ PLAN:  1. Seizures:  will continue keppra 1500 mg twice daily  will continue zonegran 100 mg twice  daily phenobarbital 129.6 mg nightly her phenobarb level is 17.3 has prn ativan for seizure management   2. Hypertension: will continue  lisinopril  2.5 mg daily   3. Dyslipidemia: will continue lipitor 10 mg daily ldl is 89   4. Vit d deficiency: Vit d is 1832; will continue vit d 50,000 weekly   and will monitor   5. Lower extremity edema: will continue demadex 10 mg twice daily   6. Psychosis: is without change will continue seroquel 25 mg at 2 pm  and  50 mg nightly and will continue navane 10 mg nightly is followed by psych services.  She continues to receive benefits from this regimen; any adjustments could cause her psychological harm. She is able to socialize with others at this time.   7. Secondary parkinson's disease: will continue cogentin 1 mg twice daily   8. Anxiety: will continue klonopin 1 mg  daily and 0.5 mg daily as needed   Health maintenance is up to date    Time spent with patient   45 minutes >50% time spent counseling; reviewing medical record; tests; labs; and developing future plan of care    MD is aware of resident's narcotic use and is in agreement with current plan of care. We will wean dosage as appropriate for resident   Synthia InnocentDeborah Fantashia Shupert NP [facility]  Contact [phone] Monday through Friday 8am- 5pm  After hours call [phone]

## 2016-08-21 ENCOUNTER — Ambulatory Visit: Payer: Self-pay | Admitting: Adult Health

## 2016-08-26 ENCOUNTER — Non-Acute Institutional Stay (SKILLED_NURSING_FACILITY): Payer: Medicare Other | Admitting: Adult Health

## 2016-08-26 ENCOUNTER — Encounter: Payer: Self-pay | Admitting: Adult Health

## 2016-08-26 DIAGNOSIS — R569 Unspecified convulsions: Secondary | ICD-10-CM | POA: Diagnosis not present

## 2016-08-26 DIAGNOSIS — F29 Unspecified psychosis not due to a substance or known physiological condition: Secondary | ICD-10-CM | POA: Diagnosis not present

## 2016-08-26 DIAGNOSIS — G2119 Other drug induced secondary parkinsonism: Secondary | ICD-10-CM | POA: Diagnosis not present

## 2016-08-26 DIAGNOSIS — E782 Mixed hyperlipidemia: Secondary | ICD-10-CM

## 2016-08-26 DIAGNOSIS — I10 Essential (primary) hypertension: Secondary | ICD-10-CM | POA: Diagnosis not present

## 2016-08-26 NOTE — Progress Notes (Signed)
Patient ID: Christine EagleDebra L Pena, female   DOB: May 10, 1956, 60 y.o.   MRN: [id number]    [facility]   [facility] Room Number: 114-A Place of Service:  SNF (31)   CODE STATUS: DNR  Allergies  Allergen Reactions  . Depakote [Valproic Acid] Other (See Comments)    altered mental status/ high ammonia  . Haldol [Haloperidol Lactate] Other (See Comments)    Unknown reaction.    Chief Complaint  Patient presents with  . Medical Management of Chronic Issues    Follow up    HPI:  She is a long term resident of this facility  being seen for the management of her chronic illnesses.  There is no significant change in her status. She is unable to fully participate in the hpi or ros. There are no nursing concerns at this time.   Past Medical History:  Diagnosis Date  . Anxiety   . Dementia   . Hyperlipidemia   . Hypertension   . Mental retardation   . Obesity   . 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 Seizures (HCC)   . Vitamin D deficiency     History reviewed. No pertinent surgical history.  Social History   Social History  . Marital status: Single    Spouse name: N/A  . Number of children: 1  . Years of education: N/A   Occupational History  . Not on file.   Social History Main Topics  . Smoking status: Never Smoker  . Smokeless tobacco: Never Used  . Alcohol use No  . Drug use: No  . Sexual activity: Not Currently    Birth control/ protection: Post-menopausal   Other Topics Concern  . Not on file   Social History Narrative   Patient drinks about 5 glasses of tea daily.   Family History  Problem Relation Age of Onset  . Heart disease Mother       VITAL SIGNS BP (!) 120/99   Pulse 84   Temp 98.1 F (36.7 C) (Oral)   Resp 16   Ht 5\' 5"  (1.651 m)   Wt 211 lb 2 oz (95.8 kg)   SpO2 96%   BMI 35.13 kg/m   Patient's Medications  New Prescriptions   No medications on file  Previous Medications   ACETAMINOPHEN (TYLENOL) 500 MG  TABLET    Take 500 mg by mouth every 4 (four) hours as needed for mild pain or fever.    ATORVASTATIN (LIPITOR) 10 MG TABLET    Take 10 mg by mouth daily.   BENZTROPINE (COGENTIN) 1 MG TABLET    Take 1 mg by mouth 2 (two) times daily.   CHLORHEXIDINE (PERIDEX) 0.12 % SOLUTION    Use as directed 20 mLs in the mouth or throat 4 (four) times daily.    CLONAZEPAM (KLONOPIN) 0.5 MG TABLET    Take 0.5 mg by mouth daily as needed.    CLONAZEPAM (KLONOPIN) 1 MG TABLET    Take 1 mg by mouth daily.    HYDROCORTISONE (ANUSOL-HC) 2.5 % RECTAL CREAM    Place 1 application rectally 2 (two) times daily as needed for hemorrhoids or itching.   LEVETIRACETAM (KEPPRA) 750 MG TABLET    Take 2 tablets (1,500 mg total) by mouth 2 (two) times daily.   LISINOPRIL (ZESTRIL) 2.5 MG TABLET    Take 1 tablet (2.5 mg total) by mouth daily.   LORAZEPAM IJ    Inject 1 mg as directed. Inject 1 mg  IM every 15 min prn active seizures. Give 1 mg IM every 15 min x 3 PRN for active seizures.   PHENOBARBITAL (LUMINAL) 64.8 MG TABLET    Take 2 tablets (129.6 mg total) by mouth at bedtime.   QUETIAPINE (SEROQUEL) 25 MG TABLET    Take 25 mg by mouth daily. For psychosis take at 1400   QUETIAPINE (SEROQUEL) 50 MG TABLET    Take 50 mg by mouth at bedtime. Take 25 mg po QD at 1400   THIOTHIXENE (NAVANE) 10 MG CAPSULE    Take 10 mg by mouth at bedtime.    TORSEMIDE (DEMADEX) 10 MG TABLET    Take 10 mg by mouth 2 (two) times daily.   VITAMIN D, ERGOCALCIFEROL, (DRISDOL) 50000 UNITS CAPS CAPSULE    Take 50,000 Units by mouth every 7 (seven) days. Takes on Saturdays.   VITAMIN E (VITAMIN E) 200 UNIT CAPSULE    Take 400 Units by mouth daily.    ZONISAMIDE (ZONEGRAN) 50 MG CAPSULE    Take 100 mg by mouth 2 (two) times daily.  Modified Medications   No medications on file  Discontinued Medications   No medications on file     SIGNIFICANT DIAGNOSTIC EXAMS  05-14-15: ct of head and cervical spine: 1. No acute intracranial abnormality.  Cerebellar atrophy is unchanged. 2. No acute fracture or subluxation in the cervical spine.  08-29-15: chest x-ray: no acute cardiopulmonary disease      LABS REVIEWED:   12-13-15: chol 167; ldl 90; trig 134; hdl 50; hgb a1c 5.1 vit D 21  01-17-16: wbc 6.4 hgb 12.4 hct 36.9; mcv 85.2; plt 325; glucose 72; bun 20; creat 0.51; k+ 4.3; na++141; liver normal albumin 3.9; phenobarb 19.1  02-14-16: phenobarbital 22 03-18-16: wbc 7.5; hgb 12.7; hct 36.6; mcv 84.3; plt 310; glucose 107; bun 16; creat 0.46; k+ 3.8; na++ 139; phenobarb 23.5  03-29-16; wbc 9.4; hgb 12.;4 hct 36.0; mcv 84.1 plt 309; glucose 116; bun 18; creat 0.46; k+ 3.9; na++ 138; liver normal albumin 4.1; phenobarb 17.3  05-17-16: glucose 81; bun 14; creat 0.56; k+ 4.3; na++ 141  05-31-16: vit D 32  06-26-16: wbc 6.4; hgb 12.5; hct 36.7; mcv 84.4; plt 299; glucose 84; bun 18; create 0.52; k+ 4.3; na++ 140; liver normal albumin 3.9; chol 193; ldl 126; trig 89; hdl 50; hgb a1c 4.8  07-25-16: keppra 33.0       Review of Systems  Unable to perform ROS: Dementia      Physical Exam  Constitutional: No distress.  Obese   Eyes: Conjunctivae are normal.  Neck: Neck supple. No JVD present. No thyromegaly present.  Cardiovascular: Normal rate, regular rhythm and intact distal pulses.   Respiratory: Effort normal and breath sounds normal. No respiratory distress. She has no wheezes.  GI: Soft. Bowel sounds are normal. She exhibits no distension. There is no tenderness.  Musculoskeletal: She exhibits no edema.  Able to move all extremities   Lymphadenopathy:    She has no cervical adenopathy.  Neurological: She is alert.  Skin: Skin is warm and dry. She is not diaphoretic.  Psychiatric: She has a normal mood and affect.       ASSESSMENT/ PLAN:  1. Seizures:  will continue keppra 1500 mg twice daily  will continue zonegran 100 mg twice daily phenobarbital 129.6 mg nightly her phenobarb level is 17.3 has prn ativan for seizure  management   2. Hypertension: will continue  lisinopril  2.5 mg daily  3. Dyslipidemia: will continue lipitor 10 mg daily ldl is 89   4. Vit d deficiency: Vit d is 33; will continue vit d 50,000 weekly   and will monitor   5. Lower extremity edema: will continue demadex 10 mg twice daily   6. Psychosis: is without change will continue seroquel 25 mg at 2 pm  and  50 mg nightly and will continue navane 10 mg nightly is followed by psych services.  She continues to receive benefits from this regimen; any adjustments could cause her psychological harm. She is able to socialize with others at this time.   7. Secondary parkinson's disease: will continue cogentin 1 mg twice daily   8. Anxiety: will continue klonopin 1 mg  daily and 0.5 mg daily as needed   9. Hypertension: will increase lisinopril to 5 mg daily will have staff monitor blood pressure daily for one week and will check bmp in one week.     MD is aware of resident's narcotic use and is in agreement with current plan of care. We will attempt to wean resident as apropriate   Synthia Innocent NP [facility]  Contact [phone] Monday through Friday 8am- 5pm  After hours call [phone]

## 2016-09-02 DIAGNOSIS — Z79899 Other long term (current) drug therapy: Secondary | ICD-10-CM | POA: Diagnosis not present

## 2016-09-19 IMAGING — CT CT MAXILLOFACIAL W/O CM
6 of 16 series · 16 of 47 positions shown, 17 images · non-contrast
Comparison: Prior study from 11/24/2014.

CLINICAL DATA: Initial evaluation for acute trauma, forehead
contusion.

EXAM:
CT HEAD WITHOUT CONTRAST
CT MAXILLOFACIAL WITHOUT CONTRAST
CT CERVICAL SPINE WITHOUT CONTRAST
TECHNIQUE: Multidetector CT imaging of the head, cervical spine, and
maxillofacial structures were performed using the standard protocol
without intravenous contrast. Multiplanar CT image reconstructions
of the cervical spine and maxillofacial structures were also
generated.

[Series 6: max soft 2.0 h31s · axial · 0.37mm/px · z∈[+179,+282]mm · 4 of 117 slices shown (1 of 2)]
[im 24/117  brain]
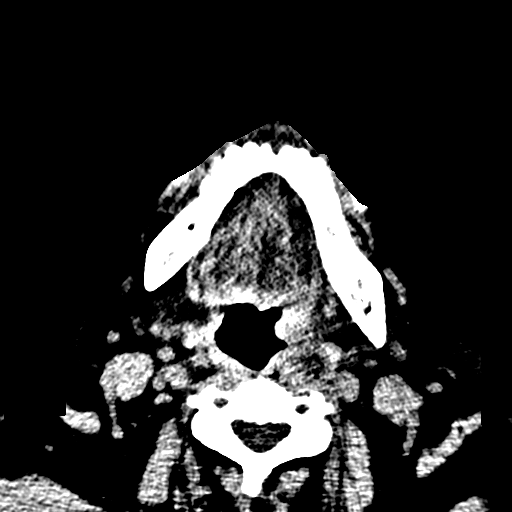
[im 47/117  brain]
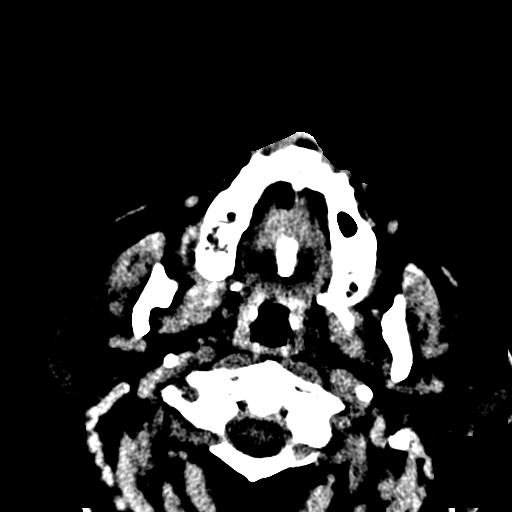
[im 70/117  brain]
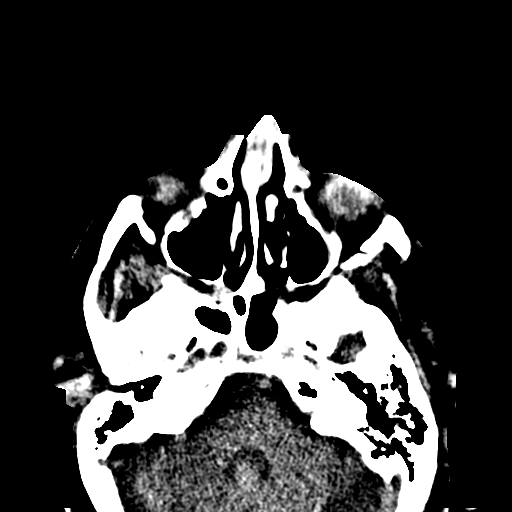
[im 93/117  brain]
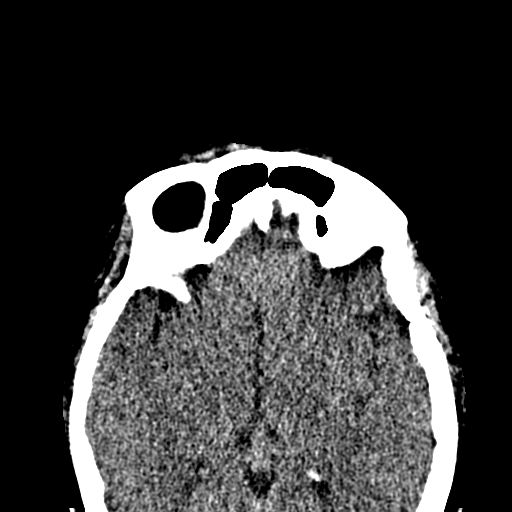

[Series 9: max st sagittal · sagittal · 0.34mm/px · 1 of 104 slices shown]
[im 52/104  bone]
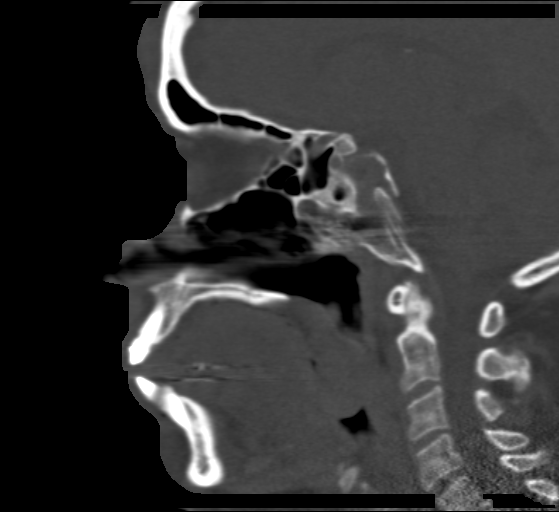

[Series 10: max bone coronal 2.0 spo · coronal · 0.35mm/px · 3 of 125 slices shown]
[im 32/125  bone]
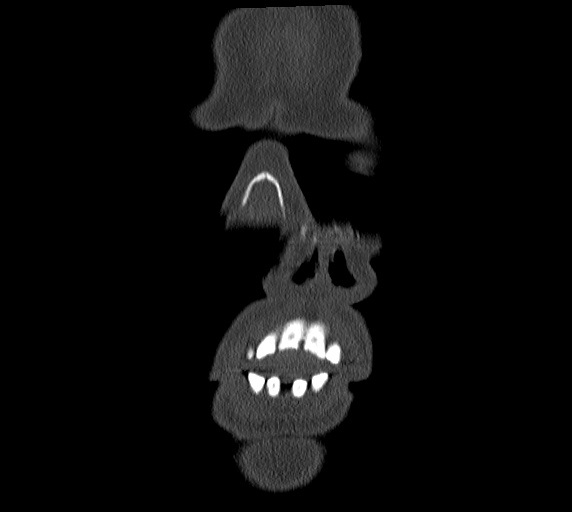
[im 63/125  bone]
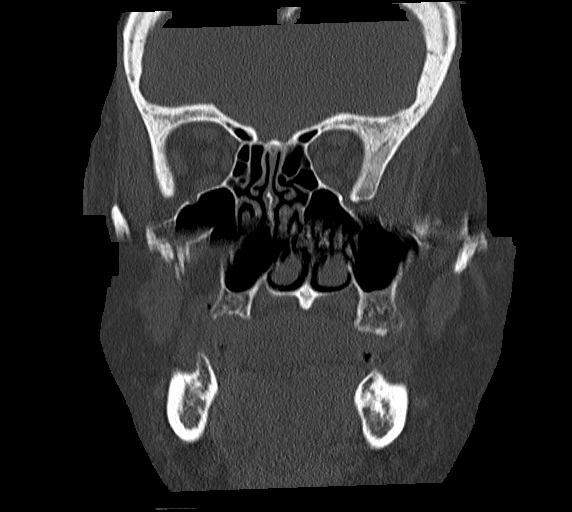
[im 94/125  bone]
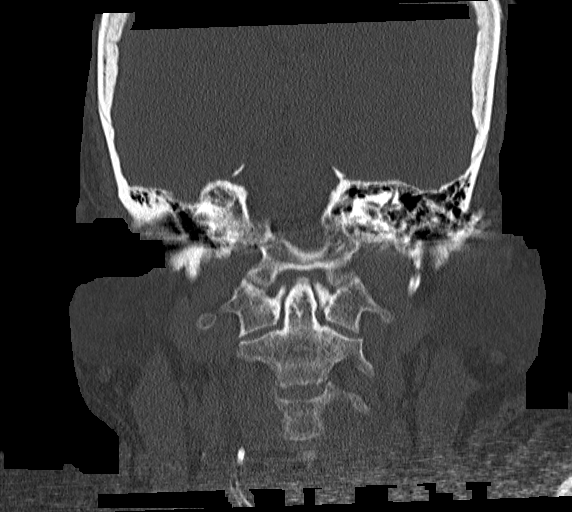

[Series 12: max soft 2.0 h31s · axial · 0.37mm/px · z∈[+208,+243]mm · 2 of 118 slices shown (2 of 2)]
[im 24/118  brain]
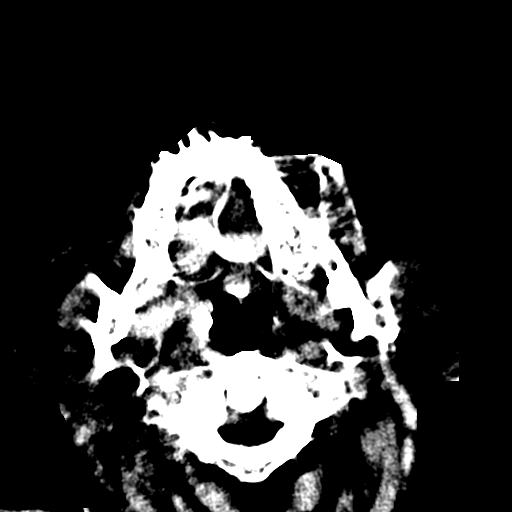
[im 47/118  brain]
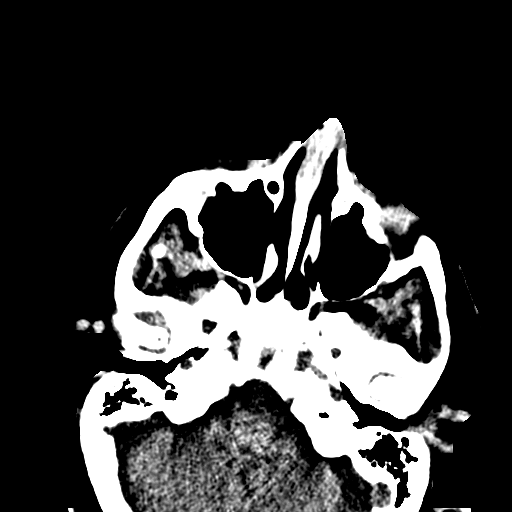

[Series 14: cervical st 2.0 b31s · axial · 0.32mm/px · z∈[+145,+245]mm · 3 of 102 slices shown, 4 images]
[im 26/102  brain]
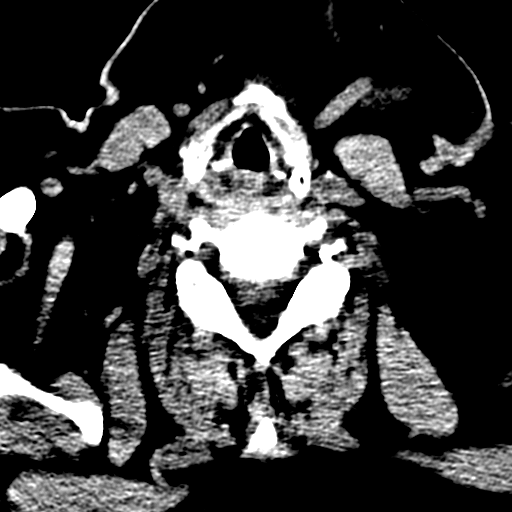
[im 26/102  bone]
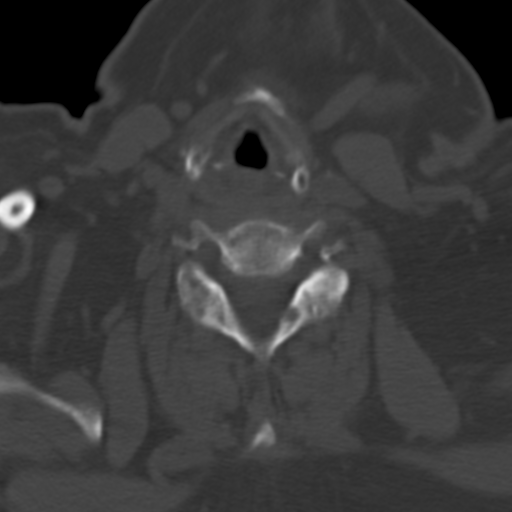
[im 51/102  bone]
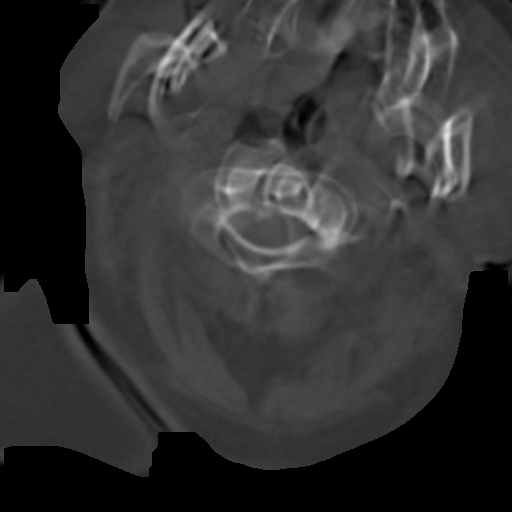
[im 76/102  bone]
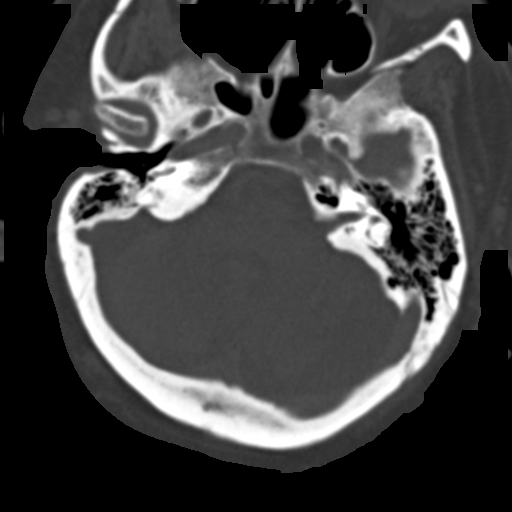

[Series 18: axial bone 2.0 · axial · 0.32mm/px · z∈[+107,+214]mm · 3 of 115 slices shown]
[im 29/115  bone]
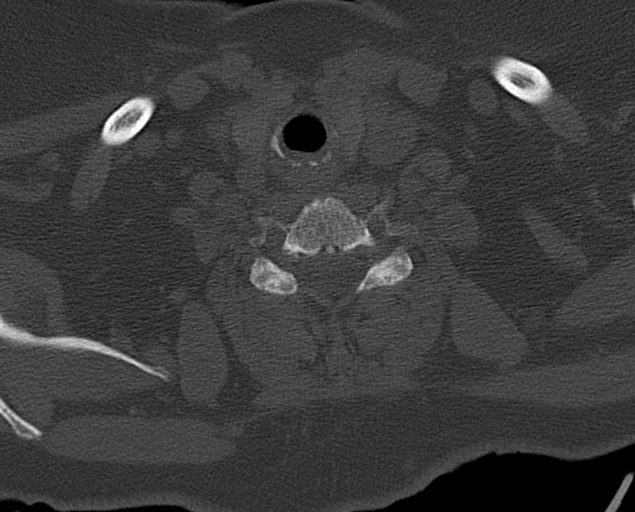
[im 58/115  bone]
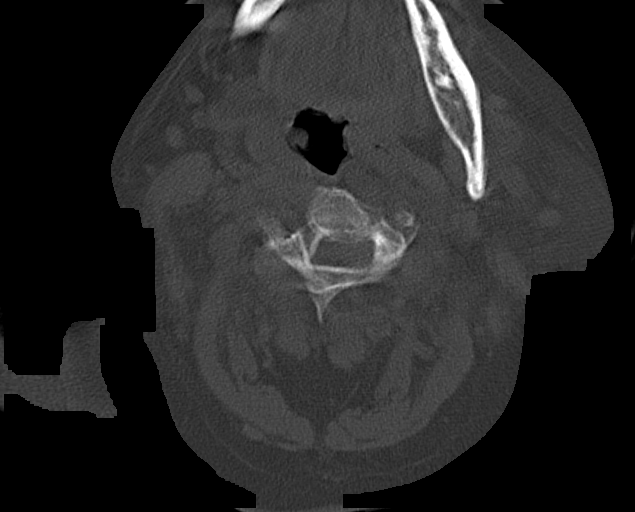
[im 86/115  bone]
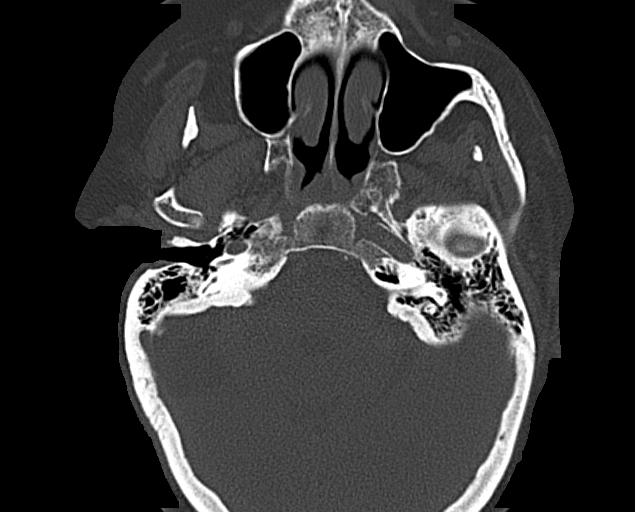

[16 of 47 positions shown; findings below may reference images not displayed]

FINDINGS: CT HEAD FINDINGS

Study is somewhat degraded by motion artifact.

Cerebellar atrophy noted. Mild chronic small vessel ischemic
changes. No acute intracranial hemorrhage or large vessel territory
infarct. No extra-axial fluid collection. No mass lesion or midline
shift. No hydrocephalus.

Contusion present at the central forehead. Scalp soft tissues
otherwise unremarkable. No acute abnormality about the orbits.

Calvarium intact.

CT MAXILLOFACIAL FINDINGS

Study is severely limited by motion artifact.

Globes are intact. No retro-orbital pathology. Bony orbits are
grossly intact without definite orbital floor fracture. No
intraorbital gas to suggest orbital fracture.

Zygomatic arches grossly intact. Mandible grossly intact. No
definite maxillary fracture. Irregularity about the nasal bones
favored to be chronic in nature with no definite overlying soft
tissue swelling. Pterygoid plates intact.

No significant soft tissue swelling seen elsewhere within the face.
Paranasal sinuses are clear. No mastoid effusion.

CT CERVICAL SPINE FINDINGS

Studies limited by motion artifact.

Vertebral bodies are normally aligned with preservation of the
normal cervical lordosis. Vertebral body heights grossly maintained.
Evaluation of the dens is somewhat limited by motion. No definite
fracture. No listhesis. Prevertebral soft tissues are normal.

No acute soft tissue abnormality within the neck. Visualized lungs
are clear.
IMPRESSION: CT HEAD:

1. No acute intracranial process.
2. Central forehead contusion.
3. Cerebellar atrophy.

CT MAXILLOFACIAL:

Limited study due to motion with no definite acute maxillofacial
injury identified.

CT CERVICAL SPINE:

Somewhat limited study due to motion. No definite acute traumatic
injury within the cervical spine.

## 2016-09-30 ENCOUNTER — Non-Acute Institutional Stay (SKILLED_NURSING_FACILITY): Payer: Medicare Other | Admitting: Adult Health

## 2016-09-30 ENCOUNTER — Encounter: Payer: Self-pay | Admitting: Adult Health

## 2016-09-30 DIAGNOSIS — R569 Unspecified convulsions: Secondary | ICD-10-CM

## 2016-09-30 DIAGNOSIS — E782 Mixed hyperlipidemia: Secondary | ICD-10-CM | POA: Diagnosis not present

## 2016-09-30 DIAGNOSIS — I10 Essential (primary) hypertension: Secondary | ICD-10-CM

## 2016-09-30 DIAGNOSIS — G2119 Other drug induced secondary parkinsonism: Secondary | ICD-10-CM | POA: Diagnosis not present

## 2016-09-30 DIAGNOSIS — F29 Unspecified psychosis not due to a substance or known physiological condition: Secondary | ICD-10-CM

## 2016-09-30 DIAGNOSIS — R600 Localized edema: Secondary | ICD-10-CM

## 2016-09-30 NOTE — Progress Notes (Signed)
[facility]    Place of Service:  SNF (31)   CODE STATUS: dnr  Allergies  Allergen Reactions  . Depakote [Valproic Acid] Other (See Comments)    altered mental status/ high ammonia  . Haldol [Haloperidol Lactate] Other (See Comments)    Unknown reaction.    Chief Complaint  Patient presents with  . Medical Management of Chronic Issues    HPI:  She is a lon term resident of this facility being seen for the management of her chronic illnesses. Overall her status is stable. She is unable to fully participate in the hpi or ros. She does continue to get out of bed daily. There are no nursing concerns at this time.    Past Medical History:  Diagnosis Date  . Anxiety   . Dementia   . Hyperlipidemia   . Hypertension   . Mental retardation   . Obesity   . 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 Seizures (HCC)   . Vitamin D deficiency     No past surgical history on file.  Social History   Social History  . Marital status: Single    Spouse name: N/A  . Number of children: 1  . Years of education: N/A   Occupational History  . Not on file.   Social History Main Topics  . Smoking status: Never Smoker  . Smokeless tobacco: Never Used  . Alcohol use No  . Drug use: No  . Sexual activity: Not Currently    Birth control/ protection: Post-menopausal   Other Topics Concern  . Not on file   Social History Narrative   Patient drinks about 5 glasses of tea daily.   Family History  Problem Relation Age of Onset  . Heart disease Mother       VITAL SIGNS BP (!) 142/78   Pulse 72   Temp (!) 96.8 F (36 C)   Resp 18   Ht 5\' 5"  (1.651 m)   Wt 212 lb 9.6 oz (96.4 kg)   SpO2 98%   BMI 35.38 kg/m   Patient's Medications  New Prescriptions   No medications on file  Previous Medications   ACETAMINOPHEN (TYLENOL) 500 MG TABLET    Take 500 mg by mouth every 4 (four) hours as needed for mild pain or fever.    ATORVASTATIN (LIPITOR) 10 MG  TABLET    Take 10 mg by mouth daily.   BENZTROPINE (COGENTIN) 1 MG TABLET    Take 1 mg by mouth 2 (two) times daily.   CHLORHEXIDINE (PERIDEX) 0.12 % SOLUTION    Use as directed 20 mLs in the mouth or throat 4 (four) times daily.    CLONAZEPAM (KLONOPIN) 0.5 MG TABLET    Take 0.5 mg by mouth daily as needed.    CLONAZEPAM (KLONOPIN) 1 MG TABLET    Take 1 mg by mouth daily.    HYDROCORTISONE (ANUSOL-HC) 2.5 % RECTAL CREAM    Place 1 application rectally 2 (two) times daily as needed for hemorrhoids or itching.   LEVETIRACETAM (KEPPRA) 750 MG TABLET    Take 2 tablets (1,500 mg total) by mouth 2 (two) times daily.   LISINOPRIL (PRINIVIL,ZESTRIL) 5 MG TABLET    Take 5 mg by mouth daily.   LORAZEPAM IJ    Inject 1 mg as directed. Inject 1 mg IM every 15 min prn active seizures. Give 1 mg IM every 15 min x 3 PRN for active seizures.   PHENOBARBITAL (  LUMINAL) 64.8 MG TABLET    Take 2 tablets (129.6 mg total) by mouth at bedtime.   QUETIAPINE (SEROQUEL) 25 MG TABLET    Take 25 mg by mouth daily. For psychosis take at 1400   QUETIAPINE (SEROQUEL) 50 MG TABLET    Take 50 mg by mouth at bedtime. Take 25 mg po QD at 1400   THIOTHIXENE (NAVANE) 10 MG CAPSULE    Take 10 mg by mouth at bedtime.    TORSEMIDE (DEMADEX) 10 MG TABLET    Take 10 mg by mouth 2 (two) times daily.   VITAMIN D, ERGOCALCIFEROL, (DRISDOL) 50000 UNITS CAPS CAPSULE    Take 50,000 Units by mouth every 7 (seven) days. Takes on Saturdays.   VITAMIN E (VITAMIN E) 200 UNIT CAPSULE    Take 400 Units by mouth daily.    ZONISAMIDE (ZONEGRAN) 50 MG CAPSULE    Take 100 mg by mouth 2 (two) times daily.  Modified Medications   No medications on file  Discontinued Medications   LISINOPRIL (ZESTRIL) 2.5 MG TABLET    Take 1 tablet (2.5 mg total) by mouth daily.     SIGNIFICANT DIAGNOSTIC EXAMS   05-14-15: ct of head and cervical spine: 1. No acute intracranial abnormality. Cerebellar atrophy is unchanged. 2. No acute fracture or subluxation in the  cervical spine.  08-29-15: chest x-ray: no acute cardiopulmonary disease      LABS REVIEWED:   12-13-15: chol 167; ldl 90; trig 134; hdl 50; hgb a1c 5.1 vit D 21  01-17-16: wbc 6.4 hgb 12.4 hct 36.9; mcv 85.2; plt 325; glucose 72; bun 20; creat 0.51; k+ 4.3; na++141; liver normal albumin 3.9; phenobarb 19.1  02-14-16: phenobarbital 22 03-18-16: wbc 7.5; hgb 12.7; hct 36.6; mcv 84.3; plt 310; glucose 107; bun 16; creat 0.46; k+ 3.8; na++ 139; phenobarb 23.5  03-29-16; wbc 9.4; hgb 12.;4 hct 36.0; mcv 84.1 plt 309; glucose 116; bun 18; creat 0.46; k+ 3.9; na++ 138; liver normal albumin 4.1; phenobarb 17.3  05-17-16: glucose 81; bun 14; creat 0.56; k+ 4.3; na++ 141  05-31-16: vit D 32  06-26-16: wbc 6.4; hgb 12.5; hct 36.7; mcv 84.4; plt 299; glucose 84; bun 18; create 0.52; k+ 4.3; na++ 140; liver normal albumin 3.9; chol 193; ldl 126; trig 89; hdl 50; hgb a1c 4.8  07-25-16: keppra 33.0  09-02-16: glucose 90; bun 19.0; creat 0.50; k+ 4.5; na++ 142; ca 10.3     Review of Systems  Unable to perform ROS: Dementia      Physical Exam  Constitutional: No distress.  Obese   Eyes: Conjunctivae are normal.  Neck: Neck supple. No JVD present. No thyromegaly present.  Cardiovascular: Normal rate, regular rhythm and intact distal pulses.   Respiratory: Effort normal and breath sounds normal. No respiratory distress. She has no wheezes.  GI: Soft. Bowel sounds are normal. She exhibits no distension. There is no tenderness.  Musculoskeletal: She exhibits no edema.  Able to move all extremities   Lymphadenopathy:    She has no cervical adenopathy.  Neurological: She is alert.  Skin: Skin is warm and dry. She is not diaphoretic.  Psychiatric: She has a normal mood and affect.       ASSESSMENT/ PLAN:  1. Seizures:  will continue keppra 1500 mg twice daily  will continue zonegran 100 mg twice daily phenobarbital 129.6 mg nightly her phenobarb level is 17.3 has prn ativan for seizure management    2. Hypertension: will increase lisinopril to 10 mg daily  will have nursing check blood pressure twice daily and will check bmp in one week.   3. Dyslipidemia: will continue lipitor 10 mg daily ldl is 89   4. Vit d deficiency: Vit d is 59; will continue vit d 50,000 weekly   and will monitor   5. Lower extremity edema: will continue demadex 10 mg twice daily   6. Schizophrenia with psychotic disorder: is without change will continue seroquel 25 mg at 2 pm  and  50 mg nightly and will continue navane 10 mg nightly is followed by psych services.  She continues to receive benefits from this regimen; any adjustments could cause her psychological harm. She is able to socialize with others at this time.   7. Secondary parkinson's disease: will continue cogentin 1 mg twice daily   8. Anxiety: will continue klonopin 1 mg  daily and 0.5 mg daily as needed      MD is aware of resident's narcotic use and is in agreement with current plan of care. We will attempt to wean resident as apropriate   Synthia Innocent NP [facility]  Contact [phone] Monday through Friday 8am- 5pm  After hours call [phone]

## 2016-10-03 DIAGNOSIS — Z79899 Other long term (current) drug therapy: Secondary | ICD-10-CM | POA: Diagnosis not present

## 2016-10-07 DIAGNOSIS — Z79899 Other long term (current) drug therapy: Secondary | ICD-10-CM | POA: Diagnosis not present

## 2016-10-07 LAB — BASIC METABOLIC PANEL
BUN: 19 mg/dL (ref 4–21)
CREATININE: 0.5 mg/dL (ref <=1.1)
Glucose: 114 mg/dL
POTASSIUM: 4.2 mmol/L (ref 3.4–5.3)
SODIUM: 141 mmol/L (ref 137–147)

## 2016-10-08 DIAGNOSIS — M79675 Pain in left toe(s): Secondary | ICD-10-CM | POA: Diagnosis not present

## 2016-10-08 DIAGNOSIS — I70203 Unspecified atherosclerosis of native arteries of extremities, bilateral legs: Secondary | ICD-10-CM | POA: Diagnosis not present

## 2016-10-08 DIAGNOSIS — M79674 Pain in right toe(s): Secondary | ICD-10-CM | POA: Diagnosis not present

## 2016-10-08 DIAGNOSIS — B351 Tinea unguium: Secondary | ICD-10-CM | POA: Diagnosis not present

## 2016-11-07 ENCOUNTER — Non-Acute Institutional Stay (SKILLED_NURSING_FACILITY): Payer: Medicare Other | Admitting: Adult Health

## 2016-11-07 ENCOUNTER — Encounter: Payer: Self-pay | Admitting: Adult Health

## 2016-11-07 DIAGNOSIS — E782 Mixed hyperlipidemia: Secondary | ICD-10-CM

## 2016-11-07 DIAGNOSIS — I10 Essential (primary) hypertension: Secondary | ICD-10-CM | POA: Diagnosis not present

## 2016-11-07 DIAGNOSIS — F79 Unspecified intellectual disabilities: Secondary | ICD-10-CM

## 2016-11-07 DIAGNOSIS — R600 Localized edema: Secondary | ICD-10-CM | POA: Diagnosis not present

## 2016-11-07 DIAGNOSIS — F29 Unspecified psychosis not due to a substance or known physiological condition: Secondary | ICD-10-CM | POA: Diagnosis not present

## 2016-11-07 DIAGNOSIS — G2119 Other drug induced secondary parkinsonism: Secondary | ICD-10-CM

## 2016-11-07 DIAGNOSIS — R569 Unspecified convulsions: Secondary | ICD-10-CM | POA: Diagnosis not present

## 2016-11-07 NOTE — Progress Notes (Signed)
[facility]    Place of Service:  SNF (31)   CODE STATUS: dnr  Allergies  Allergen Reactions  . Depakote [Valproic Acid] Other (See Comments)    altered mental status/ high ammonia  . Haldol [Haloperidol Lactate] Other (See Comments)    Unknown reaction.    Chief Complaint  Patient presents with  . Medical Management of Chronic Issues    HPI:  She is a long term resident of this facility being seen for the management of her chronic illnesses. Overall there is little change in her status. She is unable to participate in the hpi or ros. She does continue to get out of bed on daily basis. There are no nursing concerns at this time    Past Medical History:  Diagnosis Date  . Anxiety   . Dementia   . Hyperlipidemia   . Hypertension   . Mental retardation   . Obesity   . 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 Seizures (HCC)   . Vitamin D deficiency     No past surgical history on file.  Social History   Social History  . Marital status: Single    Spouse name: N/A  . Number of children: 1  . Years of education: N/A   Occupational History  . Not on file.   Social History Main Topics  . Smoking status: Never Smoker  . Smokeless tobacco: Never Used  . Alcohol use No  . Drug use: No  . Sexual activity: Not Currently    Birth control/ protection: Post-menopausal   Other Topics Concern  . Not on file   Social History Narrative   Patient drinks about 5 glasses of tea daily.   Family History  Problem Relation Age of Onset  . Heart disease Mother       VITAL SIGNS BP 128/76   Pulse 79   Temp 97.1 F (36.2 C)   Resp 18   Ht 5\' 5"  (1.651 m)   Wt 214 lb (97.1 kg)   SpO2 96%   BMI 35.61 kg/m   Patient's Medications  New Prescriptions   No medications on file  Previous Medications   ACETAMINOPHEN (TYLENOL) 500 MG TABLET    Take 500 mg by mouth every 4 (four) hours as needed for mild pain or fever.    ATORVASTATIN (LIPITOR) 10  MG TABLET    Take 10 mg by mouth daily.   BENZTROPINE (COGENTIN) 1 MG TABLET    Take 1 mg by mouth 2 (two) times daily.   CHLORHEXIDINE (PERIDEX) 0.12 % SOLUTION    Use as directed 20 mLs in the mouth or throat 4 (four) times daily.    CLONAZEPAM (KLONOPIN) 0.5 MG TABLET    Take 0.5 mg by mouth daily as needed.    CLONAZEPAM (KLONOPIN) 1 MG TABLET    Take 1 mg by mouth daily.    HYDROCORTISONE (ANUSOL-HC) 2.5 % RECTAL CREAM    Place 1 application rectally 2 (two) times daily as needed for hemorrhoids or itching.   LEVETIRACETAM (KEPPRA) 750 MG TABLET    Take 2 tablets (1,500 mg total) by mouth 2 (two) times daily.   LISINOPRIL (PRINIVIL,ZESTRIL) 10 MG TABLET    Take 10 mg by mouth daily.   LORAZEPAM IJ    Inject 1 mg as directed. Inject 1 mg IM every 15 min prn active seizures. Give 1 mg IM every 15 min x 3 PRN for active seizures.   PHENOBARBITAL (  LUMINAL) 64.8 MG TABLET    Take 2 tablets (129.6 mg total) by mouth at bedtime.   QUETIAPINE (SEROQUEL) 25 MG TABLET    Take 25 mg by mouth daily. For psychosis take at 1400   QUETIAPINE (SEROQUEL) 50 MG TABLET    Take 50 mg by mouth at bedtime. Take 25 mg po QD at 1400   THIOTHIXENE (NAVANE) 10 MG CAPSULE    Take 10 mg by mouth at bedtime.    TORSEMIDE (DEMADEX) 10 MG TABLET    Take 10 mg by mouth 2 (two) times daily.   VITAMIN D, ERGOCALCIFEROL, (DRISDOL) 50000 UNITS CAPS CAPSULE    Take 50,000 Units by mouth every 7 (seven) days. Takes on Saturdays.   VITAMIN E (VITAMIN E) 200 UNIT CAPSULE    Take 400 Units by mouth daily.    ZONISAMIDE (ZONEGRAN) 50 MG CAPSULE    Take 100 mg by mouth 2 (two) times daily.  Modified Medications   No medications on file  Discontinued Medications   No medications on file     SIGNIFICANT DIAGNOSTIC EXAMS  05-14-15: ct of head and cervical spine: 1. No acute intracranial abnormality. Cerebellar atrophy is unchanged. 2. No acute fracture or subluxation in the cervical spine.  08-29-15: chest x-ray: no acute  cardiopulmonary disease      LABS REVIEWED:   12-13-15: chol 167; ldl 90; trig 134; hdl 50; hgb a1c 5.1 vit D 21  01-17-16: wbc 6.4 hgb 12.4 hct 36.9; mcv 85.2; plt 325; glucose 72; bun 20; creat 0.51; k+ 4.3; na++141; liver normal albumin 3.9; phenobarb 19.1  02-14-16: phenobarbital 22 03-18-16: wbc 7.5; hgb 12.7; hct 36.6; mcv 84.3; plt 310; glucose 107; bun 16; creat 0.46; k+ 3.8; na++ 139; phenobarb 23.5  03-29-16; wbc 9.4; hgb 12.;4 hct 36.0; mcv 84.1 plt 309; glucose 116; bun 18; creat 0.46; k+ 3.9; na++ 138; liver normal albumin 4.1; phenobarb 17.3  05-17-16: glucose 81; bun 14; creat 0.56; k+ 4.3; na++ 141  05-31-16: vit D 32  06-26-16: wbc 6.4; hgb 12.5; hct 36.7; mcv 84.4; plt 299; glucose 84; bun 18; create 0.52; k+ 4.3; na++ 140; liver normal albumin 3.9; chol 193; ldl 126; trig 89; hdl 50; hgb a1c 4.8  07-25-16: keppra 33.0  09-02-16: glucose 90; bun 19.0; creat 0.50; k+ 4.5; na++ 142; ca 10.3 10-07-16: glucose 114; bun 19.3; creat 0.45; k+ 4.2; na++ 141; ca 10.4     Review of Systems  Unable to perform ROS: Dementia      Physical Exam  Constitutional: No distress.  Obese   Eyes: Conjunctivae are normal.  Neck: Neck supple. No JVD present. No thyromegaly present.  Cardiovascular: Normal rate, regular rhythm and intact distal pulses.   Respiratory: Effort normal and breath sounds normal. No respiratory distress. She has no wheezes.  GI: Soft. Bowel sounds are normal. She exhibits no distension. There is no tenderness.  Musculoskeletal: She exhibits no edema.  Able to move all extremities   Lymphadenopathy:    She has no cervical adenopathy.  Neurological: She is alert.  Skin: Skin is warm and dry. She is not diaphoretic.  Psychiatric: She has a normal mood and affect.       ASSESSMENT/ PLAN:  1. Seizures:  will continue keppra 1500 mg twice daily  will continue zonegran 100 mg twice daily phenobarbital 129.6 mg nightly her phenobarb level is 17.3 has prn ativan for  seizure management   2. Hypertension: will continue  lisinopril  10 mg daily  3. Dyslipidemia: will continue lipitor 10 mg daily ldl is 89   4. Vit d deficiency: Vit d is 51; will continue vit d 50,000 weekly   and will monitor   5. Lower extremity edema: will continue demadex 10 mg twice daily   6. Schizophrenia with psychotic disorder: is without change will continue seroquel 25 mg at 2 pm  and  50 mg nightly and will continue navane 10 mg nightly is followed by psych services.  She continues to receive benefits from this regimen; any adjustments could cause her psychological harm. She is able to socialize with others at this time.   7. Secondary parkinson's disease: will continue cogentin 1 mg twice daily   8. Anxiety: will continue klonopin 1 mg  daily and 0.5 mg daily as needed      MD is aware of resident's narcotic use and is in agreement with current plan of care. We will attempt to wean resident as apropriate   Synthia Innocent NP [facility]  Contact [phone] Monday through Friday 8am- 5pm  After hours call [phone]

## 2016-11-26 ENCOUNTER — Encounter: Payer: Self-pay | Admitting: Adult Health

## 2016-11-26 ENCOUNTER — Ambulatory Visit (INDEPENDENT_AMBULATORY_CARE_PROVIDER_SITE_OTHER): Payer: Medicare Other | Admitting: Adult Health

## 2016-11-26 VITALS — BP 108/70 | HR 70 | Resp 20

## 2016-11-26 DIAGNOSIS — F79 Unspecified intellectual disabilities: Secondary | ICD-10-CM | POA: Diagnosis not present

## 2016-11-26 DIAGNOSIS — R569 Unspecified convulsions: Secondary | ICD-10-CM

## 2016-11-26 DIAGNOSIS — Z5181 Encounter for therapeutic drug level monitoring: Secondary | ICD-10-CM | POA: Diagnosis not present

## 2016-11-26 NOTE — Progress Notes (Signed)
I have read the note, and I agree with the clinical assessment and plan.  WILLIS,CHARLES KEITH   

## 2016-11-26 NOTE — Patient Instructions (Signed)
Continue Phenobarbital, Keppra and Zonegran Blood work today If you have any seizure events please let us know.

## 2016-11-26 NOTE — Progress Notes (Signed)
PATIENT: Christine Pena DOB: January 05, 1956  REASON FOR VISIT: follow up- mental retardation, intractable seizures HISTORY FROM: patient  HISTORY OF PRESENT ILLNESS: Christine Pena is a 61 year old female with a history of mental retardation and intractable seizures. She returns today for follow-up. She is currently on phenobarbital, Keppra and Zonegran. She has an aide that is with her today however the aide has limited information about the patient. The patient is unable to give [place] any information. My nurse did call the facility and they reported the patient has not had any seizure events. She seems to be tolerating the medication well. She uses a wheelchair when ambulating. She returns today for an evaluation.   HISTORY 05/28/16: Christine Pena is a 61 year old female with a history of mental retardation and intractable seizures. She returns today for follow-up. At the last visit she was started on Zonegran. The patient comes in today without a caregiver or family member. She is unable to give a history. She is only oriented to person. My nurse called the facility and they report that the patient has not had any seizure events. They state that she is taking Zonegran 100 mg twice a day. She remains on phenobarbital and Keppra. They feel that she is tolerating Zonegran well. They deny any new issues. She returns today for an evaluation.  HISTORY 03/25/16: Christine Pena is a 61 year old white female with a history of mental retardation and intractable seizures. The seizures are associated with vertical eye deviation, and unresponsiveness without convulsive movements. The patient may have events that last greater than 15 minutes. The patient recently went to the emergency room with a prolonged episode on 03/18/2016. The patient is on phenobarbital and Keppra. The patient has therapeutic phenobarbital levels. She is maximized on the Keppra. The patient is allergic to Depakote. She has secondary parkinsonism as  well. She is nonambulatory in part secondary to this. She is minimally verbal. She returns to the office today for an evaluation.  REVIEW OF SYSTEMS: Out of a complete 14 system review of symptoms, the patient complains only of the following symptoms, and all other reviewed systems are negative.  See history of present illness  ALLERGIES: Allergies  Allergen Reactions  . Depakote [Valproic Acid] Other (See Comments)    altered mental status/ high ammonia  . Haldol [Haloperidol Lactate] Other (See Comments)    Unknown reaction.    HOME MEDICATIONS: Outpatient Medications Prior to Visit  Medication Sig Dispense Refill  . acetaminophen (TYLENOL) 500 MG tablet Take 500 mg by mouth every 4 (four) hours as needed for mild pain or fever.     Marland Kitchen atorvastatin (LIPITOR) 10 MG tablet Take 10 mg by mouth daily.    . benztropine (COGENTIN) 1 MG tablet Take 1 mg by mouth 2 (two) times daily.    . chlorhexidine (PERIDEX) 0.12 % solution Use as directed 20 mLs in the mouth or throat 4 (four) times daily.     . clonazePAM (KLONOPIN) 0.5 MG tablet Take 0.5 mg by mouth daily as needed.     . clonazePAM (KLONOPIN) 1 MG tablet Take 1 mg by mouth daily.     . hydrocortisone (ANUSOL-HC) 2.5 % rectal cream Place 1 application rectally 2 (two) times daily as needed for hemorrhoids or itching. 30 g prn  . levETIRAcetam (KEPPRA) 750 MG tablet Take 2 tablets (1,500 mg total) by mouth 2 (two) times daily.    Marland Kitchen lisinopril (PRINIVIL,ZESTRIL) 5 MG tablet Take 5 mg by mouth daily.    Marland Kitchen  LORAZEPAM IJ Inject 1 mg as directed. Inject 1 mg IM every 15 min prn active seizures. Give 1 mg IM every 15 min x 3 PRN for active seizures.    Marland Kitchen. PHENobarbital (LUMINAL) 64.8 MG tablet Take 2 tablets (129.6 mg total) by mouth at bedtime. 60 tablet 0  . QUEtiapine (SEROQUEL) 25 MG tablet Take 25 mg by mouth daily. For psychosis take at 1400    . QUEtiapine (SEROQUEL) 50 MG tablet Take 50 mg by mouth at bedtime. Take 25 mg po QD at 1400      . thiothixene (NAVANE) 10 MG capsule Take 10 mg by mouth at bedtime.     . torsemide (DEMADEX) 10 MG tablet Take 10 mg by mouth 2 (two) times daily.    . Vitamin D, Ergocalciferol, (DRISDOL) 50000 UNITS CAPS capsule Take 50,000 Units by mouth every 7 (seven) days. Takes on Saturdays.    . vitamin E (VITAMIN E) 200 UNIT capsule Take 400 Units by mouth daily.     Marland Kitchen. zonisamide (ZONEGRAN) 50 MG capsule Take 100 mg by mouth 2 (two) times daily.     No facility-administered medications prior to visit.     PAST MEDICAL HISTORY: Past Medical History:  Diagnosis Date  . Anxiety   . Dementia   . Hyperlipidemia   . Hypertension   . Mental retardation   . Obesity   . 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 Seizures (HCC)   . Vitamin D deficiency     PAST SURGICAL HISTORY: No past surgical history on file.  FAMILY HISTORY: Family History  Problem Relation Age of Onset  . Heart disease Mother     SOCIAL HISTORY: Social History   Social History  . Marital status: Single    Spouse name: N/A  . Number of children: 1  . Years of education: N/A   Occupational History  . Not on file.   Social History Main Topics  . Smoking status: Never Smoker  . Smokeless tobacco: Never Used  . Alcohol use No  . Drug use: No  . Sexual activity: Not Currently    Birth control/ protection: Post-menopausal   Other Topics Concern  . Not on file   Social History Narrative   Patient drinks about 5 glasses of tea daily.      PHYSICAL EXAM  Vitals:   11/26/16 1019  BP: 108/70  Pulse: 70  Resp: 20   There is no height or weight on file to calculate BMI.  Generalized: Well developed, in no acute distress   Neurological examination  Mentation: Alert. Follows all commands Intermittently. Nonverbal  Cranial nerve II-XII: Pupils were equal round reactive to light. Extraocular movements were full, visual field were full on confrontational test.  Uvula tongue midline. Head turning   normal and symmetric.tremor noted in both hands R>L Motor: The motor testing reveals 5 over 5 strength of all 4 extremities. Good symmetric motor tone is noted throughout.   Coordination: Cerebellar testing reveals good finger-nose-finger on the right. Unable to do on the left. Patient unable to complete heel to shin   Gait and station: patient is in a wheelchair.    DIAGNOSTIC DATA (LABS, IMAGING, TESTING) - I reviewed patient records, labs, notes, testing and imaging myself where available.  Lab Results  Component Value Date   WBC 6.4 06/26/2016   HGB 12.5 06/26/2016   HCT 37 06/26/2016   MCV 84.1 03/29/2016   PLT 299 06/26/2016      Component  Value Date/Time   NA 140 06/26/2016   K 4.3 06/26/2016   CL 105 03/29/2016 2100   CO2 25 03/29/2016 2100   GLUCOSE 116 (H) 03/29/2016 2100   BUN 18 06/26/2016   CREATININE 0.5 06/26/2016   CREATININE 0.46 03/29/2016 2100   CALCIUM 9.6 03/29/2016 2100   PROT 7.0 03/29/2016 2100   ALBUMIN 4.1 03/29/2016 2100   AST 11 (A) 06/26/2016   ALT 9 06/26/2016   ALKPHOS 63 06/26/2016   BILITOT 0.5 03/29/2016 2100   GFRNONAA >60 03/29/2016 2100   GFRAA >60 03/29/2016 2100   Lab Results  Component Value Date   CHOL 193 06/26/2016   HDL 50 06/26/2016   LDLCALC 126 06/26/2016   TRIG 87 06/26/2016   Lab Results  Component Value Date   HGBA1C 4.8 06/26/2016   Lab Results  Component Value Date   VITAMINB12 273 10/09/2014   Lab Results  Component Value Date   TSH 1.481 10/09/2014      ASSESSMENT AND PLAN 61 y.o. year old female  has a past medical history of Anxiety; Dementia; Hyperlipidemia; Hypertension; Mental retardation; Obesity; Secondary Parkinson disease (HCC) (05/04/2015); Seizures (HCC); and Vitamin D deficiency. here with:  1. Seizures 2. Mental retardation   The patient will continue on Zonegran, Keppra and phenobarbital. I will check a phenobarbital level today. She had basic blood work in October that was  unremarkable. The caregiver was advised that if she has any seizure events they should let us know. She will follow-up in 6 months or sooner if needed.     Butch Penny, MSN, NP-C 11/26/2016, 10:29 AM [facility] [address] [place], [place] [address] [phone]

## 2016-11-26 NOTE — Progress Notes (Signed)
I called [name]. Spoke to Darden RestaurantsSana Myers [phone]) who reports to me that the pt has not had any seizure activity.

## 2016-11-27 ENCOUNTER — Telehealth: Payer: Self-pay | Admitting: *Deleted

## 2016-11-27 LAB — PHENOBARBITAL LEVEL: Phenobarbital, Serum: 19 ug/mL (ref 15–40)

## 2016-11-27 NOTE — Telephone Encounter (Signed)
-----   Message from Butch PennyMegan Millikan, NP sent at 11/27/2016  7:52 AM EST ----- Lab work unremarkable. Please call patient. Thanks!

## 2016-11-27 NOTE — Telephone Encounter (Signed)
Called [facility] facility to relay results. They asked results be faxed to [phone], attention [name].  Received fax confirmation.

## 2017-01-09 LAB — HEPATIC FUNCTION PANEL
ALT: 10 U/L (ref 7–35)
AST: 11 U/L — AB (ref 13–35)
Alkaline Phosphatase: 79 U/L (ref 25–125)
Bilirubin, Total: 0.2 mg/dL

## 2017-01-09 LAB — BASIC METABOLIC PANEL
BUN: 16 mg/dL (ref 4–21)
CREATININE: 0.5 mg/dL (ref 0.5–1.1)
Glucose: 122 mg/dL
POTASSIUM: 4.5 mmol/L (ref 3.4–5.3)
Sodium: 142 mmol/L (ref 137–147)

## 2017-01-09 LAB — LIPID PANEL
CHOLESTEROL: 177 mg/dL (ref 0–200)
HDL: 51 mg/dL (ref 35–70)
LDL Cholesterol: 97 mg/dL
TRIGLYCERIDES: 145 mg/dL (ref 40–160)

## 2017-02-04 ENCOUNTER — Non-Acute Institutional Stay (SKILLED_NURSING_FACILITY): Payer: Medicare Other | Admitting: Internal Medicine

## 2017-02-04 ENCOUNTER — Encounter: Payer: Self-pay | Admitting: Internal Medicine

## 2017-02-04 DIAGNOSIS — E782 Mixed hyperlipidemia: Secondary | ICD-10-CM

## 2017-02-04 DIAGNOSIS — F2089 Other schizophrenia: Secondary | ICD-10-CM

## 2017-02-04 DIAGNOSIS — R600 Localized edema: Secondary | ICD-10-CM

## 2017-02-04 DIAGNOSIS — G2119 Other drug induced secondary parkinsonism: Secondary | ICD-10-CM | POA: Diagnosis not present

## 2017-02-04 DIAGNOSIS — R569 Unspecified convulsions: Secondary | ICD-10-CM | POA: Diagnosis not present

## 2017-02-04 DIAGNOSIS — F411 Generalized anxiety disorder: Secondary | ICD-10-CM | POA: Diagnosis not present

## 2017-02-04 DIAGNOSIS — I10 Essential (primary) hypertension: Secondary | ICD-10-CM | POA: Diagnosis not present

## 2017-02-04 NOTE — Progress Notes (Signed)
Patient ID: Christine Pena, female   DOB: November 25, 1955, 61 y.o.   MRN: [id number]    DATE: 02/04/2017  Location:    [name] Room Number: 114 A Place of Service: SNF (31)   Extended Emergency Contact Information Primary Emergency Contact: Brideau,[name] Rutherford [place] Home Phone: [phone] Mobile Phone: [phone] Relation: Brother Secondary Emergency Contact: Sun,Mike  [place] Home Phone: [phone] Relation: Brother  Advanced Directive information Does Patient Have a Medical Advance Directive?: Yes, Type of Advance Directive: Out of facility DNR (pink MOST or yellow form), Pre-existing out of facility DNR order (yellow form or pink MOST form): Yellow form placed in chart (order not valid for inpatient use), Does patient want to make changes to medical advance directive?: No - Patient declined  Chief Complaint  Patient presents with  . Medical Management of Chronic Issues    Routine Visit- [facility]    HPI:  61 yo female long term resident seen today for f/u. She has no concerns today. No f/c, CP, SOB, HA or dizziness. No nursing concerns. Appetite ok. Sleeps well. She is a poor historian due to psych d/o. Hx obtained from chart.  Seizures - stable on keppra 1500 mg twice daily; zonegran 100 mg twice daily; phenobarbital 129.6 mg nightly; prn ativan. phenobarb level is 17.3   Hypertension - BP stable on lisinopril  10 mg daily   Dyslipidemia - stable on lipitor 10 mg daily. LDL 97  Vit d deficiency - stable on vit d 50,000 weekly. Vit D 25OH level 32  Lower extremity edema - stable on demadex 10 mg twice daily   Schizophrenia with psychotic disorder - mood stable on seroquel 25 mg at 2 pm  and  50 mg nightly; navane 10 mg nightly. followed by psych services. She continues to receive benefits from this regimen; any adjustments could cause her psychological harm. She is able to socialize with others at this time.    Secondary parkinson's disease - stable on cogentin 1 mg twice daily   Anxiety - stable on klonopin 1 mg  daily and 0.5 mg daily as needed   Past Medical History:  Diagnosis Date  . Anxiety   . Dementia   . Hyperlipidemia   . Hypertension   . Mental retardation   . Obesity   . 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 Seizures (HCC)   . Vitamin D deficiency     History reviewed. No pertinent surgical history.  Patient Care Team: Kirt Boys, DO as PCP - General (Internal Medicine) Center, [name]Sk[facility] Facility)  Social History   Social History  . Marital status: Single    Spouse name: N/A  . Number of children: 1  . Years of education: N/A   Occupational History  . Not on file.   Social History Main Topics  . Smoking status: Never Smoker  . Smokeless tobacco: Never Used  . Alcohol use No  . Drug use: No  . Sexual activity: Not Currently    Birth control/ protection: Post-menopausal   Other Topics Concern  . Not on file   Social History Narrative   Patient drinks about 5 glasses of tea daily.     reports that she has never smoked. She has never used smokeless tobacco. She reports that she does not drink alcohol or use drugs.  Family History  Problem Relation Age of Onset  . Heart disease Mother    Family Status  Relation Status  . Mother Alive  . Father Deceased  . Brother Alive  . Brother Clinical biochemist History  Administered Date(s) Administered  . Influenza-Unspecified 05/26/2015, 07/19/2016  . PPD Test 09/30/2016  . Pneumococcal Conjugate-13 05/26/2015  . Pneumococcal Polysaccharide-23 05/26/2015    Allergies  Allergen Reactions  . Depakote [Valproic Acid] Other (See Comments)    altered mental status/ high ammonia  . Haldol [Haloperidol Lactate] Other (See Comments)    Unknown reaction.    Medications: Patient's Medications  New Prescriptions   No medications on file  Previous Medications    ACETAMINOPHEN (TYLENOL) 500 MG TABLET    Take 500 mg by mouth every 4 (four) hours as needed for mild pain or fever.    ATORVASTATIN (LIPITOR) 10 MG TABLET    Take 10 mg by mouth daily.   BENZTROPINE (COGENTIN) 1 MG TABLET    Take 1 mg by mouth 2 (two) times daily.   CARBOXYMETHYLCELLULOSE 1 % OPHTHALMIC SOLUTION    Apply 1 drop to eye 2 (two) times daily.   CHLORHEXIDINE (PERIDEX) 0.12 % SOLUTION    Use as directed 20 mLs in the mouth or throat 4 (four) times daily.    CLONAZEPAM (KLONOPIN) 1 MG TABLET    Take 1 mg by mouth daily.    HYDROCORTISONE (ANUSOL-HC) 2.5 % RECTAL CREAM    Place 1 application rectally 2 (two) times daily as needed for hemorrhoids or itching.   LEVETIRACETAM (KEPPRA) 750 MG TABLET    Take 2 tablets (1,500 mg total) by mouth 2 (two) times daily.   LISINOPRIL (PRINIVIL,ZESTRIL) 10 MG TABLET    Take 10 mg by mouth daily.    PHENOBARBITAL (LUMINAL) 64.8 MG TABLET    Take 2 tablets (129.6 mg total) by mouth at bedtime.   QUETIAPINE (SEROQUEL) 50 MG TABLET    Take 50 mg by mouth at bedtime.    THIOTHIXENE (NAVANE) 10 MG CAPSULE    Take 10 mg by mouth at bedtime.    TORSEMIDE (DEMADEX) 10 MG TABLET    Take 10 mg by mouth 2 (two) times daily.   VITAMIN D, ERGOCALCIFEROL, (DRISDOL) 50000 UNITS CAPS CAPSULE    Take 50,000 Units by mouth every 7 (seven) days. Takes on Saturdays.   VITAMIN E (VITAMIN E) 200 UNIT CAPSULE    Take 400 Units by mouth daily.    ZONISAMIDE (ZONEGRAN) 50 MG CAPSULE    Take 100 mg by mouth 2 (two) times daily.  Modified Medications   No medications on file  Discontinued Medications   CLONAZEPAM (KLONOPIN) 0.5 MG TABLET    Take 0.5 mg by mouth daily as needed.    LORAZEPAM IJ    Inject 1 mg as directed. Inject 1 mg IM every 15 min prn active seizures. Give 1 mg IM every 15 min x 3 PRN for active seizures.   QUETIAPINE (SEROQUEL) 25 MG TABLET    Take 25 mg by mouth daily. For psychosis take at 1400    Review of Systems  Unable to perform ROS:  Psychiatric disorder    Vitals:   02/04/17 1112  BP: 103/70  Pulse: 75  Resp: 20  Temp: 97.6 F (36.4 C)  TempSrc: Oral  SpO2: 97%  Weight: 219 lb 12.8 oz (99.7 kg)  Height: 5\' 5"  (1.651 m)   Body mass index is 36.58 kg/m.  Physical Exam  Constitutional: She appears well-developed and well-nourished.  Lying in bed in NAD   HENT:  Mouth/Throat:  Oropharynx is clear and moist. No oropharyngeal exudate.  MMM; no oral thrush  Eyes: Pupils are equal, round, and reactive to light. No scleral icterus.  Neck: Neck supple. Carotid bruit is not present. No tracheal deviation present.  Cardiovascular: Regular rhythm and intact distal pulses.  Bradycardia present.  Exam reveals no gallop.   Murmur (1/6 SEM) heard. Trace LE edema b/l. No calf TTP  Pulmonary/Chest: Effort normal and breath sounds normal. No stridor. No respiratory distress. She has no wheezes. She has no rales.  Abdominal: Soft. Bowel sounds are normal. She exhibits no distension and no mass. There is no hepatomegaly. There is no tenderness. There is no rebound and no guarding.  Musculoskeletal: She exhibits edema (right hand).  Lymphadenopathy:    She has no cervical adenopathy.  Neurological: She is alert. She has normal reflexes. She displays tremor (resting).  Skin: Skin is warm and dry. No rash noted.  Psychiatric: She has a normal mood and affect. Her behavior is normal. Her speech is slurred.     Labs reviewed: Nursing Home on 02/04/2017  Component Date Value Ref Range Status  . Glucose 01/09/2017 122  mg/dL Final  . BUN 16/10/960404/19/2018 16  4 - 21 mg/dL Final  . Creatinine 54/09/811904/19/2018 0.5  0.5 - 1.1 mg/dL Final  . Potassium 14/78/295604/19/2018 4.5  3.4 - 5.3 mmol/L Final  . Sodium 01/09/2017 142  137 - 147 mmol/L Final  . Triglycerides 01/09/2017 145  40 - 160 mg/dL Final  . Cholesterol 21/30/865704/19/2018 177  0 - 200 mg/dL Final  . HDL 84/69/629504/19/2018 51  35 - 70 mg/dL Final  . LDL Cholesterol 01/09/2017 97  mg/dL Final  . Alkaline  Phosphatase 01/09/2017 79  25 - 125 U/L Final  . ALT 01/09/2017 10  7 - 35 U/L Final  . AST 01/09/2017 11* 13 - 35 U/L Final  . Bilirubin, Total 01/09/2017 0.2  mg/dL Final  Abstract on 28/41/324403/19/2018  Component Date Value Ref Range Status  . Glucose 10/07/2016 114  mg/dL Final  . BUN 01/02/725301/15/2018 19  4 - 21 mg/dL Final  . Creatinine 66/44/034701/15/2018 0.5  0.5 - 1.1 mg/dL Final  . Potassium 42/59/563801/15/2018 4.2  3.4 - 5.3 mmol/L Final  . Sodium 10/07/2016 141  137 - 147 mmol/L Final  Office Visit on 11/26/2016  Component Date Value Ref Range Status  . Phenobarbital, Serum 11/26/2016 19  15 - 40 ug/mL Final                                   Detection Limit = 3    No results found.   Assessment/Plan   ICD-9-CM ICD-10-CM   1. Essential hypertension 401.9 I10   2. Other schizophrenia (HCC) 295.80 F20.89    with psychosis  3. Other drug-induced secondary parkinsonism (HCC) 332.1 G21.19    E980.5    4. Anxiety state 300.00 F41.1   5. Bilateral lower extremity edema 782.3 R60.0   6. Seizure (HCC) 780.39 R56.9   7. Mixed hyperlipidemia 272.2 E78.2     Cont current meds as ordered  Nutritional supplements as indicated  PT/OT/ST as indicated  Will follow  Shyanna Klingel S. Ancil Linseyarter, D. O., F. A. C. O. I.  [facility] [address] [place], [place] [address] [phone] Cell (Monday-Friday 8 AM - 5 PM) [phone] After 5 PM and follow prompts

## 2017-03-04 ENCOUNTER — Non-Acute Institutional Stay (SKILLED_NURSING_FACILITY): Payer: Medicare Other

## 2017-03-04 DIAGNOSIS — Z0000 Encounter for general adult medical examination without abnormal findings: Secondary | ICD-10-CM

## 2017-03-04 NOTE — Progress Notes (Signed)
Subjective:   Christine Pena is a 61 y.o. female who presents for Medicare Annual (Subsequent) preventive examination. [facility] residing [facility]; incapacitated and unable to answer questions appropriately      Objective:     Vitals: BP 140/70 (BP Location: Right Arm, Patient Position: Sitting)   Pulse 70   Temp 97.9 F (36.6 C) (Oral)   Ht 5\' 5"  (1.651 m)   Wt 220 lb (99.8 kg)   SpO2 97%   BMI 36.61 kg/m   Body mass index is 36.61 kg/m.   Tobacco History  Smoking Status  . Never Smoker  Smokeless Tobacco  . Never Used     Counseling given: Not Answered   Past Medical History:  Diagnosis Date  . Anxiety   . Dementia   . Hyperlipidemia   . Hypertension   . Mental retardation   . Obesity   . 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 Seizures (HCC)   . Vitamin D deficiency    History reviewed. No pertinent surgical history. Family History  Problem Relation Age of Onset  . Heart disease Mother    History  Sexual Activity  . Sexual activity: Not Currently  . Birth control/ protection: Post-menopausal    Outpatient Encounter Prescriptions as of 03/04/2017  Medication Sig  . acetaminophen (TYLENOL) 500 MG tablet Take 500 mg by mouth every 4 (four) hours as needed for mild pain or fever.   Marland Kitchen. atorvastatin (LIPITOR) 10 MG tablet Take 10 mg by mouth daily.  . benztropine (COGENTIN) 1 MG tablet Take 1 mg by mouth 2 (two) times daily.  . carboxymethylcellulose 1 % ophthalmic solution Apply 1 drop to eye 2 (two) times daily.  . chlorhexidine (PERIDEX) 0.12 % solution Use as directed 20 mLs in the mouth or throat 4 (four) times daily.   . clonazePAM (KLONOPIN) 1 MG tablet Take 1 mg by mouth daily.   . hydrocortisone (ANUSOL-HC) 2.5 % rectal cream Place 1 application rectally 2 (two) times daily as needed for hemorrhoids or itching.  . levETIRAcetam (KEPPRA) 750 MG tablet Take 2 tablets (1,500 mg total) by mouth 2 (two)  times daily.  Marland Kitchen. lisinopril (PRINIVIL,ZESTRIL) 10 MG tablet Take 10 mg by mouth daily.   Marland Kitchen. PHENobarbital (LUMINAL) 64.8 MG tablet Take 2 tablets (129.6 mg total) by mouth at bedtime.  Marland Kitchen. QUEtiapine (SEROQUEL) 50 MG tablet Take 50 mg by mouth at bedtime.   Marland Kitchen. thiothixene (NAVANE) 10 MG capsule Take 10 mg by mouth at bedtime.   . torsemide (DEMADEX) 10 MG tablet Take 10 mg by mouth 2 (two) times daily.  . Vitamin D, Ergocalciferol, (DRISDOL) 50000 UNITS CAPS capsule Take 50,000 Units by mouth every 7 (seven) days. Takes on Saturdays.  . vitamin E (VITAMIN E) 200 UNIT capsule Take 400 Units by mouth daily.   Marland Kitchen. zonisamide (ZONEGRAN) 50 MG capsule Take 100 mg by mouth 2 (two) times daily.   No facility-administered encounter medications on file as of 03/04/2017.     Activities of Daily Living In your present state of health, do you have any difficulty performing the following activities: 03/04/2017  Hearing? N  Vision? N  Difficulty concentrating or making decisions? Y  Walking or climbing stairs? Y  Dressing or bathing? Y  Doing errands, shopping? Y  Preparing Food and eating ? Y  Using the Toilet? Y  In the past six months, have you accidently leaked urine? Y  Do you have  problems with loss of bowel control? Y  Managing your Medications? Y  Managing your Finances? Y  Housekeeping or managing your Housekeeping? Y  Some recent data might be hidden    Patient Care Team: Kirt Boys, DO as PCP - General (Internal Medicine) Center, [name])    Assessment:     Exercise Activities and Dietary recommendations Current Exercise Habits: The patient does not participate in regular exercise at present, Exercise limited by: neurologic condition(s)  Goals    None     Fall Risk Fall Risk  03/04/2017 01/27/2017  Falls in the past year? Yes Yes  Number falls in past yr: 2 or more 1  Injury with Fall? No Yes  Risk Factor Category  - High Fall Risk  Risk  for fall due to : - History of fall(s);Medication side effect;Impaired balance/gait;Impaired mobility  Follow up - Falls evaluation completed   Depression Screen PHQ 2/9 Scores 03/04/2017  Exception Documentation Medical reason     Cognitive Function MMSE - Mini Mental State Exam 03/04/2017  Not completed: Unable to complete        Immunization History  Administered Date(s) Administered  . Influenza-Unspecified 05/26/2015, 07/19/2016  . PPD Test 09/30/2016  . Pneumococcal Conjugate-13 05/26/2015  . Pneumococcal Polysaccharide-23 05/26/2015   Screening Tests Health Maintenance  Topic Date Due  . Hepatitis C Screening  01/03/1956  . TETANUS/TDAP  07/14/1974  . INFLUENZA VACCINE  04/23/2017  . HIV Screening  Completed      Plan:    I have personally reviewed and addressed the Medicare Annual Wellness questionnaire and have noted the following in the patient's chart:  A. Medical and social history B. Use of alcohol, tobacco or illicit drugs  C. Current medications and supplements D. Functional ability and status E.  Nutritional status F.  Physical activity G. Advance directives H. List of other physicians I.  Hospitalizations, surgeries, and ER visits in previous 12 months J.  Vitals K. Screenings to include hearing, vision, cognitive, depression L. Referrals and appointments - none  In addition, I have reviewed and discussed with patient certain preventive protocols, quality metrics, and best practice recommendations. A written personalized care plan for preventive services as well as general preventive health recommendations were provided to patient.  See attached scanned questionnaire for additional information.   Signed,   Annetta Maw, RN Nurse Health Advisor   Quick Notes   Health Maintenance: Prevnar due     Abnormal Screen:  Unable to complete cognitive exam due to dementia     Patient Concerns: None     Nurse Concerns: None

## 2017-03-04 NOTE — Patient Instructions (Signed)
Christine Pena , Thank you for taking time to come for your Medicare Wellness Visit. I appreciate your ongoing commitment to your health goals. Please review the following plan we discussed and let me know if I can assist you in the future.   Screening recommendations/referrals: Colonoscopy long term pt Mammogram long term pt Bone Density up to date Recommended yearly ophthalmology/optometry visit for glaucoma screening and checkup Recommended yearly dental visit for hygiene and checkup  Vaccinations: Influenza vaccine due 07/19/2017 Pneumococcal vaccine 13 due Tdap vaccine not in records Shingles vaccine not in records  Advanced directives: DNR in chart  Conditions/risks identified: None  Next appointment: none upcoming  Preventive Care 40-64 Years, Female Preventive care refers to lifestyle choices and visits with your health care provider that can promote health and wellness. What does preventive care include?  A yearly physical exam. This is also called an annual well check.  Dental exams once or twice a year.  Routine eye exams. Ask your health care provider how often you should have your eyes checked.  Personal lifestyle choices, including:  Daily care of your teeth and gums.  Regular physical activity.  Eating a healthy diet.  Avoiding tobacco and drug use.  Limiting alcohol use.  Practicing safe sex.  Taking low-dose aspirin daily starting at age 36.  Taking vitamin and mineral supplements as recommended by your health care provider. What happens during an annual well check? The services and screenings done by your health care provider during your annual well check will depend on your age, overall health, lifestyle risk factors, and family history of disease. Counseling  Your health care provider may ask you questions about your:  Alcohol use.  Tobacco use.  Drug use.  Emotional well-being.  Home and relationship well-being.  Sexual  activity.  Eating habits.  Work and work Statistician.  Method of birth control.  Menstrual cycle.  Pregnancy history. Screening  You may have the following tests or measurements:  Height, weight, and BMI.  Blood pressure.  Lipid and cholesterol levels. These may be checked every 5 years, or more frequently if you are over 27 years old.  Skin check.  Lung cancer screening. You may have this screening every year starting at age 38 if you have a 30-pack-year history of smoking and currently smoke or have quit within the past 15 years.  Fecal occult blood test (FOBT) of the stool. You may have this test every year starting at age 46.  Flexible sigmoidoscopy or colonoscopy. You may have a sigmoidoscopy every 5 years or a colonoscopy every 10 years starting at age 27.  Hepatitis C blood test.  Hepatitis B blood test.  Sexually transmitted disease (STD) testing.  Diabetes screening. This is done by checking your blood sugar (glucose) after you have not eaten for a while (fasting). You may have this done every 1-3 years.  Mammogram. This may be done every 1-2 years. Talk to your health care provider about when you should start having regular mammograms. This may depend on whether you have a family history of breast cancer.  BRCA-related cancer screening. This may be done if you have a family history of breast, ovarian, tubal, or peritoneal cancers.  Pelvic exam and Pap test. This may be done every 3 years starting at age 39. Starting at age 7, this may be done every 5 years if you have a Pap test in combination with an HPV test.  Bone density scan. This is done to screen for osteoporosis.  You may have this scan if you are at high risk for osteoporosis. Discuss your test results, treatment options, and if necessary, the need for more tests with your health care provider. Vaccines  Your health care provider may recommend certain vaccines, such as:  Influenza vaccine. This is  recommended every year.  Tetanus, diphtheria, and acellular pertussis (Tdap, Td) vaccine. You may need a Td booster every 10 years.  Zoster vaccine. You may need this after age 85.  Pneumococcal 13-valent conjugate (PCV13) vaccine. You may need this if you have certain conditions and were not previously vaccinated.  Pneumococcal polysaccharide (PPSV23) vaccine. You may need one or two doses if you smoke cigarettes or if you have certain conditions. Talk to your health care provider about which screenings and vaccines you need and how often you need them. This information is not intended to replace advice given to you by your health care provider. Make sure you discuss any questions you have with your health care provider. Document Released: 10/06/2015 Document Revised: 05/29/2016 Document Reviewed: 07/11/2015 Elsevier Interactive Patient Education  2017 [place] Prevention in the Home Falls can cause injuries. They can happen to people of all ages. There are many things you can do to make your home safe and to help prevent falls. What can I do on the outside of my home?  Regularly fix the edges of walkways and driveways and fix any cracks.  Remove anything that might make you trip as you walk through a door, such as a raised step or threshold.  Trim any bushes or trees on the path to your home.  Use bright outdoor lighting.  Clear any walking paths of anything that might make someone trip, such as rocks or tools.  Regularly check to see if handrails are loose or broken. Make sure that both sides of any steps have handrails.  Any raised decks and porches should have guardrails on the edges.  Have any leaves, snow, or ice cleared regularly.  Use sand or salt on walking paths during winter.  Clean up any spills in your garage right away. This includes oil or grease spills. What can I do in the bathroom?  Use night lights.  Install grab bars by the toilet and in  the tub and shower. Do not use towel bars as grab bars.  Use non-skid mats or decals in the tub or shower.  If you need to sit down in the shower, use a plastic, non-slip stool.  Keep the floor dry. Clean up any water that spills on the floor as soon as it happens.  Remove soap buildup in the tub or shower regularly.  Attach bath mats securely with double-sided non-slip rug tape.  Do not have throw rugs and other things on the floor that can make you trip. What can I do in the bedroom?  Use night lights.  Make sure that you have a light by your bed that is easy to reach.  Do not use any sheets or blankets that are too big for your bed. They should not hang down onto the floor.  Have a firm chair that has side arms. You can use this for support while you get dressed.  Do not have throw rugs and other things on the floor that can make you trip. What can I do in the kitchen?  Clean up any spills right away.  Avoid walking on wet floors.  Keep items that you use a lot  in easy-to-reach places.  If you need to reach something above you, use a strong step stool that has a grab bar.  Keep electrical cords out of the way.  Do not use floor polish or wax that makes floors slippery. If you must use wax, use non-skid floor wax.  Do not have throw rugs and other things on the floor that can make you trip. What can I do with my stairs?  Do not leave any items on the stairs.  Make sure that there are handrails on both sides of the stairs and use them. Fix handrails that are broken or loose. Make sure that handrails are as long as the stairways.  Check any carpeting to make sure that it is firmly attached to the stairs. Fix any carpet that is loose or worn.  Avoid having throw rugs at the top or bottom of the stairs. If you do have throw rugs, attach them to the floor with carpet tape.  Make sure that you have a light switch at the top of the stairs and the bottom of the stairs. If  you do not have them, ask someone to add them for you. What else can I do to help prevent falls?  Wear shoes that:  Do not have high heels.  Have rubber bottoms.  Are comfortable and fit you well.  Are closed at the toe. Do not wear sandals.  If you use a stepladder:  Make sure that it is fully opened. Do not climb a closed stepladder.  Make sure that both sides of the stepladder are locked into place.  Ask someone to hold it for you, if possible.  Clearly mark and make sure that you can see:  Any grab bars or handrails.  First and last steps.  Where the edge of each step is.  Use tools that help you move around (mobility aids) if they are needed. These include:  Canes.  Walkers.  Scooters.  Crutches.  Turn on the lights when you go into a dark area. Replace any light bulbs as soon as they burn out.  Set up your furniture so you have a clear path. Avoid moving your furniture around.  If any of your floors are uneven, fix them.  If there are any pets around you, be aware of where they are.  Review your medicines with your doctor. Some medicines can make you feel dizzy. This can increase your chance of falling. Ask your doctor what other things that you can do to help prevent falls. This information is not intended to replace advice given to you by your health care provider. Make sure you discuss any questions you have with your health care provider. Document Released: 07/06/2009 Document Revised: 02/15/2016 Document Reviewed: 10/14/2014 Elsevier Interactive Patient Education  2017 [employer].

## 2017-04-08 IMAGING — CR DG CHEST 1V PORT
1 series · 1 of 1 positions shown · non-contrast
Comparison: Chest radiograph performed 05/14/2015

CLINICAL DATA: Acute onset of seizure.  Initial encounter.

EXAM:
PORTABLE CHEST 1 VIEW

[AP]
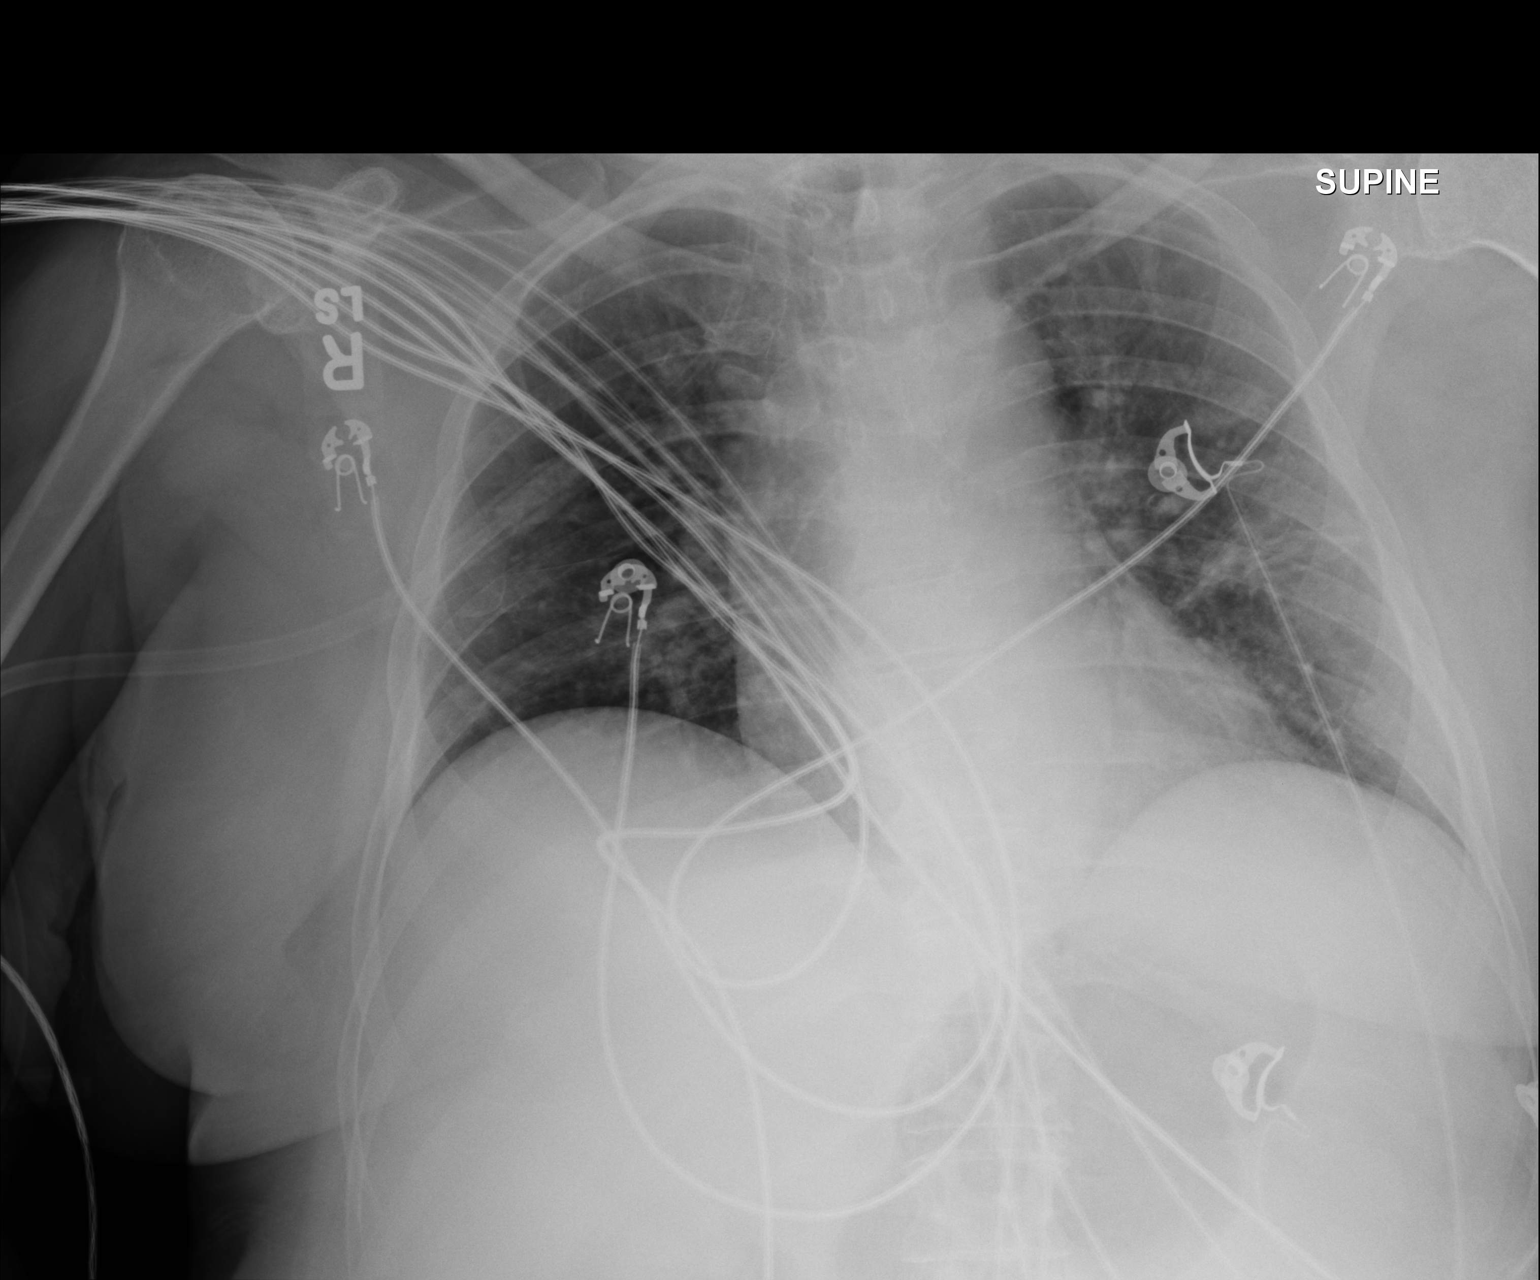

[1 of 1 positions shown; findings below may reference images not displayed]

FINDINGS: The lungs are mildly hypoexpanded. Mild left midlung opacity may
reflect atelectasis or possibly mild pneumonia. There is no evidence
of pleural effusion or pneumothorax.

The cardiomediastinal silhouette is borderline normal in size. No
acute osseous abnormalities are seen.
IMPRESSION: Mild left midlung opacity may reflect atelectasis or possibly mild
pneumonia, depending on the patient's symptoms. Lungs mildly
hypoexpanded.

## 2017-05-29 ENCOUNTER — Encounter: Payer: Self-pay | Admitting: Neurology

## 2017-05-29 ENCOUNTER — Ambulatory Visit (INDEPENDENT_AMBULATORY_CARE_PROVIDER_SITE_OTHER): Payer: Medicare Other | Admitting: Neurology

## 2017-05-29 VITALS — BP 121/78 | HR 77 | Ht 65.0 in

## 2017-05-29 DIAGNOSIS — R569 Unspecified convulsions: Secondary | ICD-10-CM | POA: Diagnosis not present

## 2017-05-29 DIAGNOSIS — G2119 Other drug induced secondary parkinsonism: Secondary | ICD-10-CM | POA: Diagnosis not present

## 2017-05-29 DIAGNOSIS — F79 Unspecified intellectual disabilities: Secondary | ICD-10-CM

## 2017-05-29 NOTE — Progress Notes (Signed)
Reason for visit: Seizures  Christine EagleDebra L Pena is an 61 y.o. female  History of present illness:  Christine Pena is a 61 year old female with a history of mental retardation and seizures. The patient has secondary parkinsonism, she has a very limited ability to ambulate. The patient has been on Keppra, phenobarbital, and Zonegran for her seizures. She was last seen 6 months ago and good seizure control was reported that time. The patient comes in with an assistant, but she does not know anything about the patient. The Zonegran is not listed [facility] the [facility] provided. The patient has been eating and drinking well. She does fall on occasion when she tries to walk independently. [facility].  Past Medical History:  Diagnosis Date  . Anxiety   . Dementia   . Hyperlipidemia   . Hypertension   . Mental retardation   . Obesity   . 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 Seizures (HCC)   . Vitamin D deficiency     History reviewed. No pertinent surgical history.  Family History  Problem Relation Age of Onset  . Heart disease Mother     Social history:  reports that she has never smoked. She has never used smokeless tobacco. She reports that she does not drink alcohol or use drugs.    Allergies  Allergen Reactions  . Depakote [Valproic Acid] Other (See Comments)    altered mental status/ high ammonia  . Haldol [Haloperidol Lactate] Other (See Comments)    Unknown reaction.    Medications:  Prior to Admission medications   Medication Sig Start Date End Date Taking? Authorizing Provider  atorvastatin (LIPITOR) 10 MG tablet Take 10 mg by mouth daily.   Yes [provider]  carboxymethylcellulose 1 % ophthalmic solution Apply 1 drop to eye 2 (two) times daily.   Yes [provider]  chlorhexidine (PERIDEX) 0.12 % solution Use as directed 20 mLs in the mouth or throat 4 (four) times daily.    Yes [provider]  clonazePAM (KLONOPIN) 1 MG tablet Take 1 mg by mouth daily.    Yes [provider]  levETIRAcetam (KEPPRA) 750 MG tablet Take 2 tablets (1,500 mg total) by mouth 2 (two) times daily. 03/19/16  Yes [name], Koal Eslinger K, MD  lisinopril (PRINIVIL,ZESTRIL) 10 MG tablet Take 10 mg by mouth daily.    Yes [provider]  magnesium hydroxide (MILK OF MAGNESIA) 400 MG/5ML suspension Take 30 mLs by mouth daily as needed for mild constipation.   Yes [provider]  PHENobarbital (LUMINAL) 64.8 MG tablet Take 2 tablets (129.6 mg total) by mouth at bedtime. 05/19/15  Yes Montez Moritaarter, [name], DO  QUEtiapine (SEROQUEL) 50 MG tablet Take 50 mg by mouth at bedtime.    Yes [provider]  thiothixene (NAVANE) 10 MG capsule Take 10 mg by mouth at bedtime.    Yes [provider]  torsemide (DEMADEX) 10 MG tablet Take 10 mg by mouth 2 (two) times daily.   Yes [provider]  Vitamin D, Ergocalciferol, (DRISDOL) 50000 UNITS CAPS capsule Take 50,000 Units by mouth every 7 (seven) days. Takes on Saturdays.   Yes [provider]  acetaminophen (TYLENOL) 500 MG tablet Take 500 mg by mouth every 4 (four) hours as needed for mild pain or fever.     [provider]  benztropine (COGENTIN) 1 MG tablet Take 1 mg by mouth 2 (two) times daily.  [provider]  hydrocortisone (ANUSOL-HC) 2.5 % rectal cream Place 1 application rectally 2 (two) times daily as needed for hemorrhoids or itching. 11/13/15   Sharee Holster, NP  vitamin E (VITAMIN E) 200 UNIT capsule Take 400 Units by mouth daily.     [provider]  zonisamide (ZONEGRAN) 50 MG capsule Take 100 mg by mouth 2 (two) times daily.    [provider]    ROS:  Out of a complete 14 system review of symptoms, the patient complains only of the following symptoms, and all other reviewed systems are negative.  Seizures Gait disturbance Mental retardation  Blood pressure  121/78, pulse 77, height  (1.651 m), SpO2 96 %.  Physical Exam  General: The patient is alert and cooperative at the time of the examination. The patient is moderately obese.  Skin: No significant peripheral edema is noted.   Neurologic Exam  Mental status: The patient is alert and will cooperate the time of examination, the patient is minimally verbal.   Cranial nerves: Facial symmetry is present. Speech is normal, no aphasia or dysarthria is noted. Extraocular movements are full. Visual fields are full to threat.  Motor: The patient has good strength in all 4 extremities.  Sensory examination: Soft touch sensation is symmetric on the face, arms, and legs.  Coordination: The patient is unable to follow directions for cerebellar testing.  Gait and station: The patient will stand with assistance, she is able to take a few steps with assistance.  Reflexes: Deep tendon reflexes are symmetric.   Assessment/Plan:  1. Seizure disorder  2. Gait disorder  3. Secondary parkinsonism  4. Mental retardation  The patient apparently has done well with her seizure control, I did contact her nurse. She has not had any seizures at least since October 2017. The patient is still on Zonegran taking 100 mg twice daily. The patient will remain on these medications, she will follow-up in 6 months.   Marlan Palau MD 05/29/2017 10:09 AM  [facility] [address] [place], [place] [address]  Phone [phone] Fax [phone]

## 2017-11-26 ENCOUNTER — Ambulatory Visit (INDEPENDENT_AMBULATORY_CARE_PROVIDER_SITE_OTHER): Payer: Medicare Other | Admitting: Adult Health

## 2017-11-26 ENCOUNTER — Encounter (INDEPENDENT_AMBULATORY_CARE_PROVIDER_SITE_OTHER): Payer: Self-pay

## 2017-11-26 ENCOUNTER — Encounter: Payer: Self-pay | Admitting: Adult Health

## 2017-11-26 DIAGNOSIS — F79 Unspecified intellectual disabilities: Secondary | ICD-10-CM

## 2017-11-26 DIAGNOSIS — Z5181 Encounter for therapeutic drug level monitoring: Secondary | ICD-10-CM | POA: Diagnosis not present

## 2017-11-26 DIAGNOSIS — R569 Unspecified convulsions: Secondary | ICD-10-CM | POA: Diagnosis not present

## 2017-11-26 NOTE — Progress Notes (Signed)
I have read the note, and I agree with the clinical assessment and plan.  [name] Willis   

## 2017-11-26 NOTE — Patient Instructions (Signed)
Your Plan:  Continue Zongran, keppra, and Phenobarbital  Blood work today If your symptoms worsen or you develop new symptoms please let us know.       Thank you for [facility]. I hope we have been able to provide you high quality care today.  You may receive a patient satisfaction survey over the next few weeks. We would appreciate your feedback and comments so that we may continue to improve ourselves and the health of our patients.

## 2017-11-26 NOTE — Progress Notes (Signed)
PATIENT: Christine Pena DOB: 1956-01-04  REASON FOR VISIT: follow up HISTORY FROM: patient  HISTORY OF PRESENT ILLNESS: Today 11/26/17 Christine Pena is a 62 year old female with a history of mental retardation and seizures.  She returns today for follow-up.  She is here with her caregiver.  She continues to live in a skilled nursing facility.  She remains on Keppra, phenobarbital and Zonegran for seizures.  The caregiver denies any seizure events.  Reports that the patient has been doing well.  She reports that she does sometimes get agitated easily.  Otherwise patient is doing well.  She returns today for evaluation.  HISTORY [name] is a 62 year old female with a history of mental retardation and seizures. The patient has secondary parkinsonism, she has a very limited ability to ambulate. The patient has been on Keppra, phenobarbital, and Zonegran for her seizures. She was last seen 6 months ago and good seizure control was reported that time. The patient comes in with an assistant, but she does not know anything about the patient. The Zonegran is not listed on the [facility] provided. The patient has been eating and drinking well. She does fall on occasion when she tries to walk independently. She lives at the [facility].   REVIEW OF SYSTEMS: Out of a complete 14 system review of symptoms, the patient complains only of the following symptoms, and all other reviewed systems are negative.  ALLERGIES: Allergies  Allergen Reactions  . Depakote [Valproic Acid] Other (See Comments)    altered mental status/ high ammonia  . Haldol [Haloperidol Lactate] Other (See Comments)    Unknown reaction.    HOME MEDICATIONS: Outpatient Medications Prior to Visit  Medication Sig Dispense Refill  . acetaminophen (TYLENOL) 500 MG tablet Take 500 mg by mouth every 4 (four) hours as needed for mild pain or fever.     Marland Kitchen atorvastatin (LIPITOR) 10 MG tablet Take 10 mg by  mouth daily.    . benztropine (COGENTIN) 1 MG tablet Take 1 mg by mouth 2 (two) times daily.    . carboxymethylcellulose 1 % ophthalmic solution Apply 1 drop to eye 2 (two) times daily.    . chlorhexidine (PERIDEX) 0.12 % solution Use as directed 20 mLs in the mouth or throat 4 (four) times daily.     . clonazePAM (KLONOPIN) 1 MG tablet Take 1 mg by mouth daily.     . diphenhydrAMINE (BENADRYL) 25 MG tablet Take 25 mg by mouth every 6 (six) hours as needed.    . hydrocortisone (ANUSOL-HC) 2.5 % rectal cream Place 1 application rectally 2 (two) times daily as needed for hemorrhoids or itching. 30 g prn  . levETIRAcetam (KEPPRA) 750 MG tablet Take 2 tablets (1,500 mg total) by mouth 2 (two) times daily.    Marland Kitchen lisinopril (PRINIVIL,ZESTRIL) 10 MG tablet Take 10 mg by mouth daily.     . magnesium hydroxide (MILK OF MAGNESIA) 400 MG/5ML suspension Take 30 mLs by mouth daily as needed for mild constipation.    Marland Kitchen omeprazole (PRILOSEC) 20 MG capsule Take 20 mg by mouth daily.    Marland Kitchen PHENobarbital (LUMINAL) 64.8 MG tablet Take 2 tablets (129.6 mg total) by mouth at bedtime. 60 tablet 0  . QUEtiapine (SEROQUEL) 50 MG tablet Take 50 mg by mouth at bedtime.     . torsemide (DEMADEX) 10 MG tablet Take 10 mg by mouth 2 (two) times daily.    . Vitamin D, Ergocalciferol, (DRISDOL) 50000 UNITS CAPS  capsule Take 50,000 Units by mouth every 7 (seven) days. Takes on Saturdays.    . vitamin E (VITAMIN E) 200 UNIT capsule Take 400 Units by mouth daily.     Marland Kitchen zonisamide (ZONEGRAN) 50 MG capsule Take 100 mg by mouth 2 (two) times daily.    Marland Kitchen thiothixene (NAVANE) 10 MG capsule Take 10 mg by mouth at bedtime.      No facility-administered medications prior to visit.     PAST MEDICAL HISTORY: Past Medical History:  Diagnosis Date  . Anxiety   . Dementia   . Hyperlipidemia   . Hypertension   . Mental retardation   . Obesity   . 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 Seizures (HCC)   . Vitamin D deficiency       PAST SURGICAL HISTORY: No past surgical history on file.  FAMILY HISTORY: Family History  Problem Relation Age of Onset  . Heart disease Mother     SOCIAL HISTORY: Social History   Socioeconomic History  . Marital status: Single    Spouse name: Not on file  . Number of children: 1  . Years of education: Not on file  . Highest education level: Not on file  Social Needs  . Financial resource strain: Not on file  . Food insecurity - worry: Not on file  . Food insecurity - inability: Not on file  . Transportation needs - medical: Not on file  . Transportation needs - non-medical: Not on file  Occupational History  . Not on file  Tobacco Use  . Smoking status: Never Smoker  . Smokeless tobacco: Never Used  Substance and Sexual Activity  . Alcohol use: No  . Drug use: No  . Sexual activity: Not Currently    Birth control/protection: Post-menopausal  Other Topics Concern  . Not on file  Social History Narrative   Patient drinks about 5 glasses of tea daily.      PHYSICAL EXAM  Vitals:   There is no height or weight on file to calculate BMI.  Generalized: Well developed, in no acute distress   Neurological examination  Mentation: Alert . Follows all commands intermittently.  Speech is minimal Cranial nerve II-XII: Pupils were equal round reactive to light. Extraocular movements were full, visual field were full on confrontational test.  Uvula tongue midline. Head turning and shoulder shrug  were normal and symmetric. Motor: The motor testing reveals 5 over 5 strength of all 4 extremities. Good symmetric motor tone is noted throughout.  Sensory: Sensory testing is intact to soft touch on all 4 extremities. No evidence of extinction is noted.  Coordination: Unable to test Gait and station: Patient is in a wheelchair Reflexes: Deep tendon reflexes are symmetric and normal bilaterally.   DIAGNOSTIC DATA (LABS, IMAGING, TESTING) - I reviewed patient records, labs,  notes, testing and imaging myself where available.  Lab Results  Component Value Date   WBC 6.4 06/26/2016   HGB 12.5 06/26/2016   HCT 37 06/26/2016   MCV 84.1 03/29/2016   PLT 299 06/26/2016      Component Value Date/Time   NA 142 01/09/2017   K 4.5 01/09/2017   CL 105 03/29/2016 2100   CO2 25 03/29/2016 2100   GLUCOSE 116 (H) 03/29/2016 2100   BUN 16 01/09/2017   CREATININE 0.5 01/09/2017   CREATININE 0.46 03/29/2016 2100   CALCIUM 9.6 03/29/2016 2100   PROT 7.0 03/29/2016 2100   ALBUMIN 4.1 03/29/2016 2100   AST 11 (A)  01/09/2017   ALT 10 01/09/2017   ALKPHOS 79 01/09/2017   BILITOT 0.5 03/29/2016 2100   GFRNONAA >60 03/29/2016 2100   GFRAA >60 03/29/2016 2100   Lab Results  Component Value Date   CHOL 177 01/09/2017   HDL 51 01/09/2017   LDLCALC 97 01/09/2017   TRIG 145 01/09/2017   Lab Results  Component Value Date   HGBA1C 4.8 06/26/2016   Lab Results  Component Value Date   VITAMINB12 273 10/09/2014   Lab Results  Component Value Date   TSH 1.481 10/09/2014      ASSESSMENT AND PLAN 62 y.o. year old female  has a past medical history of Anxiety, Dementia, Hyperlipidemia, Hypertension, Mental retardation, Obesity, Secondary Parkinson disease (HCC) (05/04/2015), Seizures (HCC), and Vitamin D deficiency. here with:  1.  Seizures 2.  Mental retardation  Overall the patient has remained stable.  She will continue on Keppra, phenobarbital and Zonegran.  We will attempt to get blood work today.  Advised that if she has any seizure event she should let us know.  She will follow-up in 6 months or sooner as needed.      Butch PennyMegan Jonica Bickhart, MSN, NP-C 11/26/2017, 10:45 AM [facility] [address], [address] [place], [place] [address] [phone]

## 2017-11-27 LAB — CBC WITH DIFFERENTIAL/PLATELET
BASOS: 0 %
Basophils Absolute: 0 10*3/uL (ref 0.0–0.2)
EOS (ABSOLUTE): 0 10*3/uL (ref 0.0–0.4)
EOS: 0 %
HEMATOCRIT: 34.8 % (ref 34.0–46.6)
HEMOGLOBIN: 11.9 g/dL (ref 11.1–15.9)
Immature Grans (Abs): 0 10*3/uL (ref 0.0–0.1)
Immature Granulocytes: 0 %
LYMPHS ABS: 1.8 10*3/uL (ref 0.7–3.1)
Lymphs: 30 %
MCH: 28.9 pg (ref 26.6–33.0)
MCHC: 34.2 g/dL (ref 31.5–35.7)
MCV: 85 fL (ref 79–97)
Monocytes Absolute: 0.4 10*3/uL (ref 0.1–0.9)
Monocytes: 6 %
Neutrophils Absolute: 4 10*3/uL (ref 1.4–7.0)
Neutrophils: 64 %
Platelets: 319 10*3/uL (ref 150–379)
RBC: 4.12 x10E6/uL (ref 3.77–5.28)
RDW: 13.8 % (ref 12.3–15.4)
WBC: 6.2 10*3/uL (ref 3.4–10.8)

## 2017-11-27 LAB — COMPREHENSIVE METABOLIC PANEL
ALBUMIN: 3.9 g/dL (ref 3.6–4.8)
ALT: 15 IU/L (ref 0–32)
AST: 13 IU/L (ref 0–40)
Albumin/Globulin Ratio: 1.4 (ref 1.2–2.2)
Alkaline Phosphatase: 78 IU/L (ref 39–117)
BUN / CREAT RATIO: 31 — AB (ref 12–28)
BUN: 16 mg/dL (ref 8–27)
Bilirubin Total: <0.2 mg/dL (ref 0.0–1.2)
CALCIUM: 9.5 mg/dL (ref 8.7–10.3)
CO2: 23 mmol/L (ref 20–29)
CREATININE: 0.52 mg/dL — AB (ref 0.57–1.00)
Chloride: 107 mmol/L — ABNORMAL HIGH (ref 96–106)
GFR calc Af Amer: 118 mL/min/{1.73_m2} (ref >59)
GFR, EST NON AFRICAN AMERICAN: 103 mL/min/{1.73_m2} (ref >59)
GLUCOSE: 104 mg/dL — AB (ref 65–99)
Globulin, Total: 2.7 g/dL (ref 1.5–4.5)
Potassium: 4.4 mmol/L (ref 3.5–5.2)
Sodium: 144 mmol/L (ref 134–144)
TOTAL PROTEIN: 6.6 g/dL (ref 6.0–8.5)

## 2017-11-27 LAB — PHENOBARBITAL LEVEL: PHENOBARBITAL, SERUM: 21 ug/mL (ref 15–40)

## 2017-11-27 LAB — ZONISAMIDE LEVEL: Zonisamide: 5.1 ug/mL — ABNORMAL LOW (ref 10.0–40.0)

## 2017-12-01 ENCOUNTER — Telehealth: Payer: Self-pay | Admitting: *Deleted

## 2017-12-01 NOTE — Telephone Encounter (Signed)
Pt is residing at  [facility] [place], [place]  [phone]  Fax [phone]  ([facility]  Faxed lab results to pt residence for nursing staff (received confirmation).

## 2017-12-01 NOTE — Telephone Encounter (Signed)
-----   Message from Butch PennyMegan Millikan, NP sent at 11/27/2017  3:29 PM EST ----- Blood work is relatively unremarkable.  Please call patient with results.

## 2018-05-28 ENCOUNTER — Other Ambulatory Visit: Payer: Self-pay | Admitting: Adult Health Nurse Practitioner

## 2018-05-28 DIAGNOSIS — Z1231 Encounter for screening mammogram for malignant neoplasm of breast: Secondary | ICD-10-CM

## 2018-06-08 ENCOUNTER — Ambulatory Visit: Payer: Medicare Other | Admitting: Adult Health

## 2018-06-08 ENCOUNTER — Telehealth: Payer: Self-pay | Admitting: *Deleted

## 2018-06-08 NOTE — Telephone Encounter (Signed)
Phone staff received call today and cancelled patient's FU today. They stated they did not have any transportation for her.

## 2018-06-09 ENCOUNTER — Encounter: Payer: Self-pay | Admitting: Adult Health

## 2018-06-29 ENCOUNTER — Ambulatory Visit
Admission: RE | Admit: 2018-06-29 | Discharge: 2018-06-29 | Disposition: A | Discharge status: Home / Self Care | Payer: Medicare Other | Source: Ambulatory Visit | Attending: Adult Health Nurse Practitioner | Admitting: Adult Health Nurse Practitioner

## 2018-06-29 DIAGNOSIS — Z1231 Encounter for screening mammogram for malignant neoplasm of breast: Secondary | ICD-10-CM

## 2018-10-22 ENCOUNTER — Emergency Department (HOSPITAL_COMMUNITY)
Admission: EM | Admit: 2018-10-22 | Discharge: 2018-10-22 | Disposition: A | Discharge status: Home / Self Care | Payer: Medicare Other | Attending: Emergency Medicine | Admitting: Emergency Medicine

## 2018-10-22 DIAGNOSIS — Z79899 Other long term (current) drug therapy: Secondary | ICD-10-CM | POA: Diagnosis not present

## 2018-10-22 DIAGNOSIS — I10 Essential (primary) hypertension: Secondary | ICD-10-CM | POA: Insufficient documentation

## 2018-10-22 DIAGNOSIS — F0390 Unspecified dementia without behavioral disturbance: Secondary | ICD-10-CM | POA: Insufficient documentation

## 2018-10-22 DIAGNOSIS — R569 Unspecified convulsions: Secondary | ICD-10-CM

## 2018-10-22 DIAGNOSIS — G40909 Epilepsy, unspecified, not intractable, without status epilepticus: Secondary | ICD-10-CM | POA: Diagnosis present

## 2018-10-22 LAB — CBC WITH DIFFERENTIAL/PLATELET
Abs Immature Granulocytes: 0.04 10*3/uL (ref 0.00–0.07)
Basophils Absolute: 0 10*3/uL (ref 0.0–0.1)
Basophils Relative: 0 %
EOS ABS: 0 10*3/uL (ref 0.0–0.5)
EOS PCT: 0 %
HCT: 37.5 % (ref 36.0–46.0)
HEMOGLOBIN: 11.8 g/dL — AB (ref 12.0–15.0)
Immature Granulocytes: 0 %
Lymphocytes Relative: 13 %
Lymphs Abs: 1.4 10*3/uL (ref 0.7–4.0)
MCH: 27.5 pg (ref 26.0–34.0)
MCHC: 31.5 g/dL (ref 30.0–36.0)
MCV: 87.4 fL (ref 80.0–100.0)
MONO ABS: 0.7 10*3/uL (ref 0.1–1.0)
Monocytes Relative: 6 %
NRBC: 0 % (ref 0.0–0.2)
Neutro Abs: 8.8 10*3/uL — ABNORMAL HIGH (ref 1.7–7.7)
Neutrophils Relative %: 81 %
Platelets: 356 10*3/uL (ref 150–400)
RBC: 4.29 MIL/uL (ref 3.87–5.11)
RDW: 12.4 % (ref 11.5–15.5)
WBC: 11 10*3/uL — ABNORMAL HIGH (ref 4.0–10.5)

## 2018-10-22 LAB — URINALYSIS, ROUTINE W REFLEX MICROSCOPIC
Bilirubin Urine: NEGATIVE
Glucose, UA: 50 mg/dL — AB
Ketones, ur: NEGATIVE mg/dL
Leukocytes, UA: NEGATIVE
Nitrite: NEGATIVE
Specific Gravity, Urine: 1.02 (ref 1.005–1.030)
pH: 8 (ref 5.0–8.0)

## 2018-10-22 LAB — BASIC METABOLIC PANEL
Anion gap: 10 (ref 5–15)
BUN: 12 mg/dL (ref 8–23)
CALCIUM: 8.9 mg/dL (ref 8.9–10.3)
CO2: 23 mmol/L (ref 22–32)
CREATININE: 0.76 mg/dL (ref 0.44–1.00)
Chloride: 104 mmol/L (ref 98–111)
GFR calc Af Amer: >60 mL/min (ref >60)
GFR calc non Af Amer: >60 mL/min (ref >60)
Glucose, Bld: 153 mg/dL — ABNORMAL HIGH (ref 70–99)
Potassium: 3.8 mmol/L (ref 3.5–5.1)
Sodium: 137 mmol/L (ref 135–145)

## 2018-10-22 LAB — PHENOBARBITAL LEVEL: Phenobarbital: 11 ug/mL — ABNORMAL LOW (ref 15.0–30.0)

## 2018-10-22 MED ORDER — LORAZEPAM 2 MG/ML IJ SOLN
INTRAMUSCULAR | Status: AC
  Filled 2018-10-22: qty 1

## 2018-10-22 MED ORDER — LORAZEPAM 2 MG/ML IJ SOLN
2.0000 mg | Freq: Once | INTRAMUSCULAR | Status: AC
  Administered 2018-10-22: 2 mg via INTRAMUSCULAR

## 2018-10-22 MED ORDER — LEVETIRACETAM 500 MG PO TABS
1500.0000 mg | ORAL_TABLET | Freq: Once | ORAL | Status: AC
  Administered 2018-10-22: 1500 mg via ORAL
  Filled 2018-10-22: qty 3

## 2018-10-22 MED ORDER — PHENOBARBITAL 64.8 MG PO TABS
64.8000 mg | ORAL_TABLET | Freq: Once | ORAL | Status: AC
  Administered 2018-10-22: 64.8 mg via ORAL
  Filled 2018-10-22: qty 1

## 2018-10-22 NOTE — ED Triage Notes (Signed)
BIB EMS from [facility]. Had two seizure tonight within of each other. First lasted about , second about . Pt remains disoriented x4 upon arrival in ED. Baseline is CAO x4. Hx of seizures with keppra for control. V/S WNL. Facility staff reported some blood in her mouth after first seizure, but nothing noted by EMS at this time. DNR with pt.

## 2018-10-22 NOTE — ED Provider Notes (Addendum)
[facility] Provider Note   CSN: [id number] Arrival date & time: 10/22/18  [id number]     History   Chief Complaint Chief Complaint - seizure   Level 5 caveat due to altered mental status HPI Christine EagleDebra L Pena is a 63 y.o. female.  The history is provided by the EMS personnel.  Seizures  Patient cannot provide any history. Per EMS, patient was brought in from a health care facility for 2 seizures within 10 minutes tonight first 1 lasted 5 minutes a second lasted 3 minutes.  Patient was postictally confused.  Patient has a history of seizures and is on multiple medications. No Other details are known at this time.  Past Medical History:  Diagnosis Date  . Anxiety   . Dementia (HCC)   . Hyperlipidemia   . Hypertension   . Mental retardation   . Obesity   . 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 Seizures (HCC)   . Vitamin D deficiency     Patient Active Problem List   Diagnosis Date Noted  . Schizophrenia spectrum disorder with psychotic disorder type not yet determined (HCC) 11/13/2015  . Vitamin D deficiency 07/31/2015  . Bilateral lower extremity edema 07/31/2015  . Anxiety state 07/31/2015  . Hyperlipidemia 05/15/2015  . 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 Hypokalemia 10/10/2014  . Essential hypertension 10/08/2014  . Morbid obesity (HCC) 05/05/2014  . Seizure (HCC) 05/03/2014  . Mental retardation 01/06/2014    No past surgical history on file.   OB History   No obstetric history on file.      Home Medications    Prior to Admission medications   Medication Sig Start Date End Date Taking? Authorizing Provider  acetaminophen (TYLENOL) 500 MG tablet Take 500 mg by mouth every 4 (four) hours as needed for mild pain or fever.     [provider]  atorvastatin (LIPITOR) 10 MG tablet Take 10 mg by mouth daily.    [provider]  benztropine (COGENTIN) 1 MG tablet Take 1 mg by mouth 2  (two) times daily.    [provider]  carboxymethylcellulose 1 % ophthalmic solution Apply 1 drop to eye 2 (two) times daily.    [provider]  chlorhexidine (PERIDEX) 0.12 % solution Use as directed 20 mLs in the mouth or throat 4 (four) times daily.     [provider]  clonazePAM (KLONOPIN) 1 MG tablet Take 1 mg by mouth daily.     [provider]  diphenhydrAMINE (BENADRYL) 25 MG tablet Take 25 mg by mouth every 6 (six) hours as needed.    [provider]  hydrocortisone (ANUSOL-HC) 2.5 % rectal cream Place 1 application rectally 2 (two) times daily as needed for hemorrhoids or itching. 11/13/15   [name], [name], NP  levETIRAcetam (KEPPRA) 750 MG tablet Take 2 tablets (1,500 mg total) by mouth 2 (two) times daily. 03/19/16   [name], [name], MD  lisinopril (PRINIVIL,ZESTRIL) 10 MG tablet Take 10 mg by mouth daily.     [provider]  magnesium hydroxide (MILK OF MAGNESIA) 400 MG/5ML suspension Take 30 mLs by mouth daily as needed for mild constipation.    [provider]  omeprazole (PRILOSEC) 20 MG capsule Take 20 mg by mouth daily.    [provider]  PHENobarbital (LUMINAL) 64.8 MG tablet Take 2 tablets (129.6 mg total) by mouth at bedtime. 05/19/15   [name], [name], DO  QUEtiapine (SEROQUEL) 50 MG tablet  Take 50 mg by mouth at bedtime.     [provider]  thiothixene (NAVANE) 10 MG capsule Take 10 mg by mouth at bedtime.     [provider]  torsemide (DEMADEX) 10 MG tablet Take 10 mg by mouth 2 (two) times daily.    [provider]  Vitamin D, Ergocalciferol, (DRISDOL) 50000 UNITS CAPS capsule Take 50,000 Units by mouth every 7 (seven) days. Takes on Saturdays.    [provider]  vitamin E (VITAMIN E) 200 UNIT capsule Take 400 Units by mouth daily.     [provider]  zonisamide (ZONEGRAN) 50 MG capsule Take 100 mg by mouth 2 (two) times daily.    [provider]    Family History Family History  Problem Relation Age of Onset  . Heart disease Mother     Social History Social History   Tobacco Use  . Smoking status: Never Smoker  . Smokeless tobacco: Never Used  Substance Use Topics  . Alcohol use: No  . Drug use: No     Allergies   Depakote [valproic acid] and Haldol [haloperidol lactate]   Review of Systems Review of Systems  Unable to perform ROS: Mental status change  Neurological: Positive for seizures.     Physical Exam Updated Vital Signs BP (!) 143/74 (BP Location: Right Arm)   Pulse (!) 102   Temp 98.3 F (36.8 C) (Oral)   Resp (!) 29   SpO2 95%   Physical Exam CONSTITUTIONAL: Disheveled, appears confused HEAD: Normocephalic/atraumatic, no signs of trauma EYES: EOMI/PERRL ENMT: Mucous membranes moist NECK: supple no meningeal signs SPINE/BACK:entire spine nontender CV: S1/S2 noted, no murmurs/rubs/gallops noted LUNGS: Lungs are clear to auscultation bilaterally, no apparent distress ABDOMEN: soft, nontender, no rebound or guarding, bowel sounds noted throughout abdomen GU:no cva tenderness NEURO: Pt is awake/alert moves all extremitiesx4.  No facial droop.   Patient is awake and alert but is crying out intermittently yelling help.  She follows commands.  She is moving all extremities x4 EXTREMITIES: pulses normal/equal, full ROM, no deformities or tenderness SKIN: warm, color normal PSYCH: Unable to assess  ED Treatments / Results  Labs (all labs ordered are listed, but only abnormal results are displayed) Labs Reviewed  BASIC METABOLIC PANEL - Abnormal; Notable for the following components:      Result Value   Glucose, Bld 153 (*)    All other components within normal limits  CBC WITH DIFFERENTIAL/PLATELET - Abnormal; Notable for the following components:   WBC 11.0 (*)    Hemoglobin 11.8 (*)    Neutro Abs 8.8 (*)    All other components within normal limits  PHENOBARBITAL LEVEL - Abnormal;  Notable for the following components:   Phenobarbital 11.0 (*)    All other components within normal limits  URINALYSIS, ROUTINE W REFLEX MICROSCOPIC - Abnormal; Notable for the following components:   APPearance CLOUDY (*)    Glucose, UA 50 (*)    Hgb urine dipstick SMALL (*)    Protein, ur >=300 (*)    Bacteria, UA RARE (*)    All other components within normal limits    EKG EKG Interpretation  Date/Time:  Thursday October 22 2018 03:51:33 EST Ventricular Rate:  109 PR Interval:    QRS Duration: 107 QT Interval:  335 QTC Calculation: 452 R Axis:   -147 Text Interpretation:  Sinus rhythm Probable right ventricular hypertrophy Interpretation limited secondary to artifact Confirmed by Zadie Rhine ([id number]) on 10/22/2018 4:08:53 AM  Radiology No results found.  Procedures Procedures  CRITICAL CARE Performed by: Joya Gaskinsonald W Rashunda Passon Total critical care time: 30 minutes Critical care time was exclusive of separately billable procedures and treating other patients. Critical care was necessary to treat or prevent imminent or life-threatening deterioration. Critical care was time spent personally by me on the following activities: development of treatment plan with patient and/or surrogate as well as nursing, discussions with consultants, evaluation of patient's response to treatment, examination of patient, obtaining history from patient or surrogate, ordering and performing treatments and interventions, ordering and review of laboratory studies, ordering and review of radiographic studies, pulse oximetry and re-evaluation of patient's condition. Patient with agitation requiring Ativan and multiple re-evaluations. Medications Ordered in ED Medications  LORazepam (ATIVAN) 2 MG/ML injection (has no administration in time range)  LORazepam (ATIVAN) injection 2 mg (2 mg Intramuscular Given 10/22/18 0403)  PHENobarbital (LUMINAL) tablet 64.8 mg (64.8 mg Oral Given 10/22/18 0634)    levETIRAcetam (KEPPRA) tablet 1,500 mg (1,500 mg Oral Given 10/22/18 0636)     Initial Impression / Assessment and Plan / ED Course  I have reviewed the triage vital signs and the nursing notes.  Pertinent labs & imaging results that were available during my care of the patient were reviewed by me and considered in my medical decision making (see chart for details).     4:11 AM Patient with history of seizures, dementia, cognitive delay presents for 2 seizures tonight at her health care facility.  Patient is unable provide any history and keeps yelling at staff.  Ativan has been given to patient.  Labs have been ordered.  Will follow closely 5:19 AM Patient with some improvement in mental status. Labs reassuring.  She is following commands.  I called her nursing facility at [facility] They Report "patient had 7 seizures and was bleeding from the mouth " She is on multiple medications for seizures.  We will consult neurology 5:33 AM Discussed the case with on-call neurology Dr. Laurence SlateAroor We reviewed lab findings. He recommends the following: One-time dose of phenobarb 64.8 Oral dose of Keppra 1500 mg Check urinalysis. Monitor for 2 to 3 hours. Recheck phenobarb level in 3 days as an outpatient Patient is likely near her baseline, she is awake and alert and following commands.  She is less combative. 7:28 AM She Has been monitored for an additional 2 hours.  She is in no acute distress, no seizures.  She is more cooperative and following commands.  I suspect she is at her baseline as she is now minimally verbal which is been described in previous neurology notes She told me to call her brother who is listed on her paperwork, but he did not answer. It was advised for to have her phenobarb level checked in 3 days No signs of UTI.  She will be discharged back to her healthcare facility Final Clinical Impressions(s) / ED Diagnoses   Final diagnoses:  Seizure [facility](HCC)    ED Discharge  Orders    None       Zadie RhineWickline, Andreas Sobolewski, MD 10/22/18 0730    Zadie RhineWickline, Wonda Goodgame, MD 10/22/18 [phone]

## 2018-10-22 NOTE — ED Notes (Addendum)
Spoke with Dorena Bodo, NP at [facility]. Given update on plan of care for discharge. Explained pt would need phenobarbitol level rechecked in 3 days. NP verbalized understanding.

## 2018-10-22 NOTE — ED Notes (Signed)
Report given to Vilinda Blanks; On call provider for [facility] who the is patients provider while at [facility].

## 2018-10-22 NOTE — ED Notes (Signed)
Called [facility] for transport back to [facility]

## 2018-10-22 NOTE — Discharge Instructions (Signed)
Please recheck the phenobarbital level in 3 days

## 2018-10-22 NOTE — ED Notes (Signed)
Pt left via [facility] at 830. Vitals entered prior to discharge.

## 2018-10-22 NOTE — ED Notes (Signed)
Patient vomited x 2 while in the hallway waiting for the room to be cleaned. Patient then continued to scream incomprehensible words and thrive around in bed while trying to clean her and hook to monitor.

## 2019-06-11 ENCOUNTER — Emergency Department (HOSPITAL_COMMUNITY): Payer: Medicare Other

## 2019-06-11 ENCOUNTER — Other Ambulatory Visit: Payer: Self-pay

## 2019-06-11 ENCOUNTER — Emergency Department (HOSPITAL_COMMUNITY)
Admission: EM | Admit: 2019-06-11 | Discharge: 2019-06-12 | Disposition: A | Discharge status: Home / Self Care | Payer: Medicare Other | Attending: Emergency Medicine | Admitting: Emergency Medicine

## 2019-06-11 ENCOUNTER — Encounter (HOSPITAL_COMMUNITY): Payer: Self-pay

## 2019-06-11 DIAGNOSIS — F89 Unspecified disorder of psychological development: Secondary | ICD-10-CM | POA: Insufficient documentation

## 2019-06-11 DIAGNOSIS — R4182 Altered mental status, unspecified: Secondary | ICD-10-CM | POA: Diagnosis not present

## 2019-06-11 DIAGNOSIS — I10 Essential (primary) hypertension: Secondary | ICD-10-CM | POA: Diagnosis not present

## 2019-06-11 DIAGNOSIS — F919 Conduct disorder, unspecified: Secondary | ICD-10-CM | POA: Diagnosis present

## 2019-06-11 DIAGNOSIS — N390 Urinary tract infection, site not specified: Secondary | ICD-10-CM | POA: Diagnosis not present

## 2019-06-11 DIAGNOSIS — Z79899 Other long term (current) drug therapy: Secondary | ICD-10-CM | POA: Diagnosis not present

## 2019-06-11 HISTORY — DX: Schizophrenia, unspecified: F20.9

## 2019-06-11 LAB — CBC WITH DIFFERENTIAL/PLATELET
Abs Immature Granulocytes: 0 10*3/uL (ref 0.00–0.07)
Basophils Absolute: 0 10*3/uL (ref 0.0–0.1)
Basophils Relative: 0 %
Eosinophils Absolute: 0 10*3/uL (ref 0.0–0.5)
Eosinophils Relative: 0 %
HCT: 36.9 % (ref 36.0–46.0)
Hemoglobin: 11.6 g/dL — ABNORMAL LOW (ref 12.0–15.0)
Immature Granulocytes: 0 %
Lymphocytes Relative: 36 %
Lymphs Abs: 2 10*3/uL (ref 0.7–4.0)
MCH: 28.1 pg (ref 26.0–34.0)
MCHC: 31.4 g/dL (ref 30.0–36.0)
MCV: 89.3 fL (ref 80.0–100.0)
Monocytes Absolute: 0.4 10*3/uL (ref 0.1–1.0)
Monocytes Relative: 8 %
Neutro Abs: 3.2 10*3/uL (ref 1.7–7.7)
Neutrophils Relative %: 56 %
Platelets: 230 10*3/uL (ref 150–400)
RBC: 4.13 MIL/uL (ref 3.87–5.11)
RDW: 12.4 % (ref 11.5–15.5)
WBC: 5.6 10*3/uL (ref 4.0–10.5)
nRBC: 0 % (ref 0.0–0.2)

## 2019-06-11 LAB — URINALYSIS, ROUTINE W REFLEX MICROSCOPIC
Bilirubin Urine: NEGATIVE
Glucose, UA: NEGATIVE mg/dL
Ketones, ur: NEGATIVE mg/dL
Nitrite: POSITIVE — AB
Protein, ur: NEGATIVE mg/dL
Specific Gravity, Urine: 1.013 (ref 1.005–1.030)
pH: 5 (ref 5.0–8.0)

## 2019-06-11 LAB — COMPREHENSIVE METABOLIC PANEL
ALT: 15 U/L (ref 0–44)
AST: 16 U/L (ref 15–41)
Albumin: 3.9 g/dL (ref 3.5–5.0)
Alkaline Phosphatase: 68 U/L (ref 38–126)
Anion gap: 9 (ref 5–15)
BUN: 22 mg/dL (ref 8–23)
CO2: 25 mmol/L (ref 22–32)
Calcium: 9 mg/dL (ref 8.9–10.3)
Chloride: 105 mmol/L (ref 98–111)
Creatinine, Ser: 0.64 mg/dL (ref 0.44–1.00)
GFR calc Af Amer: >60 mL/min (ref >60)
GFR calc non Af Amer: >60 mL/min (ref >60)
Glucose, Bld: 101 mg/dL — ABNORMAL HIGH (ref 70–99)
Potassium: 3.3 mmol/L — ABNORMAL LOW (ref 3.5–5.1)
Sodium: 139 mmol/L (ref 135–145)
Total Bilirubin: 0.3 mg/dL (ref 0.3–1.2)
Total Protein: 6.8 g/dL (ref 6.5–8.1)

## 2019-06-11 MED ORDER — CEPHALEXIN 500 MG PO CAPS: 500.0000 mg | ORAL_CAPSULE | Freq: Two times a day (BID) | ORAL | 0 refills | Status: AC

## 2019-06-11 MED ORDER — CEPHALEXIN 500 MG PO CAPS
500.0000 mg | ORAL_CAPSULE | Freq: Once | ORAL | Status: AC
  Administered 2019-06-11: 500 mg via ORAL
  Filled 2019-06-11: qty 1

## 2019-06-11 NOTE — ED Notes (Signed)
EMS says [facility] has had some positive covid cases on the memory unit but says is separate from where pt is and pt has not been in contact with positive patients.

## 2019-06-11 NOTE — ED Provider Notes (Signed)
[facility] EMERGENCY DEPARTMENT Provider Note   CSN: [id number] Arrival date & time: 06/11/19  1814     History   Chief Complaint No chief complaint on file.   HPI Christine Pena is a 63 y.o. female with past medical history of mental retardation, schizophrenia, dementia, hypertension, seizure disorder, sent to the ED from [facility] memory care unit for new behavior change that began this morning.  I obtained history from the [facility] [place].  She states patient was aggressive with residents and staff this morning which is not very usual for the patient however it is happened in the past.  She states her supervisor sent her to the ED to rule out UTI.  She denies any recent falls, fevers, vomiting, diarrhea, changes in p.o. intake.  She states patient has not been complaining of anything. On evaluation, very difficult to obtain history from patient due to mental retardation.  She denies abdominal pain or apparent urinary symptoms.  No other obvious complaints.     The history is provided by the nursing home. The history is limited by the condition of the patient.    Past Medical History:  Diagnosis Date  . Anxiety   . Dementia (HCC)   . Hyperlipidemia   . Hypertension   . Mental retardation   . Obesity   . Schizophrenia (HCC)   . 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 Seizures (HCC)   . Vitamin D deficiency     Patient Active Problem List   Diagnosis Date Noted  . Schizophrenia spectrum disorder with psychotic disorder type not yet determined (HCC) 11/13/2015  . Vitamin D deficiency 07/31/2015  . Bilateral lower extremity edema 07/31/2015  . Anxiety state 07/31/2015  . Hyperlipidemia 05/15/2015  . 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 Hypokalemia 10/10/2014  . Essential hypertension 10/08/2014  . Morbid obesity (HCC) 05/05/2014  . Seizure (HCC) 05/03/2014  . Mental retardation 01/06/2014    History reviewed. No pertinent  surgical history.   OB History   No obstetric history on file.      Home Medications    Prior to Admission medications   Medication Sig Start Date End Date Taking? Authorizing Provider  acetaminophen (TYLENOL) 500 MG tablet Take 500 mg by mouth every 4 (four) hours as needed for mild pain or fever.     [provider]  atorvastatin (LIPITOR) 10 MG tablet Take 10 mg by mouth daily.    [provider]  benztropine (COGENTIN) 1 MG tablet Take 1 mg by mouth 2 (two) times daily.    [provider]  carboxymethylcellulose 1 % ophthalmic solution Place 1 drop into both eyes 2 (two) times daily.     [provider]  cephALEXin (KEFLEX) 500 MG capsule Take 1 capsule (500 mg total) by mouth 2 (two) times daily for 7 days. 06/11/19 06/18/19  Saraiah Bhat, [place] N, PA-C  chlorhexidine (PERIDEX) 0.12 % solution Use as directed 20 mLs in the mouth or throat 2 (two) times daily.     [provider]  clonazePAM (KLONOPIN) 1 MG tablet Take 0.5 mg by mouth daily.     [provider]  famotidine (PEPCID) 20 MG tablet Take 20 mg by mouth daily.    [provider]  hydrocortisone (ANUSOL-HC) 2.5 % rectal cream Place 1 application rectally 2 (two) times daily as needed for hemorrhoids or itching. 11/13/15   [name], [name], NP  levETIRAcetam (KEPPRA) 750 MG tablet Take 2  tablets (1,500 mg total) by mouth 2 (two) times daily. 03/19/16   York Spaniel, MD  lisinopril (PRINIVIL,ZESTRIL) 10 MG tablet Take 10 mg by mouth daily.     [provider]  magnesium hydroxide (MILK OF MAGNESIA) 400 MG/5ML suspension Take 30 mLs by mouth daily as needed for mild constipation.    [provider]  neomycin-bacitracin-polymyxin (NEOSPORIN) ointment Apply 1 application topically daily.    [provider]  PHENobarbital (LUMINAL) 64.8 MG tablet Take 2 tablets (129.6 mg total) by mouth at bedtime. 05/19/15   Kirt Boys, DO  Propylene  Glycol-Glycerin (ARTIFICIAL TEARS) 1-0.3 % SOLN Place 2 drops into both eyes every 4 (four) hours as needed (dryness).    [provider]  QUEtiapine (SEROQUEL XR) 300 MG 24 hr tablet Take 150 mg by mouth at bedtime.    [provider]  QUEtiapine (SEROQUEL) 50 MG tablet Take 75 mg by mouth at bedtime.     [provider]  senna-docusate (SENOKOT-S) 8.6-50 MG tablet Take 1 tablet by mouth daily.    [provider]  sulfamethoxazole-trimethoprim (BACTRIM DS,SEPTRA DS) 800-160 MG tablet Take 1 tablet by mouth 2 (two) times daily.    [provider]  torsemide (DEMADEX) 10 MG tablet Take 10 mg by mouth 2 (two) times daily.    [provider]  vitamin E (VITAMIN E) 200 UNIT capsule Take 400 Units by mouth daily.     [provider]  zonisamide (ZONEGRAN) 50 MG capsule Take 100 mg by mouth 2 (two) times daily.    [provider]    Family History Family History  Problem Relation Age of Onset  . Heart disease Mother     Social History Social History   Tobacco Use  . Smoking status: Never Smoker  . Smokeless tobacco: Never Used  Substance Use Topics  . Alcohol use: No  . Drug use: No     Allergies   Depakote [valproic acid] and Haldol [haloperidol lactate]   Review of Systems Review of Systems  Unable to perform ROS: Other (mental retardation)     Physical Exam Updated Vital Signs BP 125/63   Pulse 70   Temp 98.2 F (36.8 C) (Oral)   Resp 18   SpO2 96%   Physical Exam Vitals signs and nursing note reviewed.  Constitutional:      Appearance: She is well-developed.     Comments: Patient is in no apparent distress. Pt is conversant and interactive. Cooperative.  She is not ill-appearing.  Patient is obese.  HENT:     Head: Normocephalic and atraumatic.  Eyes:     Conjunctiva/sclera: Conjunctivae normal.  Cardiovascular:     Rate and Rhythm: Normal rate and regular rhythm.     Pulses: Normal pulses.   Pulmonary:     Effort: Pulmonary effort is normal. No respiratory distress.     Breath sounds: Normal breath sounds.  Abdominal:     General: Bowel sounds are normal.     Palpations: Abdomen is soft.     Tenderness: There is no abdominal tenderness. There is no guarding or rebound.  Skin:    General: Skin is warm.  Neurological:     Mental Status: She is alert.  Psychiatric:        Behavior: Behavior normal.      ED Treatments / Results  Labs (all labs ordered are listed, but only abnormal results are displayed) Labs Reviewed  COMPREHENSIVE METABOLIC PANEL - Abnormal; Notable for  the following components:      Result Value   Potassium 3.3 (*)    Glucose, Bld 101 (*)    All other components within normal limits  CBC WITH DIFFERENTIAL/PLATELET - Abnormal; Notable for the following components:   Hemoglobin 11.6 (*)    All other components within normal limits  URINALYSIS, ROUTINE W REFLEX MICROSCOPIC - Abnormal; Notable for the following components:   APPearance HAZY (*)    Hgb urine dipstick SMALL (*)    Nitrite POSITIVE (*)    Leukocytes,Ua TRACE (*)    Bacteria, UA MANY (*)    All other components within normal limits  URINE CULTURE    EKG None  Radiology Ct Head Wo Contrast  Result Date: 06/11/2019 CLINICAL DATA:  Altered mental status EXAM: CT HEAD WITHOUT CONTRAST TECHNIQUE: Contiguous axial images were obtained from the base of the skull through the vertex without intravenous contrast. COMPARISON:  December 31, 2015 FINDINGS: Brain: No evidence of acute territorial infarction, hemorrhage, hydrocephalus,extra-axial collection or mass lesion/mass effect. There is dilatation the ventricles and sulci consistent with age-related atrophy, more prominently within the cerebellum as on prior. Vascular: No hyperdense vessel or unexpected calcification. Skull: The skull is intact. No fracture or focal lesion identified. Sinuses/Orbits: The visualized paranasal sinuses and mastoid  air cells are clear. The orbits and globes intact. Other: None IMPRESSION: No acute intracranial abnormality. Findings consistent with age related atrophy, most notably within the cerebellum as on prior. Electronically Signed   By: Jonna ClarkBindu  Avutu M.D.   On: 06/11/2019 21:17   Dg Chest Port 1 View  Result Date: 06/11/2019 CLINICAL DATA:  Altered mental status EXAM: PORTABLE CHEST 1 VIEW COMPARISON:  08/17/2015 FINDINGS: The cardiac silhouette is enlarged. There is vascular congestion without overt pulmonary edema. No pneumothorax or significant pleural effusion. No acute osseous abnormality. IMPRESSION: Enlargement of the cardiac silhouette with vascular congestion. Electronically Signed   By: Katherine Mantlehristopher  Green M.D.   On: 06/11/2019 20:06    Procedures Procedures (including critical care time)  Medications Ordered in ED Medications  cephALEXin (KEFLEX) capsule 500 mg (500 mg Oral Given 06/11/19 2159)     Initial Impression / Assessment and Plan / ED Course  I have reviewed the triage vital signs and the nursing notes.  Pertinent labs & imaging results that were available during my care of the patient were reviewed by me and considered in my medical decision making (see chart for details).        Patient sent to the ED from [facility] memory care unit with behavior change this morning.  She was sent with concern for possible UTI.  I [facility], who reports only new occurrences patient's hostility towards resident and staff today.  She has had normal p.o. intake, no vomiting, no diarrhea, no fever or cough.  She has had no complaints today.  No recent falls.  On exam, patient is in no apparent distress, interactive, calm and cooperative.  Heart and lung sounds are normal, abdomen is benign.  Vital signs are stable, afebrile.  Screening labs, chest x-ray and CT head obtained.   CBC without leukocytosis.  CMP with very mild hypokalemia 3.3, otherwise  unremarkable.  Normal glucose level.  Chest x-ray is negative for infiltrate.  CT head negative for bleed or new mass.  UA with signs of infection. Culture sent.  Will treat with keflex. At this time, patient is well-appearing in no distress, calm, cooperative, with reassuring workup.  No indication for further workup at this time. Pt is safe for discharge back to living facility.  Pt discussed with Dr. Thurnell Garbe, agrees with car eplan and discharge.  Final Clinical Impressions(s) / ED Diagnoses   Final diagnoses:  Acute lower UTI    ED Discharge Orders         Ordered    cephALEXin (KEFLEX) 500 MG capsule  2 times daily     06/11/19 2159           Francy Mcilvaine, [place] N, PA-C 06/11/19 [place], [place], DO 06/12/19 1559

## 2019-06-11 NOTE — ED Triage Notes (Signed)
Ems reports pt is a resident at [facility] on memory care unit.  Reports has been combative for the past 2 days and says this is usually how pt presents when she is getting a UTI.  Denies weakness or pain.  Pt has history of schizophrenia.

## 2019-06-11 NOTE — Discharge Instructions (Addendum)
Christine Pena urine shows signs of UTI. She has been given her evening dose of antibiotic here in the ED tonight. Please administer the antibiotic, as directed, until gone. Her lab work is otherwise reassuring. Her head CT is unchanged.

## 2019-06-14 LAB — URINE CULTURE: Culture: >=100000 — AB

## 2019-06-15 ENCOUNTER — Telehealth (HOSPITAL_BASED_OUTPATIENT_CLINIC_OR_DEPARTMENT_OTHER): Payer: Self-pay

## 2019-06-15 NOTE — Telephone Encounter (Signed)
Post ED Visit - Positive Culture Follow-up  Culture report reviewed by antimicrobial stewardship pharmacist: [place] Team []  Elenor Quinones, Pharm.D. []  Heide Guile, Pharm.D., BCPS AQ-ID []  Parks Neptune, Pharm.D., BCPS []  Alycia Rossetti, Pharm.D., BCPS []  [place], Pharm.D., BCPS, AAHIVP []  Legrand Como, Pharm.D., BCPS, AAHIVP []  Salome Arnt, PharmD, BCPS []  Johnnette Gourd, PharmD, BCPS []  Hughes Better, PharmD, BCPS []  Leeroy Cha, PharmD []  Laqueta Linden, PharmD, BCPS []  Albertina Parr, PharmD Stacy Gardner.D.  [place] Team []  Leodis Sias, PharmD []  Lindell Spar, PharmD []  Royetta Asal, PharmD []  Graylin Shiver, Rph []  Rema Fendt) Glennon Mac, PharmD []  Arlyn Dunning, PharmD []  Netta Cedars, PharmD []  Dia Sitter, PharmD []  Leone Haven, PharmD []  Gretta Arab, PharmD []  Theodis Shove, PharmD []  Peggyann Juba, PharmD []  Reuel Boom, PharmD   Positive urine culture >/=100,000 colonies/ml Escherichia Coli Treated with Cephalexin, organism sensitive to the same and no further patient follow-up is required at this time.  Dortha Kern 06/15/2019, 2:26 PM

## 2019-11-18 ENCOUNTER — Emergency Department
Admission: EM | Admit: 2019-11-18 | Discharge: 2019-11-20 | Disposition: A | Discharge status: Home / Self Care | Payer: Medicare Other | Attending: Student | Admitting: Student

## 2019-11-18 ENCOUNTER — Other Ambulatory Visit: Payer: Self-pay

## 2019-11-18 ENCOUNTER — Encounter: Payer: Self-pay | Admitting: Emergency Medicine

## 2019-11-18 DIAGNOSIS — Z79899 Other long term (current) drug therapy: Secondary | ICD-10-CM | POA: Diagnosis not present

## 2019-11-18 DIAGNOSIS — R600 Localized edema: Secondary | ICD-10-CM | POA: Diagnosis present

## 2019-11-18 DIAGNOSIS — F0281 Dementia in other diseases classified elsewhere with behavioral disturbance: Secondary | ICD-10-CM | POA: Insufficient documentation

## 2019-11-18 DIAGNOSIS — N3001 Acute cystitis with hematuria: Secondary | ICD-10-CM | POA: Insufficient documentation

## 2019-11-18 DIAGNOSIS — Z046 Encounter for general psychiatric examination, requested by authority: Secondary | ICD-10-CM | POA: Insufficient documentation

## 2019-11-18 DIAGNOSIS — G20 Parkinson's disease: Secondary | ICD-10-CM | POA: Insufficient documentation

## 2019-11-18 DIAGNOSIS — F29 Unspecified psychosis not due to a substance or known physiological condition: Secondary | ICD-10-CM | POA: Diagnosis present

## 2019-11-18 DIAGNOSIS — F209 Schizophrenia, unspecified: Secondary | ICD-10-CM | POA: Diagnosis not present

## 2019-11-18 DIAGNOSIS — E559 Vitamin D deficiency, unspecified: Secondary | ICD-10-CM | POA: Diagnosis present

## 2019-11-18 DIAGNOSIS — F79 Unspecified intellectual disabilities: Secondary | ICD-10-CM

## 2019-11-18 DIAGNOSIS — R4689 Other symptoms and signs involving appearance and behavior: Secondary | ICD-10-CM | POA: Diagnosis present

## 2019-11-18 DIAGNOSIS — E785 Hyperlipidemia, unspecified: Secondary | ICD-10-CM | POA: Diagnosis present

## 2019-11-18 DIAGNOSIS — G219 Secondary parkinsonism, unspecified: Secondary | ICD-10-CM | POA: Diagnosis present

## 2019-11-18 DIAGNOSIS — R410 Disorientation, unspecified: Secondary | ICD-10-CM

## 2019-11-18 DIAGNOSIS — E876 Hypokalemia: Secondary | ICD-10-CM | POA: Diagnosis present

## 2019-11-18 DIAGNOSIS — I10 Essential (primary) hypertension: Secondary | ICD-10-CM | POA: Insufficient documentation

## 2019-11-18 DIAGNOSIS — R569 Unspecified convulsions: Secondary | ICD-10-CM

## 2019-11-18 DIAGNOSIS — F411 Generalized anxiety disorder: Secondary | ICD-10-CM | POA: Diagnosis present

## 2019-11-18 LAB — COMPREHENSIVE METABOLIC PANEL WITH GFR
ALT: 13 U/L (ref 0–44)
AST: 17 U/L (ref 15–41)
Albumin: 4.1 g/dL (ref 3.5–5.0)
Alkaline Phosphatase: 64 U/L (ref 38–126)
Anion gap: 12 (ref 5–15)
BUN: 20 mg/dL (ref 8–23)
CO2: 24 mmol/L (ref 22–32)
Calcium: 9.4 mg/dL (ref 8.9–10.3)
Chloride: 105 mmol/L (ref 98–111)
Creatinine, Ser: 0.91 mg/dL (ref 0.44–1.00)
GFR calc Af Amer: >60 mL/min (ref >60)
GFR calc non Af Amer: >60 mL/min (ref >60)
Glucose, Bld: 106 mg/dL — ABNORMAL HIGH (ref 70–99)
Potassium: 3.5 mmol/L (ref 3.5–5.1)
Sodium: 141 mmol/L (ref 135–145)
Total Bilirubin: 0.7 mg/dL (ref 0.3–1.2)
Total Protein: 7.1 g/dL (ref 6.5–8.1)

## 2019-11-18 LAB — CBC
HCT: 35.9 % — ABNORMAL LOW (ref 36.0–46.0)
Hemoglobin: 11.9 g/dL — ABNORMAL LOW (ref 12.0–15.0)
MCH: 28.6 pg (ref 26.0–34.0)
MCHC: 33.1 g/dL (ref 30.0–36.0)
MCV: 86.3 fL (ref 80.0–100.0)
Platelets: 272 10*3/uL (ref 150–400)
RBC: 4.16 MIL/uL (ref 3.87–5.11)
RDW: 12.7 % (ref 11.5–15.5)
WBC: 6.4 10*3/uL (ref 4.0–10.5)
nRBC: 0 % (ref 0.0–0.2)

## 2019-11-18 LAB — URINALYSIS, COMPLETE (UACMP) WITH MICROSCOPIC
Bilirubin Urine: NEGATIVE
Glucose, UA: NEGATIVE mg/dL
Hgb urine dipstick: NEGATIVE
Ketones, ur: NEGATIVE mg/dL
Nitrite: POSITIVE — AB
Protein, ur: 30 mg/dL — AB
Specific Gravity, Urine: 1.018 (ref 1.005–1.030)
WBC, UA: >50 WBC/hpf — ABNORMAL HIGH (ref 0–5)
pH: 5 (ref 5.0–8.0)

## 2019-11-18 LAB — ETHANOL: Alcohol, Ethyl (B): <10 mg/dL (ref <10)

## 2019-11-18 LAB — SALICYLATE LEVEL: Salicylate Lvl: <7 mg/dL — ABNORMAL LOW (ref 7.0–30.0)

## 2019-11-18 LAB — URINE DRUG SCREEN, QUALITATIVE (ARMC ONLY)
Amphetamines, Ur Screen: NOT DETECTED
Barbiturates, Ur Screen: POSITIVE — AB
Benzodiazepine, Ur Scrn: POSITIVE — AB
Cannabinoid 50 Ng, Ur ~~LOC~~: NOT DETECTED
Cocaine Metabolite,Ur ~~LOC~~: NOT DETECTED
MDMA (Ecstasy)Ur Screen: NOT DETECTED
Methadone Scn, Ur: NOT DETECTED
Opiate, Ur Screen: NOT DETECTED
Phencyclidine (PCP) Ur S: NOT DETECTED
Tricyclic, Ur Screen: POSITIVE — AB

## 2019-11-18 LAB — ACETAMINOPHEN LEVEL: Acetaminophen (Tylenol), Serum: <10 ug/mL — ABNORMAL LOW (ref 10–30)

## 2019-11-18 MED ORDER — LEVETIRACETAM 500 MG PO TABS
1500.0000 mg | ORAL_TABLET | Freq: Two times a day (BID) | ORAL | Status: DC
  Administered 2019-11-18 – 2019-11-19 (×3): 1500 mg via ORAL
  Filled 2019-11-18 (×3): qty 3

## 2019-11-18 MED ORDER — TORSEMIDE 10 MG PO TABS
10.0000 mg | ORAL_TABLET | Freq: Two times a day (BID) | ORAL | Status: DC
  Administered 2019-11-18 – 2019-11-19 (×3): 10 mg via ORAL
  Filled 2019-11-18 (×4): qty 1

## 2019-11-18 MED ORDER — HYDROCERIN EX CREA
1.0000 "application " | TOPICAL_CREAM | Freq: Two times a day (BID) | CUTANEOUS | Status: DC
  Filled 2019-11-18: qty 113

## 2019-11-18 MED ORDER — VITAMIN D 25 MCG (1000 UNIT) PO TABS
5000.0000 [IU] | ORAL_TABLET | Freq: Every day | ORAL | Status: DC
  Administered 2019-11-19: 5000 [IU] via ORAL
  Filled 2019-11-18: qty 5

## 2019-11-18 MED ORDER — CLONAZEPAM 0.5 MG PO TABS
0.5000 mg | ORAL_TABLET | Freq: Every day | ORAL | Status: DC
  Administered 2019-11-18 – 2019-11-19 (×2): 0.5 mg via ORAL
  Filled 2019-11-18 (×2): qty 1

## 2019-11-18 MED ORDER — BENZTROPINE MESYLATE 1 MG PO TABS
1.0000 mg | ORAL_TABLET | Freq: Two times a day (BID) | ORAL | Status: DC
  Administered 2019-11-18 – 2019-11-19 (×3): 1 mg via ORAL
  Filled 2019-11-18 (×3): qty 1

## 2019-11-18 MED ORDER — PAROXETINE HCL 20 MG PO TABS
40.0000 mg | ORAL_TABLET | Freq: Every day | ORAL | Status: DC
  Administered 2019-11-19: 40 mg via ORAL
  Filled 2019-11-18 (×2): qty 2

## 2019-11-18 MED ORDER — CHLORHEXIDINE GLUCONATE 0.12 % MT SOLN
20.0000 mL | Freq: Two times a day (BID) | OROMUCOSAL | Status: DC
  Filled 2019-11-18: qty 30

## 2019-11-18 MED ORDER — LISINOPRIL 10 MG PO TABS
10.0000 mg | ORAL_TABLET | Freq: Every day | ORAL | Status: DC
  Administered 2019-11-18: 10 mg via ORAL
  Filled 2019-11-18 (×2): qty 1

## 2019-11-18 MED ORDER — PANTOPRAZOLE SODIUM 40 MG PO TBEC
40.0000 mg | DELAYED_RELEASE_TABLET | Freq: Every day | ORAL | Status: DC
  Administered 2019-11-18 – 2019-11-19 (×2): 40 mg via ORAL
  Filled 2019-11-18 (×2): qty 1

## 2019-11-18 MED ORDER — POLYVINYL ALCOHOL 1.4 % OP SOLN
1.0000 [drp] | Freq: Two times a day (BID) | OPHTHALMIC | Status: DC
  Administered 2019-11-19 (×2): 1 [drp] via OPHTHALMIC
  Filled 2019-11-18: qty 15

## 2019-11-18 MED ORDER — ROSUVASTATIN CALCIUM 10 MG PO TABS
10.0000 mg | ORAL_TABLET | Freq: Every day | ORAL | Status: DC
  Administered 2019-11-18 – 2019-11-19 (×2): 10 mg via ORAL
  Filled 2019-11-18 (×2): qty 1

## 2019-11-18 MED ORDER — ZONISAMIDE 25 MG PO CAPS
50.0000 mg | ORAL_CAPSULE | Freq: Two times a day (BID) | ORAL | Status: DC
  Administered 2019-11-18 – 2019-11-19 (×3): 50 mg via ORAL
  Filled 2019-11-18 (×4): qty 2

## 2019-11-18 MED ORDER — PHENOBARBITAL 32.4 MG PO TABS
64.8000 mg | ORAL_TABLET | Freq: Two times a day (BID) | ORAL | Status: DC
  Administered 2019-11-18 – 2019-11-19 (×3): 64.8 mg via ORAL
  Filled 2019-11-18 (×3): qty 2
  Filled 2019-11-18: qty 1
  Filled 2019-11-18: qty 2

## 2019-11-18 MED ORDER — QUETIAPINE FUMARATE 300 MG PO TABS
300.0000 mg | ORAL_TABLET | Freq: Every day | ORAL | Status: DC
  Administered 2019-11-18 – 2019-11-19 (×2): 300 mg via ORAL
  Filled 2019-11-18 (×2): qty 1

## 2019-11-18 MED ORDER — SENNOSIDES-DOCUSATE SODIUM 8.6-50 MG PO TABS
1.0000 | ORAL_TABLET | Freq: Every day | ORAL | Status: DC
  Administered 2019-11-18 – 2019-11-19 (×2): 1 via ORAL
  Filled 2019-11-18 (×2): qty 1
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
## 2019-11-18 NOTE — ED Notes (Signed)
Patient has teddy bear with her. Bear scanned by security with no issues. Patient dressed in scrubs. Belongings placed in one bag and taken to [employer]

## 2019-11-18 NOTE — ED Notes (Signed)
Patient given peanut butter and graham crackers. Took meds whole with water
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
## 2019-11-18 NOTE — ED Notes (Signed)
This RN spoke w/ pharmacy tech to verify pt's meds per MD order.

## 2019-11-18 NOTE — ED Notes (Signed)
No urine collected at this time. Purewick in place.

## 2019-11-18 NOTE — ED Notes (Signed)
Patient is calm and cooperative but will not keep purewick in place to catch urine sample. Attempted to get patient to toilet and she gets agitated. Will attempt again later. Patient is not combative at all

## 2019-11-18 NOTE — ED Notes (Signed)
This RN and EDT Shawna Orleans dressed pt out in [facility] scrubs.   Belongings placed in bag as follows:  1 pair grey socks  1 long sleeve blue shirt 1 pair grey snowflake pajama pants
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
## 2019-11-19 DIAGNOSIS — N3001 Acute cystitis with hematuria: Secondary | ICD-10-CM | POA: Diagnosis not present

## 2019-11-19 MED ORDER — CEPHALEXIN 500 MG PO CAPS: 500.0000 mg | ORAL_CAPSULE | Freq: Three times a day (TID) | ORAL | 0 refills | Status: AC

## 2019-11-19 MED ORDER — CEPHALEXIN 500 MG PO CAPS
500.0000 mg | ORAL_CAPSULE | Freq: Three times a day (TID) | ORAL | Status: DC
  Administered 2019-11-19 (×3): 500 mg via ORAL
  Filled 2019-11-19 (×3): qty 1

## 2019-11-19 NOTE — BH Assessment (Signed)
Assessment Note  Christine Pena is an 64 y.o. female presenting to [facility] ED under IVC from her facility [facility] due to aggressive behaviors, TTS unable to complete assessment as patient was unable to answer questions.  Per Psyc NP The patient will remain under observation overnight and reassess in the a.m. to determine if she meets criteria for psychiatric inpatient admission or could be discharged back to her facility.  Diagnosis: Aggressive Behaviors  Past Medical History:  Past Medical History:  Diagnosis Date  . Anxiety   . Dementia ([place])   . Hyperlipidemia   . Hypertension   . Mental retardation   . Obesity   . Schizophrenia ([place])   . 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 Seizures ([place])   . Vitamin D deficiency     History reviewed. No pertinent surgical history.  Family History:  Family History  Problem Relation Age of Onset  . Heart disease Mother     Social History:  reports that she has never smoked. She has never used smokeless tobacco. She reports that she does not drink alcohol or use drugs.  Additional Social History:  Alcohol / Drug Use Pain Medications: See MAR Prescriptions: See MAR Over the Counter: See MAR  CIWA: CIWA-Ar BP: 107/68 Pulse Rate: 60 COWS:    Allergies:  Allergies  Allergen Reactions  . Depakote [Valproic Acid] Other (See Comments)    altered mental status/ high ammonia  . Haldol [Haloperidol Lactate] Other (See Comments)    Unknown reaction.    Home Medications: (Not in a hospital admission)   OB/GYN Status:  No LMP recorded. Patient is postmenopausal.  General Assessment Data Assessment unable to be completed: Yes Reason for not completing assessment: Patient was not able to answer questions Location of Assessment: [facility] ED TTS Assessment: In system Admission Status: Involuntary                    Mental Status Report Motor Activity: Restlessness     ADLScreening [facility] Assessment  Services) Patient's cognitive ability adequate to safely complete daily activities?: (Unable to assess) Patient able to express need for assistance with ADLs?: (Unable to assess) Independently performs ADLs?: (Unable to assess)        ADL Screening (condition at time of admission) Patient's cognitive ability adequate to safely complete daily activities?: (Unable to assess) Is the patient deaf or have difficulty hearing?: (Unable to assess) Does the patient have difficulty seeing, even when wearing glasses/contacts?: (Unable to assess) Does the patient have difficulty concentrating, remembering, or making decisions?: (Unable to assess) Patient able to express need for assistance with ADLs?: (Unable to assess) Does the patient have difficulty dressing or bathing?: (Unable to assess) Independently performs ADLs?: (Unable to assess) Does the patient have difficulty walking or climbing stairs?: (Unable to assess) Weakness of Legs: (Unable to assess) Weakness of Arms/Hands: (Unable to assess)     Therapy Consults (therapy consults require a physician order) PT Evaluation Needed: No OT Evalulation Needed: No SLP Evaluation Needed: No Abuse/Neglect Assessment (Assessment to be complete while patient is alone) Abuse/Neglect Assessment Can Be Completed: Unable to assess, patient is non-responsive or altered mental status     Advance Directives (For Healthcare) Does Patient Have a Medical Advance Directive?: No          Disposition: Per Psyc NP The patient will remain under observation overnight and reassess in the a.m. to determine if she meets criteria for psychiatric inpatient admission or could be discharged back  to her facility    On Site Evaluation by:   Reviewed with Physician:    Benay Pike MS LCASA 11/19/2019 5:42 AM

## 2019-11-19 NOTE — ED Notes (Addendum)
[facility] called; told to call back at 0700 for transport if facility unable to find transportation

## 2019-11-19 NOTE — ED Notes (Signed)
Toniann Fail, caregiver for pt on 2nd called and report given for pt to return; Toniann Fail reports that "I've never had her be ugly to me, but some of the others have.  I think it's how you approach her" -- this was after I related how pt didn't want to lower the covers for me to give meds, but after I started to interact with pt regarding the [employer] show she was watching pt became very compliant; pt took all meds in two swallows and drank 2 cups of water.  Pt notably would not walk to toilet to attempt to toilet or allow the skin protectant.    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    Toniann Fail reports no transportation att, but may have availability in the am call between [phone].  [facility] will be contacted.

## 2019-11-19 NOTE — ED Notes (Signed)
EDP notified of patient's urinalysis results and this RN's concerns. Urine culture and abx to be ordered

## 2019-11-19 NOTE — ED Provider Notes (Addendum)
  Physical Exam  BP 106/78 (BP Location: Right Arm)   Pulse 64   Temp 98.5 F (36.9 C) (Oral)   Resp 18   Ht 5\' 4"  (1.626 m)   Wt 99.8 kg   SpO2 96%   BMI 37.76 kg/m   Physical Exam  ED Course/Procedures     Procedures  MDM  Psych reassess patient and recommend discharge. IVC rescinded by psychiatry. Patient has some developmental disorder and this is back to baseline. She does have a UTI and will be discharged home with 7-day course of Keflex as prescribed by previous provider.  She is stable to go back to facility.   , MD 11/19/19 2128    2129, MD 11/19/19 2130

## 2019-11-19 NOTE — ED Notes (Signed)
Jerilynn Som given Government social research officer number at [facility] for discharge.

## 2019-11-19 NOTE — ED Notes (Signed)
Staff called from [facility] regarding patient. This Clinical research associate was directed to the [employer] the Interior and spatial designer for contact information, whose number is [phone]. Tresa Endo states patient is a fall risk and will throw herself on the ground at times.

## 2019-11-19 NOTE — ED Notes (Addendum)
Pt lying in bed with cover over head; pt occasionally yelling out, indirectly answers questions (moves head with yes or no questions)  Call from TTS: pt needs psych clearance prior to being DC'd back to facilty

## 2019-11-19 NOTE — ED Notes (Signed)
Pt this AM is calm and cooperative, polite with staff, takes meds easily. Pt able to ambulate to toilet, but does not alert the RN to when she needs to use the bathroom, even when prompted. Pt changed, cleaned, and bedding changed. Pt seems preoccupied with "the cops," and speaks frequently about being displeased with them or not wanting them nearby. No LEO or security have attempted to access or interact with pt this AM, and this RN is unable to identify what exactly the pt anxiety stems from.

## 2019-11-19 NOTE — BH Assessment (Cosign Needed)
Patient reassessed for psych clearance.  Per am MD recommendation patient has been reassessed and is Psych cleared. Pt denies HI, SI and AVH. Patient's behavior is at baseline. Patient has started keflex for uti.

## 2019-11-19 NOTE — ED Notes (Signed)
Pt ambulatory from room 26 to 23 without difficulty.  Pt is cooperative at this time.

## 2019-11-19 NOTE — BH Assessment (Signed)
Several attempts have been made to speak with the [facility] [phone]) but unable to reach anyone. Writer forwarded to a voicemail inbox that can not take any more messages because it is full.

## 2019-11-19 NOTE — ED Provider Notes (Signed)
UA positive for UTI with no signs of sepsis. No flank pain. Culture sent. Patient started on keflex 500mg  TID x 7 days.   , Don Perking, MD 11/19/19 11/21/19

## 2019-11-19 NOTE — ED Notes (Signed)
Pt ambulated to toilet with no problems or complaints.

## 2019-11-19 NOTE — Discharge Instructions (Addendum)
Take keflex three times daily for a week for urinary tract infection  Our psychiatrist saw her and felt that she does not have a primary psychiatric problem currently.  Her confusion may be from the urinary tract infection.  See your doctor   Return to ER if she has worse confusion, agitation, thoughts of harming herself or others.

## 2019-11-19 NOTE — ED Notes (Signed)
Patient became agitated and started screaming when this RN attempted to change her brief or even see if she had used the bathroom. Attempted x2 in 1 hour- will not attempt again on this shift. Seems to make patient very angry to be woken up. Will pass this info along to next shift RN. Also screamed when this RN attempted to place bed alarm on stretcher. Patient does not attempt to get out of bed but this RN will notify oncoming RN

## 2019-11-20 LAB — URINE CULTURE: Culture: 90000 — AB

## 2019-11-20 NOTE — ED Notes (Signed)
Called [facility] to transport pt back to [facility] was told they would call me back

## 2019-11-20 NOTE — ED Notes (Signed)
Pt calling out - this RN to bedside pt denies needs and won't get up to toilet

## 2019-11-20 NOTE — ED Notes (Signed)
Call to Joyce Gross, tech at [facility], Joyce Gross reports no available transportation on weekends, told we'll call [facility] at [phone] and expect pt approx [phone]

## 2019-11-20 NOTE — ED Notes (Signed)
Pt unable to sign for discharge d/t developmental delays- staff at [facility] aware of her return this morning

## 2019-11-20 NOTE — ED Notes (Signed)
[facility] called this RN back and stated they would be here to pick up pt in approximately 20 minutes

## 2019-11-20 NOTE — ED Notes (Signed)
Pt changed back into her clothes and given warm blanket

## 2019-11-20 NOTE — ED Provider Notes (Signed)
-----------------------------------------   6:00 AM on 11/20/2019 -----------------------------------------   Blood pressure 102/65, pulse 60, temperature 98.5 F (36.9 C), temperature source Oral, resp. rate 17, height 5\' 4"  (1.626 m), weight 99.8 kg, SpO2 98 %.  There have been no acute events since the last update.  Group home unable to pick patient up overnight.  She will be discharged in the morning once we have secured a ride for her.   , MD 11/20/19 [phone]

## 2020-07-03 ENCOUNTER — Emergency Department
Admission: EM | Admit: 2020-07-03 | Discharge: 2020-07-06 | Disposition: A | Discharge status: Home / Self Care | Payer: Medicare Other | Attending: Emergency Medicine | Admitting: Emergency Medicine

## 2020-07-03 ENCOUNTER — Encounter: Payer: Self-pay | Admitting: Emergency Medicine

## 2020-07-03 ENCOUNTER — Other Ambulatory Visit: Payer: Self-pay

## 2020-07-03 DIAGNOSIS — F0390 Unspecified dementia without behavioral disturbance: Secondary | ICD-10-CM | POA: Insufficient documentation

## 2020-07-03 DIAGNOSIS — R440 Auditory hallucinations: Secondary | ICD-10-CM | POA: Diagnosis not present

## 2020-07-03 DIAGNOSIS — F79 Unspecified intellectual disabilities: Secondary | ICD-10-CM | POA: Insufficient documentation

## 2020-07-03 DIAGNOSIS — I509 Heart failure, unspecified: Secondary | ICD-10-CM | POA: Insufficient documentation

## 2020-07-03 DIAGNOSIS — Z20822 Contact with and (suspected) exposure to covid-19: Secondary | ICD-10-CM | POA: Diagnosis not present

## 2020-07-03 DIAGNOSIS — R451 Restlessness and agitation: Secondary | ICD-10-CM | POA: Diagnosis not present

## 2020-07-03 DIAGNOSIS — Z79899 Other long term (current) drug therapy: Secondary | ICD-10-CM | POA: Insufficient documentation

## 2020-07-03 DIAGNOSIS — I110 Hypertensive heart disease with heart failure: Secondary | ICD-10-CM | POA: Diagnosis not present

## 2020-07-03 DIAGNOSIS — G219 Secondary parkinsonism, unspecified: Secondary | ICD-10-CM | POA: Insufficient documentation

## 2020-07-03 DIAGNOSIS — Z046 Encounter for general psychiatric examination, requested by authority: Secondary | ICD-10-CM | POA: Diagnosis present

## 2020-07-03 HISTORY — DX: Dysphagia, unspecified: R13.10

## 2020-07-03 HISTORY — DX: Heart failure, unspecified: I50.9

## 2020-07-03 LAB — COMPREHENSIVE METABOLIC PANEL
ALT: 14 U/L (ref 0–44)
AST: 17 U/L (ref 15–41)
Albumin: 4 g/dL (ref 3.5–5.0)
Alkaline Phosphatase: 60 U/L (ref 38–126)
Anion gap: 11 (ref 5–15)
BUN: 24 mg/dL — ABNORMAL HIGH (ref 8–23)
CO2: 28 mmol/L (ref 22–32)
Calcium: 9.2 mg/dL (ref 8.9–10.3)
Chloride: 102 mmol/L (ref 98–111)
Creatinine, Ser: 0.66 mg/dL (ref 0.44–1.00)
GFR, Estimated: >60 mL/min (ref >60)
Glucose, Bld: 86 mg/dL (ref 70–99)
Potassium: 4.4 mmol/L (ref 3.5–5.1)
Sodium: 141 mmol/L (ref 135–145)
Total Bilirubin: 0.5 mg/dL (ref 0.3–1.2)
Total Protein: 7.1 g/dL (ref 6.5–8.1)

## 2020-07-03 LAB — CBC
HCT: 32.6 % — ABNORMAL LOW (ref 36.0–46.0)
Hemoglobin: 10.9 g/dL — ABNORMAL LOW (ref 12.0–15.0)
MCH: 30.3 pg (ref 26.0–34.0)
MCHC: 33.4 g/dL (ref 30.0–36.0)
MCV: 90.6 fL (ref 80.0–100.0)
Platelets: 240 10*3/uL (ref 150–400)
RBC: 3.6 MIL/uL — ABNORMAL LOW (ref 3.87–5.11)
RDW: 12.3 % (ref 11.5–15.5)
WBC: 6.9 10*3/uL (ref 4.0–10.5)
nRBC: 0 % (ref 0.0–0.2)

## 2020-07-03 LAB — ETHANOL: Alcohol, Ethyl (B): <10 mg/dL (ref <10)

## 2020-07-03 LAB — ACETAMINOPHEN LEVEL: Acetaminophen (Tylenol), Serum: <10 ug/mL — ABNORMAL LOW (ref 10–30)

## 2020-07-03 LAB — SALICYLATE LEVEL: Salicylate Lvl: <7 mg/dL — ABNORMAL LOW (ref 7.0–30.0)

## 2020-07-03 NOTE — ED Notes (Signed)
Patient transferred from Triage to room 23 after dressing out and screening for contraband. Report received from [place], [place] including situation, background, assessment and recommendations. Pt oriented to [employer] including Q15 minute rounds as well as Psychologist, counselling for their protection.

## 2020-07-03 NOTE — ED Notes (Signed)
Pt will not speak to nurse at this time, pt pulls blanket over head and will not look at this nurse. Will attempt to assess when pt will communicate
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
## 2020-07-04 ENCOUNTER — Emergency Department: Payer: Medicare Other

## 2020-07-04 DIAGNOSIS — R451 Restlessness and agitation: Secondary | ICD-10-CM | POA: Diagnosis not present

## 2020-07-04 LAB — URINALYSIS, COMPLETE (UACMP) WITH MICROSCOPIC
Bilirubin Urine: NEGATIVE
Glucose, UA: NEGATIVE mg/dL
Ketones, ur: NEGATIVE mg/dL
Nitrite: NEGATIVE
Protein, ur: >=300 mg/dL — AB
Specific Gravity, Urine: 1.022 (ref 1.005–1.030)
pH: 7 (ref 5.0–8.0)

## 2020-07-04 LAB — RESPIRATORY PANEL BY RT PCR (FLU A&B, COVID)
Influenza A by PCR: NEGATIVE
Influenza B by PCR: NEGATIVE
SARS Coronavirus 2 by RT PCR: NEGATIVE

## 2020-07-04 LAB — URINE DRUG SCREEN, QUALITATIVE (ARMC ONLY)
Amphetamines, Ur Screen: NOT DETECTED
Barbiturates, Ur Screen: POSITIVE — AB
Benzodiazepine, Ur Scrn: NOT DETECTED
Cannabinoid 50 Ng, Ur ~~LOC~~: NOT DETECTED
Cocaine Metabolite,Ur ~~LOC~~: NOT DETECTED
MDMA (Ecstasy)Ur Screen: NOT DETECTED
Methadone Scn, Ur: NOT DETECTED
Opiate, Ur Screen: NOT DETECTED
Phencyclidine (PCP) Ur S: NOT DETECTED
Tricyclic, Ur Screen: NOT DETECTED

## 2020-07-04 MED ORDER — CHLORHEXIDINE GLUCONATE 0.12 % MT SOLN
20.0000 mL | Freq: Two times a day (BID) | OROMUCOSAL | Status: DC
  Administered 2020-07-05 (×2): 20 mL via OROMUCOSAL
  Filled 2020-07-04 (×2): qty 30

## 2020-07-04 MED ORDER — LOPERAMIDE HCL 2 MG PO CAPS: 2.0000 mg | ORAL_CAPSULE | ORAL | Status: DC | PRN

## 2020-07-04 MED ORDER — LORAZEPAM 2 MG PO TABS
2.0000 mg | ORAL_TABLET | Freq: Four times a day (QID) | ORAL | Status: DC | PRN
  Administered 2020-07-05: 2 mg via ORAL
  Filled 2020-07-04: qty 1

## 2020-07-04 MED ORDER — THIOTHIXENE 1 MG PO CAPS
1.0000 mg | ORAL_CAPSULE | Freq: Three times a day (TID) | ORAL | Status: DC
  Administered 2020-07-05 – 2020-07-06 (×4): 1 mg via ORAL
  Filled 2020-07-04 (×10): qty 1

## 2020-07-04 MED ORDER — ZIPRASIDONE MESYLATE 20 MG IM SOLR: 10.0000 mg | Freq: Four times a day (QID) | INTRAMUSCULAR | Status: DC | PRN

## 2020-07-04 MED ORDER — ALUM & MAG HYDROXIDE-SIMETH 200-200-20 MG/5ML PO SUSP: 30.0000 mL | Freq: Four times a day (QID) | ORAL | Status: DC | PRN

## 2020-07-04 MED ORDER — GUAIFENESIN 100 MG/5ML PO SYRP
200.0000 mg | ORAL_SOLUTION | Freq: Four times a day (QID) | ORAL | Status: DC | PRN
  Filled 2020-07-04: qty 10

## 2020-07-04 MED ORDER — PANTOPRAZOLE SODIUM 40 MG PO TBEC
40.0000 mg | DELAYED_RELEASE_TABLET | Freq: Every day | ORAL | Status: DC
  Administered 2020-07-04 – 2020-07-06 (×3): 40 mg via ORAL
  Filled 2020-07-04 (×4): qty 1

## 2020-07-04 MED ORDER — QUETIAPINE FUMARATE 200 MG PO TABS
200.0000 mg | ORAL_TABLET | Freq: Every day | ORAL | Status: DC
  Administered 2020-07-04 – 2020-07-05 (×2): 200 mg via ORAL
  Filled 2020-07-04 (×2): qty 1

## 2020-07-04 MED ORDER — ROSUVASTATIN CALCIUM 10 MG PO TABS: 10.0000 mg | ORAL_TABLET | Freq: Every day | ORAL | Status: DC

## 2020-07-04 MED ORDER — ZONISAMIDE 50 MG PO CAPS: 50.0000 mg | ORAL_CAPSULE | Freq: Two times a day (BID) | ORAL | Status: DC

## 2020-07-04 MED ORDER — POLYVINYL ALCOHOL 1.4 % OP SOLN
2.0000 [drp] | OPHTHALMIC | Status: DC | PRN
  Filled 2020-07-04: qty 15

## 2020-07-04 MED ORDER — OXYBUTYNIN CHLORIDE 5 MG PO TABS
5.0000 mg | ORAL_TABLET | Freq: Every day | ORAL | Status: DC
  Administered 2020-07-04 – 2020-07-06 (×3): 5 mg via ORAL
  Filled 2020-07-04 (×4): qty 1

## 2020-07-04 MED ORDER — LISINOPRIL 10 MG PO TABS
10.0000 mg | ORAL_TABLET | Freq: Every day | ORAL | Status: DC
  Administered 2020-07-04 – 2020-07-06 (×3): 10 mg via ORAL
  Filled 2020-07-04 (×3): qty 1

## 2020-07-04 MED ORDER — VITAMIN D 25 MCG (1000 UNIT) PO TABS
5000.0000 [IU] | ORAL_TABLET | Freq: Every day | ORAL | Status: DC
  Administered 2020-07-05 – 2020-07-06 (×2): 5000 [IU] via ORAL
  Filled 2020-07-04 (×3): qty 5

## 2020-07-04 MED ORDER — ZONISAMIDE 25 MG PO CAPS
50.0000 mg | ORAL_CAPSULE | Freq: Every day | ORAL | Status: DC
  Administered 2020-07-05 – 2020-07-06 (×2): 50 mg via ORAL
  Filled 2020-07-04 (×3): qty 2

## 2020-07-04 MED ORDER — ARIPIPRAZOLE 5 MG PO TABS: 2.5000 mg | ORAL_TABLET | Freq: Every day | ORAL | Status: DC

## 2020-07-04 MED ORDER — QUETIAPINE FUMARATE 300 MG PO TABS: 300.0000 mg | ORAL_TABLET | Freq: Every day | ORAL | Status: DC

## 2020-07-04 MED ORDER — LEVETIRACETAM 500 MG PO TABS
1500.0000 mg | ORAL_TABLET | Freq: Two times a day (BID) | ORAL | Status: DC
  Administered 2020-07-04 – 2020-07-06 (×5): 1500 mg via ORAL
  Filled 2020-07-04 (×5): qty 3

## 2020-07-04 MED ORDER — LORAZEPAM 2 MG/ML IJ SOLN: 2.0000 mg | Freq: Four times a day (QID) | INTRAMUSCULAR | Status: DC | PRN

## 2020-07-04 MED ORDER — VITAMIN B-12 1000 MCG PO TABS: 1000.0000 ug | ORAL_TABLET | Freq: Every day | ORAL | Status: DC

## 2020-07-04 MED ORDER — MAGNESIUM HYDROXIDE 400 MG/5ML PO SUSP: 30.0000 mL | Freq: Every evening | ORAL | Status: DC | PRN

## 2020-07-04 MED ORDER — CLONAZEPAM 0.5 MG PO TABS
2.0000 mg | ORAL_TABLET | Freq: Two times a day (BID) | ORAL | Status: DC
  Administered 2020-07-04 – 2020-07-06 (×4): 2 mg via ORAL
  Filled 2020-07-04 (×3): qty 4

## 2020-07-04 MED ORDER — PROPYLENE GLYCOL-GLYCERIN 1-0.3 % OP SOLN: 2.0000 [drp] | OPHTHALMIC | Status: DC | PRN

## 2020-07-04 MED ORDER — LEVETIRACETAM 500 MG PO TABS: 1000.0000 mg | ORAL_TABLET | Freq: Two times a day (BID) | ORAL | Status: DC

## 2020-07-04 MED ORDER — CARBOXYMETHYLCELLULOSE SODIUM 1 % OP SOLN: 1.0000 [drp] | Freq: Two times a day (BID) | OPHTHALMIC | Status: DC

## 2020-07-04 MED ORDER — PHENOBARBITAL 64.8 MG PO TABS
129.6000 mg | ORAL_TABLET | Freq: Every day | ORAL | Status: DC
  Administered 2020-07-05 (×2): 129.6 mg via ORAL
  Filled 2020-07-04 (×4): qty 2

## 2020-07-04 MED ORDER — SENNOSIDES-DOCUSATE SODIUM 8.6-50 MG PO TABS
1.0000 | ORAL_TABLET | Freq: Every day | ORAL | Status: DC
  Administered 2020-07-04 – 2020-07-05 (×2): 1 via ORAL
  Filled 2020-07-04 (×2): qty 1

## 2020-07-04 MED ORDER — PAROXETINE HCL 20 MG PO TABS
40.0000 mg | ORAL_TABLET | Freq: Every day | ORAL | Status: DC
  Administered 2020-07-04 – 2020-07-06 (×3): 40 mg via ORAL
  Filled 2020-07-04 (×4): qty 2

## 2020-07-04 MED ORDER — TORSEMIDE 10 MG PO TABS
10.0000 mg | ORAL_TABLET | Freq: Two times a day (BID) | ORAL | Status: DC
  Administered 2020-07-05 – 2020-07-06 (×4): 10 mg via ORAL
  Filled 2020-07-04 (×6): qty 1

## 2020-07-04 MED ORDER — ACETAMINOPHEN 500 MG PO TABS: 500.0000 mg | ORAL_TABLET | Freq: Four times a day (QID) | ORAL | Status: DC | PRN

## 2020-07-04 MED ORDER — MINERIN CREME EX CREA: 1.0000 "application " | TOPICAL_CREAM | Freq: Two times a day (BID) | CUTANEOUS | Status: DC

## 2020-07-04 MED ORDER — VITAMIN B-12 1000 MCG PO TABS
1000.0000 ug | ORAL_TABLET | Freq: Every day | ORAL | Status: DC
  Administered 2020-07-04 – 2020-07-06 (×3): 1000 ug via ORAL
  Filled 2020-07-04 (×4): qty 1

## 2020-07-04 MED ORDER — SIMVASTATIN 10 MG PO TABS: 20.0000 mg | ORAL_TABLET | Freq: Every day | ORAL | Status: DC

## 2020-07-04 MED ORDER — PHENOBARBITAL 64.8 MG PO TABS: 64.8000 mg | ORAL_TABLET | Freq: Two times a day (BID) | ORAL | Status: DC

## 2020-07-04 MED ORDER — PHENOBARBITAL 64.8 MG PO TABS: 129.6000 mg | ORAL_TABLET | Freq: Every day | ORAL | Status: DC

## 2020-07-04 MED ORDER — PHENOBARBITAL 97.2 MG PO TABS
194.4000 mg | ORAL_TABLET | ORAL | Status: DC
  Administered 2020-07-05: 194.4 mg via ORAL
  Filled 2020-07-04 (×2): qty 2

## 2020-07-04 MED ORDER — BACITRACIN-NEOMYCIN-POLYMYXIN 400-5-5000 EX OINT: 1.0000 "application " | TOPICAL_OINTMENT | Freq: Two times a day (BID) | CUTANEOUS | Status: DC

## 2020-07-04 MED ORDER — BENZTROPINE MESYLATE 1 MG PO TABS
1.0000 mg | ORAL_TABLET | Freq: Two times a day (BID) | ORAL | Status: DC
  Administered 2020-07-04 – 2020-07-06 (×4): 1 mg via ORAL
  Filled 2020-07-04 (×5): qty 1

## 2020-07-04 MED ORDER — FUROSEMIDE 40 MG PO TABS
20.0000 mg | ORAL_TABLET | Freq: Every day | ORAL | Status: DC
  Administered 2020-07-04 – 2020-07-06 (×3): 20 mg via ORAL
  Filled 2020-07-04 (×3): qty 1

## 2020-07-04 MED ORDER — FUROSEMIDE 40 MG PO TABS: 20.0000 mg | ORAL_TABLET | Freq: Every day | ORAL | Status: DC

## 2020-07-04 MED ORDER — OXYBUTYNIN CHLORIDE 5 MG PO TABS: 5.0000 mg | ORAL_TABLET | Freq: Every day | ORAL | Status: DC

## 2020-07-04 MED ORDER — SIMVASTATIN 10 MG PO TABS: 20.0000 mg | ORAL_TABLET | Freq: Every evening | ORAL | Status: DC

## 2020-07-04 MED ORDER — CLONAZEPAM 0.5 MG PO TABS: 0.5000 mg | ORAL_TABLET | Freq: Every day | ORAL | Status: DC

## 2020-07-04 NOTE — ED Notes (Signed)
Hourly rounding reveals patient in room. No complaints, stable, in no acute distress. Q15 minute rounds and monitoring via Rover and Officer to continue.   

## 2020-07-04 NOTE — ED Notes (Signed)
Pt to CT, required to be assisted onto different stretcher to be rolled to CT and back. Back into bed. Attempted to collect pts COVID swab at this time and pt yells "no" "get away from me" "Leave me alone" when swab gets close to face after being explained what was going on. Pt also swinging arms to get swab away and ends up rolling over and placing face In mattress and blanket over head. Will continue to monitor.

## 2020-07-04 NOTE — ED Notes (Signed)
Hourly rounding completed at this time, patient currently asleep in room. No complaints, stable, and in no acute distress. Q15 minute rounds and monitoring via Rover and Officer to continue. 

## 2020-07-04 NOTE — ED Notes (Signed)
Hourly rounding completed at this time, patient currently asleep in room. No complaints, stable, and in no acute distress. Q15 minute rounds and monitoring via Psychologist, counselling to continue. Pt will occasionally wake up when other pt yells and pt will yell back but it is unclear what pt is stating.

## 2020-07-04 NOTE — BH Assessment (Signed)
Referral information for Psychiatric Hospitalization faxed to:   [facility] ([phone])   [facility] [phone]- [phone],    [phone])   [facility] [phone]),   [facility] [phone] or [phone]),  . [facility] [phone] -or- [phone]),  .  [place] [phone]),

## 2020-07-04 NOTE — ED Notes (Signed)
Hourly rounding completed at this time, patient currently asleep in room. Pt has removed her clothing and it is laying in the floor. Pt remains under blanket in bed asleep. No complaints, stable, and in no acute distress. Q15 minute rounds and monitoring via Psychologist, counselling to continue.

## 2020-07-04 NOTE — ED Notes (Signed)
IVC/  PENDING  PLACEMENT 
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
## 2020-07-04 NOTE — Consult Note (Signed)
[facility] Face-to-Face Psychiatry Consult   Reason for Consult:  Outbursts and worsening behaviors at [facility]   Needs [place] Psych admission   Referring Physician:  ED MD    Patient Identification: Christine Pena MRN:  [id number] Principal Diagnosis: <principal problem not specified> Diagnosis:  Active Problems:   * No active hospital problems. *  Dementia with behavioral Disturbance Chronic Schizophrenia  IDD  IED      Total Time spent with patient:  30-40  Subjective:   Christine Pena is a 64 y.o. female patient admitted with downhill course worsening outbursts and IED issues at NH   Attempted to contact bro and NH but no answer  Patient has had waxing and waning course with worsening outbursts, IED issues striking out and behavioral disturbance   More collateral info pending   Patient is too ill and not giving much history       HPI:  As above   Past Psychiatric History: not clear yet   Risk to Self:   risk of clinical deterioration Risk to Others:   hitting others in NH  Prior Inpatient Therapy:  none recently  Prior Outpatient Therapy:  none   Past Medical History:  Past Medical History:  Diagnosis Date  . Anxiety   . CHF (congestive heart failure) (HCC)   . Dementia (HCC)   . Dysphagia   . Hyperlipidemia   . Hypertension   . Mental retardation   . Obesity   . Schizophrenia (HCC)   . 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 Seizures (HCC)   . Vitamin D deficiency    History reviewed. No pertinent surgical history. Family History:  Family History  Problem Relation Age of Onset  . Heart disease Mother    Family Psychiatric  History:  Not known yet  Social History:  Social History   Substance and Sexual Activity  Alcohol Use No     Social History   Substance and Sexual Activity  Drug Use No    Social History   Socioeconomic History  . Marital status: Single    Spouse name: Not on file  . Number of children: 1  . Years of education: Not  on file  . Highest education level: Not on file  Occupational History  . Not on file  Tobacco Use  . Smoking status: Never Smoker  . Smokeless tobacco: Never Used  Substance and Sexual Activity  . Alcohol use: No  . Drug use: No  . Sexual activity: Not Currently    Birth control/protection: Post-menopausal  Other Topics Concern  . Not on file  Social History Narrative   Patient drinks about 5 glasses of tea daily.   Social Determinants of Health   Financial Resource Strain:   . Difficulty of Paying Living Expenses: Not on file  Food Insecurity:   . Worried About Programme researcher, broadcasting/film/video in the Last Year: Not on file  . Ran Out of Food in the Last Year: Not on file  Transportation Needs:   . Lack of Transportation (Medical): Not on file  . Lack of Transportation (Non-Medical): Not on file  Physical Activity:   . Days of Exercise per Week: Not on file  . Minutes of Exercise per Session: Not on file  Stress:   . Feeling of Stress : Not on file  Social Connections:   . Frequency of Communication with Friends and Family: Not on file  . Frequency of Social Gatherings with Friends and Family: Not on  file  . Attends Religious Services: Not on file  . Active Member of Clubs or Organizations: Not on file  . Attends BankerClub or Organization Meetings: Not on file  . Marital Status: Not on file   Additional Social History:    Allergies:   Allergies  Allergen Reactions  . Depakote [Valproic Acid] Other (See Comments)    altered mental status/ high ammonia  . Haldol [Haloperidol Lactate] Other (See Comments)    Unknown reaction.    Labs:  Results for orders placed or performed during the hospital encounter of 07/03/20 (from the past 48 hour(s))  Comprehensive metabolic panel     Status: Abnormal   Collection Time: 07/03/20  7:23 PM  Result Value Ref Range   Sodium 141 135 - 145 mmol/L   Potassium 4.4 3.5 - 5.1 mmol/L   Chloride 102 98 - 111 mmol/L   CO2 28 22 - 32 mmol/L    Glucose, Bld 86 70 - 99 mg/dL    Comment: Glucose reference range applies only to samples taken after fasting for at least 8 hours.   BUN 24 (H) 8 - 23 mg/dL   Creatinine, Ser 9.810.66 0.44 - 1.00 mg/dL   Calcium 9.2 8.9 - 19.110.3 mg/dL   Total Protein 7.1 6.5 - 8.1 g/dL   Albumin 4.0 3.5 - 5.0 g/dL   AST 17 15 - 41 U/L   ALT 14 0 - 44 U/L   Alkaline Phosphatase 60 38 - 126 U/L   Total Bilirubin 0.5 0.3 - 1.2 mg/dL   GFR, Estimated >47>60 >82>60 mL/min   Anion gap 11 5 - 15    Comment: Performed at [facility], [address] [place], [place] [address]  Ethanol     Status: None   Collection Time: 07/03/20  7:23 PM  Result Value Ref Range   Alcohol, Ethyl (B) <10 <10 mg/dL    Comment: (NOTE) Lowest detectable limit for serum alcohol is 10 mg/dL.  For medical purposes only. Performed at [facility], [address] [place], [place] [address]   Salicylate level     Status: Abnormal   Collection Time: 07/03/20  7:23 PM  Result Value Ref Range   Salicylate Lvl <7.0 (L) 7.0 - 30.0 mg/dL    Comment: Performed at [facility], [address] [place], [place] [address]  Acetaminophen level     Status: Abnormal   Collection Time: 07/03/20  7:23 PM  Result Value Ref Range   Acetaminophen (Tylenol), Serum <10 (L) 10 - 30 ug/mL    Comment: (NOTE) Therapeutic concentrations vary significantly. A range of 10-30 ug/mL  may be an effective concentration for many patients. However, some  are best treated at concentrations outside of this range. Acetaminophen concentrations >150 ug/mL at 4 hours after ingestion  and >50 ug/mL at 12 hours after ingestion are often associated with  toxic reactions.  Performed at [facility], [address] [place], [place] [address]   cbc     Status: Abnormal   Collection Time: 07/03/20  7:23 PM  Result Value Ref Range   WBC 6.9 4.0 - 10.5 K/uL   RBC 3.60 (L) 3.87 - 5.11 MIL/uL   Hemoglobin 10.9 (L) 12.0 - 15.0 g/dL    HCT 84.132.6 (L) 36 - 46 %   MCV 90.6 80.0 - 100.0 fL   MCH 30.3 26.0 - 34.0 pg   MCHC 33.4 30.0 - 36.0 g/dL   RDW 32.412.3 40.111.5 - 02.715.5 %  Platelets 240 150 - 400 K/uL   nRBC 0.0 0.0 - 0.2 %    Comment: Performed at [facility], [address] [place], [place] [address]    Current Facility-Administered Medications  Medication Dose Route Frequency Provider Last Rate Last Admin  . acetaminophen (TYLENOL) tablet 500 mg  500 mg Oral Q6H PRN Roselind Messier, MD      . alum & mag hydroxide-simeth (MAALOX/MYLANTA) 200-200-20 MG/5ML suspension 30 mL  30 mL Oral Q6H PRN Roselind Messier, MD      . benztropine (COGENTIN) tablet 1 mg  1 mg Oral BID Roselind Messier, MD      . carboxymethylcellulose 1 % ophthalmic solution 1 drop  1 drop Both Eyes BID Roselind Messier, MD      . chlorhexidine (PERIDEX) 0.12 % solution 20 mL  20 mL Mouth/Throat BID Roselind Messier, MD      . clonazePAM Scarlette Calico) tablet 2 mg  2 mg Oral BID Dionne Bucy, MD      . furosemide (LASIX) tablet 20 mg  20 mg Oral Daily Dionne Bucy, MD      . guaifenesin (ROBITUSSIN) 100 MG/5ML syrup 200 mg  200 mg Oral Q6H PRN Roselind Messier, MD      . levETIRAcetam (KEPPRA) tablet 1,500 mg  1,500 mg Oral BID Roselind Messier, MD      . lisinopril (ZESTRIL) tablet 10 mg  10 mg Oral Daily Roselind Messier, MD      . loperamide (IMODIUM) capsule 2 mg  2 mg Oral PRN Roselind Messier, MD      . magnesium hydroxide (MILK OF MAGNESIA) suspension 30 mL  30 mL Oral QHS PRN Roselind Messier, MD      . Minerin Creme CREA 1 application  1 application Topical BID Roselind Messier, MD      . neomycin-bacitracin-polymyxin (NEOSPORIN) ointment packet 1 application  1 application Topical BID Roselind Messier, MD      . oxybutynin (DITROPAN) tablet 5 mg  5 mg Oral Daily Dionne Bucy, MD      . pantoprazole (PROTONIX) EC tablet 40 mg  40 mg Oral Daily Roselind Messier, MD      . PARoxetine (PAXIL) tablet 40 mg  40 mg Oral Daily  Dionne Bucy, MD      . PHENobarbital (LUMINAL) tablet 129.6 mg  129.6 mg Oral QHS Roselind Messier, MD      . PHENobarbital (LUMINAL) tablet 194.4 mg  194.4 mg Oral Particia Nearing, MD      . Propylene Glycol-Glycerin 1-0.3 % SOLN 2 drop  2 drop Both Eyes Q4H PRN Roselind Messier, MD      . QUEtiapine (SEROQUEL) tablet 200 mg  200 mg Oral QHS Dionne Bucy, MD      . senna-docusate (Senokot-S) tablet 1 tablet  1 tablet Oral QHS Roselind Messier, MD      . simvastatin (ZOCOR) tablet 20 mg  20 mg Oral QPM Roselind Messier, MD      . thiothixene (NAVANE) capsule 1 mg  1 mg Oral TID Roselind Messier, MD      . torsemide [facility]) tablet 10 mg  10 mg Oral BID Roselind Messier, MD      . vitamin B-12 (CYANOCOBALAMIN) tablet 1,000 mcg  1,000 mcg Oral Daily Roselind Messier, MD      . Vitamin D3 TABS 5,000 Units  5,000 Units Oral Daily Roselind Messier, MD      . Melene Muller ON 07/05/2020] zonisamide (ZONEGRAN) capsule 50 mg  50 mg Oral  Daily Dionne Bucy, MD       Current Outpatient Medications  Medication Sig Dispense Refill  . acetaminophen (TYLENOL) 500 MG tablet Take 500-1,000 mg by mouth every 8 (eight) hours as needed for mild pain or fever. For left foot pain    . benztropine (COGENTIN) 1 MG tablet Take 1 mg by mouth 2 (two) times daily.    . Cholecalciferol (VITAMIN D3) 125 MCG (5000 UT) TABS Take 5,000 Units by mouth daily.    . clonazePAM (KLONOPIN) 2 MG tablet Take 2 mg by mouth in the morning and at bedtime.     . cyanocobalamin 1000 MCG tablet Take 1,000 mcg by mouth daily.    . furosemide (LASIX) 20 MG tablet Take 20 mg by mouth daily.    Marland Kitchen levETIRAcetam (KEPPRA) 750 MG tablet Take 2 tablets (1,500 mg total) by mouth 2 (two) times daily. (Patient taking differently: Take 1,000 mg by mouth 2 (two) times daily. )    . lisinopril (PRINIVIL,ZESTRIL) 10 MG tablet Take 10 mg by mouth daily.     Marland Kitchen loperamide (IMODIUM) 2 MG capsule Take 2 mg by mouth every 6 (six)  hours as needed for diarrhea or loose stools.     . magnesium hydroxide (MILK OF MAGNESIA) 400 MG/5ML suspension Take 30 mLs by mouth every 6 (six) hours as needed for mild constipation.     Marland Kitchen omeprazole (PRILOSEC) 20 MG capsule Take 20 mg by mouth at bedtime.    Marland Kitchen oxybutynin (DITROPAN) 5 MG tablet Take 5 mg by mouth daily.    Marland Kitchen PARoxetine (PAXIL) 40 MG tablet Take 40 mg by mouth daily.    Marland Kitchen PHENobarbital (LUMINAL) 64.8 MG tablet Take 129.6 mg by mouth at bedtime.    Marland Kitchen PHENobarbital (LUMINAL) 97.2 MG tablet Take 194.4 mg by mouth every other day.     Marland Kitchen Propylene Glycol-Glycerin (ARTIFICIAL TEARS) 1-0.3 % SOLN Place 2 drops into both eyes every 4 (four) hours as needed (dryness).    . QUEtiapine (SEROQUEL) 200 MG tablet Take 200 mg by mouth at bedtime.     . senna-docusate (SENOKOT-S) 8.6-50 MG tablet Take 1 tablet by mouth at bedtime.     . simvastatin (ZOCOR) 20 MG tablet Take 20 mg by mouth every evening.    . torsemide (DEMADEX) 10 MG tablet Take 10 mg by mouth 2 (two) times daily.    Marland Kitchen zonisamide (ZONEGRAN) 50 MG capsule Take 50 mg by mouth daily.       Musculoskeletal: Strength & Muscle Tone: cannot assess  Gait & Station:  Lying Pena --not known  Patient leans: n/a   Psychiatric Specialty Exam: Physical Exam  Review of Systems  Blood pressure (!) 122/56, pulse 61, temperature 98.6 F (37 C), temperature source Oral, resp. rate 20, height  (1.626 m), SpO2 100 %.Body mass index is 37.76 kg/m.    Mental Status   Very limited  Patient is oriented to name only  She is strange and odd Thought process and content ---LOA not making sense  Sense -- Consciousness not clouded or fluctuant Rapport eye contact and all poor Internally distracted  No shakes tics tremors   Otherwise limited MS at this time  Treatment Plan Summary:  Patient with severe IED and issues with outbursts and psychosis  --NH cannot handle at this time   Needs To await gero psych bed    Meds for Psych modified at this time to help with her issues   Await collateral from NH and brother   Messages left      Disposition: Awaits [facility] [place] [facility] psych admission   Roselind Messier, MD 07/04/2020 11:54 AM

## 2020-07-04 NOTE — ED Notes (Signed)
Patient starlight cath for urine specimen, 50ml of cloudy urine obtained and sent to lab. Covid swab also obtained and walked over to lab.

## 2020-07-04 NOTE — ED Notes (Signed)
Report to include Situation, Background, Assessment, and Recommendations received from Jeannette RN. Patient alert and oriented, warm and dry, in no acute distress. UTA SI, HI, AVH and pain. Patient made aware of Q15 minute rounds and Rover and Officer presence for their safety. Patient instructed to come to me with needs or concerns.   

## 2020-07-04 NOTE — ED Notes (Signed)
Pt given a meal tray 

## 2020-07-04 NOTE — ED Notes (Signed)
Patient refused to put clothes on and refused for me to put them on, patient stating 'NO'

## 2020-07-05 ENCOUNTER — Emergency Department: Payer: Medicare Other

## 2020-07-05 DIAGNOSIS — R451 Restlessness and agitation: Secondary | ICD-10-CM | POA: Diagnosis not present

## 2020-07-05 LAB — TROPONIN I (HIGH SENSITIVITY): Troponin I (High Sensitivity): 3 ng/L (ref <18)

## 2020-07-05 MED ORDER — CEPHALEXIN 500 MG PO CAPS
500.0000 mg | ORAL_CAPSULE | Freq: Three times a day (TID) | ORAL | Status: DC
  Administered 2020-07-05 – 2020-07-06 (×4): 500 mg via ORAL
  Filled 2020-07-05 (×3): qty 1

## 2020-07-05 NOTE — ED Notes (Signed)
Hourly rounding reveals patient in room. No complaints, stable, in no acute distress. Q15 minute rounds and monitoring via Rover and Officer to continue.   

## 2020-07-05 NOTE — BH Assessment (Signed)
Spoke with ED secretary Bonita Quin regarding pt's updated IVC paperwork. Per Bonita Quin, sheriff's department has yet to bring corrected forms. Spoke with Leotis Shames of [facility] [phone]) to update her on the pt's IVC paperwork status.

## 2020-07-05 NOTE — BH Assessment (Signed)
Referral information for Psychiatric Hospitalization faxed to:   Earlene Plater ([phone]) Left voicemail requesting a call back.    [facility] [phone]- [phone], [phone]) Asher Muir requested a refax; task completed at 1050 PM.    [facility] [phone]), Darl Pikes requested a refax; Task completed at 10:45 PM.   [facility] [phone] or [phone]), Per Leotis Shames, awaiting updated IVC paperwork.  [facility] [phone] -or- [phone]), Per Clydie Braun, Gero/adolescent floors currently full. Suggested TTS call back in the morning to inquire about discharges.   [facility] [phone]), No answer

## 2020-07-05 NOTE — ED Notes (Signed)
Dinner provided.

## 2020-07-05 NOTE — BH Assessment (Signed)
Referral information for Psychiatric Hospitalization faxed to:   [facility] ([phone]---[phone])   [facility] [phone]- [phone],    [phone])   [facility] [phone]),   [facility] [phone] or [phone]), Admissions staff requesting Covid, UA, UDS, Ekg, and Chest Xray (Orders placed for Ekg but have not completed yet in the ED)  . [facility] [phone]- [phone]),  .  [facility] [phone]),

## 2020-07-05 NOTE — ED Notes (Signed)
EKG and troponin obtained and sent to lab

## 2020-07-05 NOTE — ED Notes (Signed)
INVOLUNTARY/awaiting placement °

## 2020-07-05 NOTE — ED Provider Notes (Signed)
UA + for many bacteria, hematuria on cath sample. UCx sent. Pt has some mild dysuria sx. Will treat with Keflex TID x 5 days.   Shaune Pollack, MD 07/05/20 [phone]

## 2020-07-05 NOTE — ED Notes (Signed)
Patient was changed

## 2020-07-05 NOTE — ED Notes (Signed)
Patient incontinent of bladder, peri care and clean linen provided.

## 2020-07-06 DIAGNOSIS — R451 Restlessness and agitation: Secondary | ICD-10-CM | POA: Diagnosis not present

## 2020-07-06 LAB — URINE CULTURE: Culture: >=100000 — AB

## 2020-07-06 NOTE — ED Provider Notes (Signed)
Emergency Medicine Observation Re-evaluation Note  Christine Pena is a 64 y.o. female, seen on rounds today.  Pt initially presented to the ED for complaints of Psychiatric Evaluation Currently, the patient is, no complaints this morning.  Physical Exam  BP 127/61 (BP Location: Right Arm)   Pulse (!) 54   Temp (!) 96.5 F (35.8 C) (Oral)   Resp 18   Ht 5\' 4"  (1.626 m)   SpO2 96%   BMI 37.76 kg/m  Physical Exam General: No apparent distress HEENT: moist mucous membranes CV: RRR Pulm: Normal WOB GI: soft and non tender MSK: no edema or cyanosis Neuro: face symmetric, moving all extremities    ED Course / MDM  I have reviewed the labs performed to date as well as medications administered while in observation.  No acute changes overnight or new labs this morning.  Plan  Current plan is for placement. , Don Perking, MD 07/06/20 [phone]

## 2020-07-06 NOTE — ED Notes (Signed)
Pt given breakfast tray

## 2020-07-06 NOTE — BH Assessment (Addendum)
Patient has been accepted to [facility].  Patient assigned to room 420 Accepting physician is Dr. Lowanda Foster.  Call report to [phone].  Representative was [facility].   ER Staff is aware of it: Nitchia, ER Secretary  Dr. Elige Radon, ER MD  Tresa Endo, Patient's Nurse     Writer called and left a HIPPA Compliant message with brother [name] [phone]), requesting a return phone call.  Care Facility, [facility] [phone]) have been updated as well.  Address: [address]  [place], [place] [address]

## 2020-07-06 NOTE — ED Notes (Signed)
This RN at bedside. Pt yelling and trying to rip shirt off. This RN reminded pt that she needed to keep her shirt on. Pt continues yelling at this time.

## 2020-07-06 NOTE — ED Notes (Signed)
This RN rounding on pt. Pt had pulled apart chux pad and stuffing was in the floor. This RN cleaned room at this time and removed extra chux pads.

## 2020-07-06 NOTE — ED Notes (Signed)
Attempted to call report. Facility hung up on this RN

## 2020-07-06 NOTE — ED Notes (Signed)
Pt had taken scrub top off. New blue scrub top and brief placed on pt at this time

## 2020-07-06 NOTE — ED Notes (Signed)
Pt taking scrub top and brief off. Pt with legs open in the air. Pt directed to place shirt back on. Pt provided with new brief and scrub pants at this time.

## 2020-07-06 NOTE — ED Notes (Signed)
This Building control surveyor at bedside. Pt with notable bowel movement on self, bed and floor. Pt cleaned, new brief and socks in place. Bed and floor cleaned. New sheet placed on bed.

## 2021-09-23 DEATH — deceased
